# Patient Record
Sex: Female | Born: 1965 | Hispanic: Yes | Marital: Single | Smoking: Never smoker
Health system: Southern US, Community
[De-identification: ages and names within clinical notes are randomized; demographics above are authoritative.]

## PROBLEM LIST (undated history)

## (undated) DIAGNOSIS — I959 Hypotension, unspecified: Secondary | ICD-10-CM

## (undated) DIAGNOSIS — E119 Type 2 diabetes mellitus without complications: Secondary | ICD-10-CM

## (undated) DIAGNOSIS — I10 Essential (primary) hypertension: Secondary | ICD-10-CM

## (undated) DIAGNOSIS — C801 Malignant (primary) neoplasm, unspecified: Secondary | ICD-10-CM

## (undated) HISTORY — PX: ABDOMINAL HYSTERECTOMY: SHX81

---

## 2011-05-04 ENCOUNTER — Emergency Department: Payer: Self-pay | Admitting: Emergency Medicine

## 2011-09-03 ENCOUNTER — Emergency Department: Payer: Self-pay | Admitting: Emergency Medicine

## 2013-06-13 ENCOUNTER — Emergency Department: Payer: Self-pay | Admitting: Internal Medicine

## 2013-06-13 LAB — URINALYSIS, COMPLETE
Bilirubin,UR: NEGATIVE
Blood: NEGATIVE
Glucose,UR: >=500 mg/dL (ref 0–75)
Nitrite: NEGATIVE
Ph: 5 (ref 4.5–8.0)

## 2013-06-14 ENCOUNTER — Emergency Department: Payer: Self-pay | Admitting: Emergency Medicine

## 2013-07-31 ENCOUNTER — Emergency Department: Payer: Self-pay | Admitting: Emergency Medicine

## 2013-07-31 LAB — URINALYSIS, COMPLETE
Bilirubin,UR: NEGATIVE
Blood: NEGATIVE
Glucose,UR: >=500 mg/dL (ref 0–75)
Leukocyte Esterase: NEGATIVE
Nitrite: NEGATIVE
Ph: 6 (ref 4.5–8.0)
Protein: 30
RBC,UR: 1 /HPF (ref 0–5)
Specific Gravity: 1.024 (ref 1.003–1.030)
WBC UR: 4 /HPF (ref 0–5)

## 2013-07-31 LAB — COMPREHENSIVE METABOLIC PANEL
Albumin: 3 g/dL — ABNORMAL LOW (ref 3.4–5.0)
Anion Gap: 5 — ABNORMAL LOW (ref 7–16)
Chloride: 101 mmol/L (ref 98–107)
Co2: 30 mmol/L (ref 21–32)
Creatinine: 0.28 mg/dL — ABNORMAL LOW (ref 0.60–1.30)
EGFR (African American): >60
EGFR (Non-African Amer.): >60
Potassium: 4 mmol/L (ref 3.5–5.1)
SGOT(AST): 12 U/L — ABNORMAL LOW (ref 15–37)
SGPT (ALT): 18 U/L (ref 12–78)
Sodium: 136 mmol/L (ref 136–145)
Total Protein: 7.6 g/dL (ref 6.4–8.2)

## 2013-07-31 LAB — CBC
HCT: 36.5 % (ref 35.0–47.0)
HGB: 12.9 g/dL (ref 12.0–16.0)
MCH: 30.1 pg (ref 26.0–34.0)
MCHC: 35.3 g/dL (ref 32.0–36.0)
Platelet: 437 10*3/uL (ref 150–440)
WBC: 9 10*3/uL (ref 3.6–11.0)

## 2013-07-31 IMAGING — US ABDOMEN ULTRASOUND LIMITED
1 series · 14 of 25 positions shown · non-contrast
Comparison: none

REASON FOR EXAM: RUQ pain
COMMENTS:   Body Site: GB and Fossa, CBD, Head of Pancreas

[Series 1: abdomen ultrasound limited · 0.33mm/px · 14 of 62 slices shown]
[im 1/62]
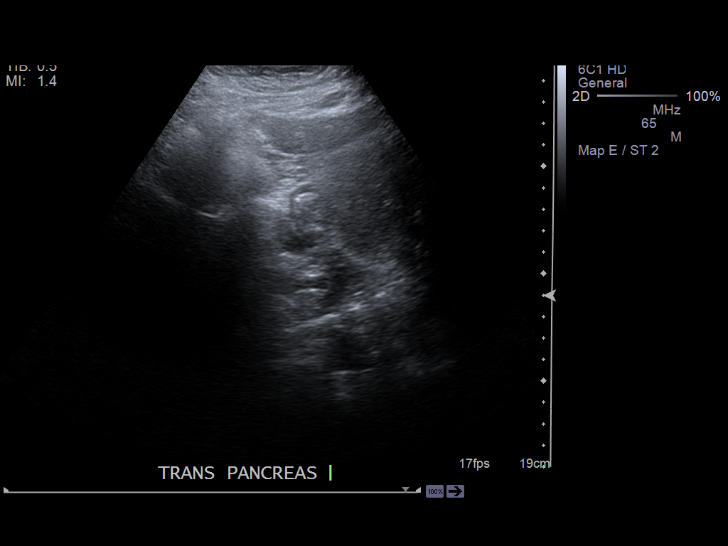
[im 6/62]
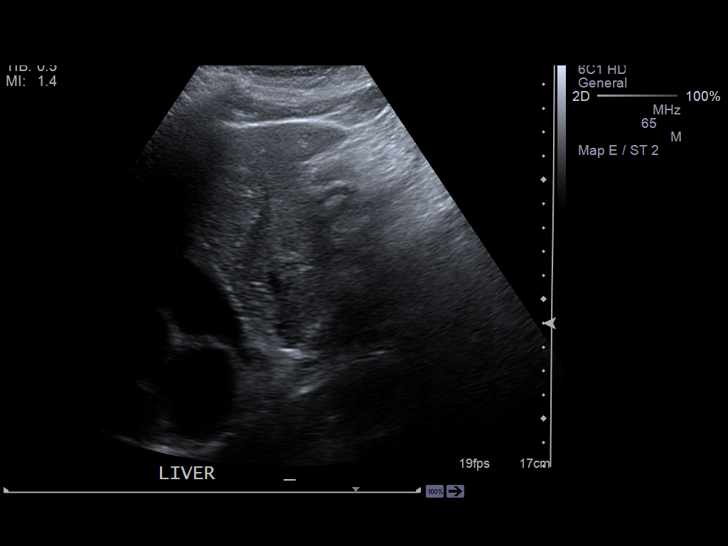
[im 11/62]
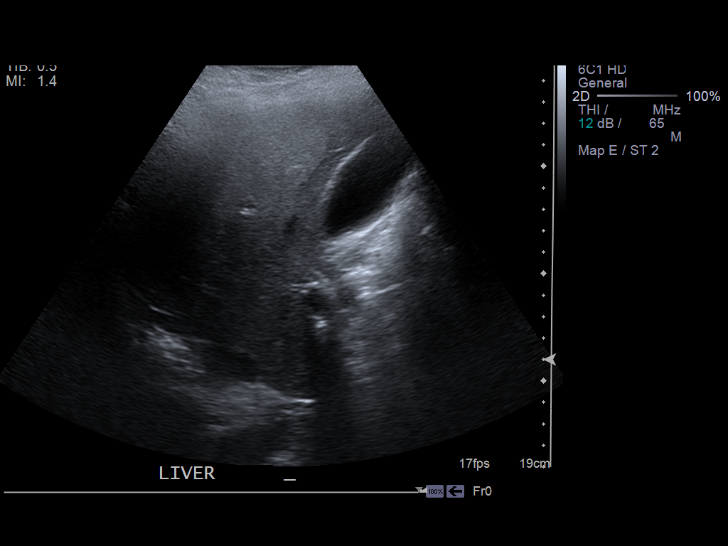
[im 16/62]
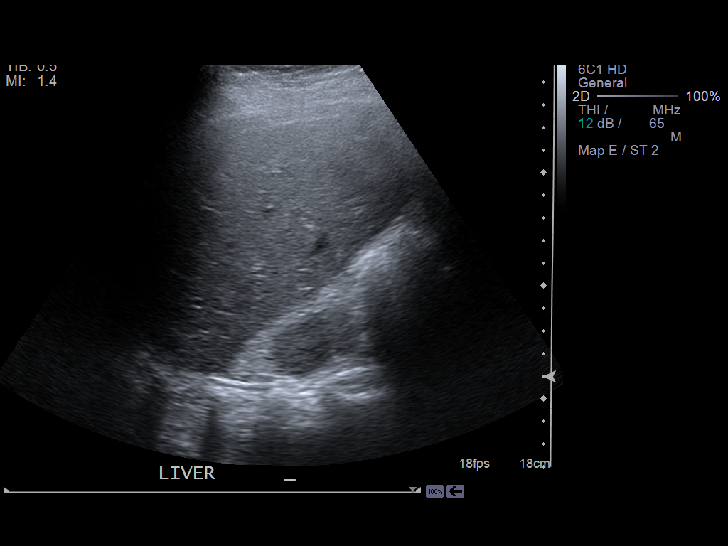
[im 21/62]
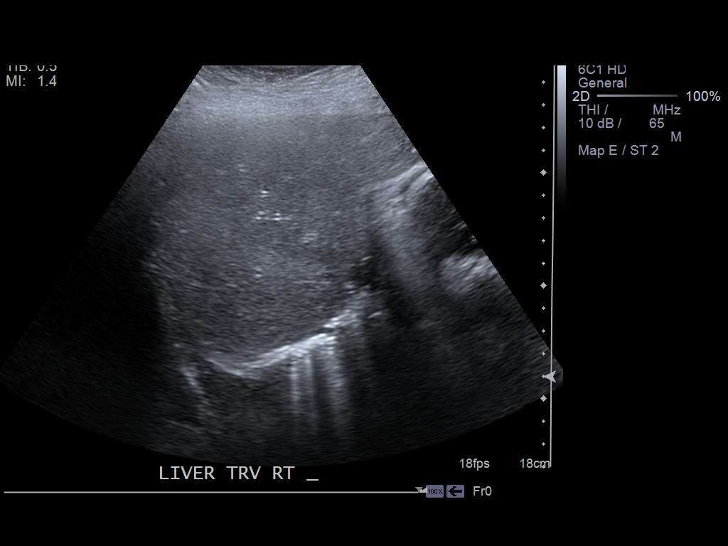
[im 23/62]
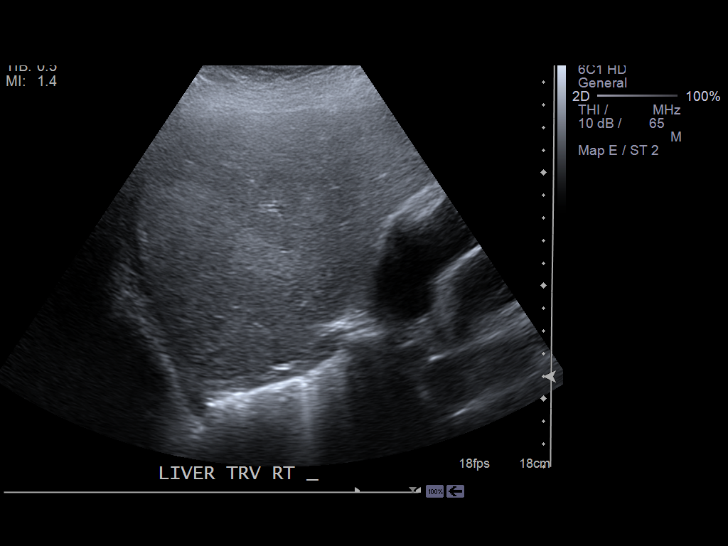
[im 28/62]
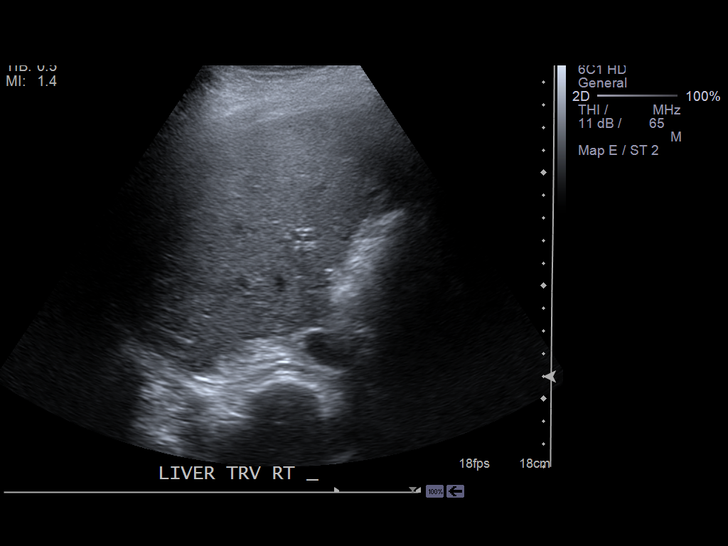
[im 34/62]
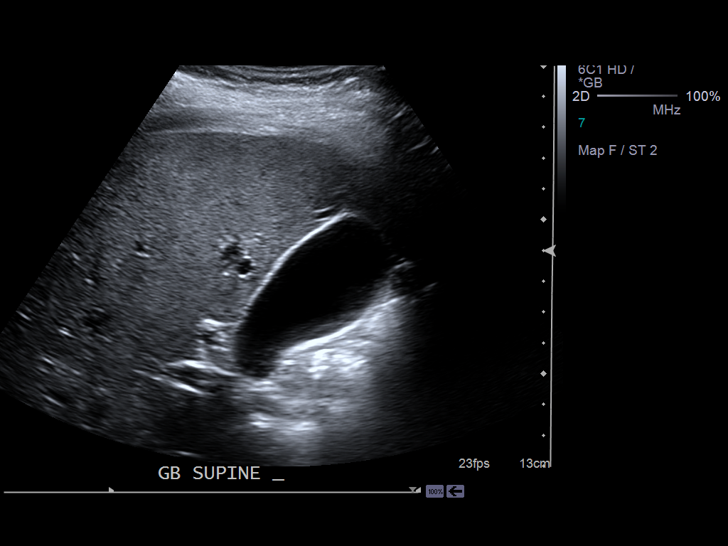
[im 39/62]
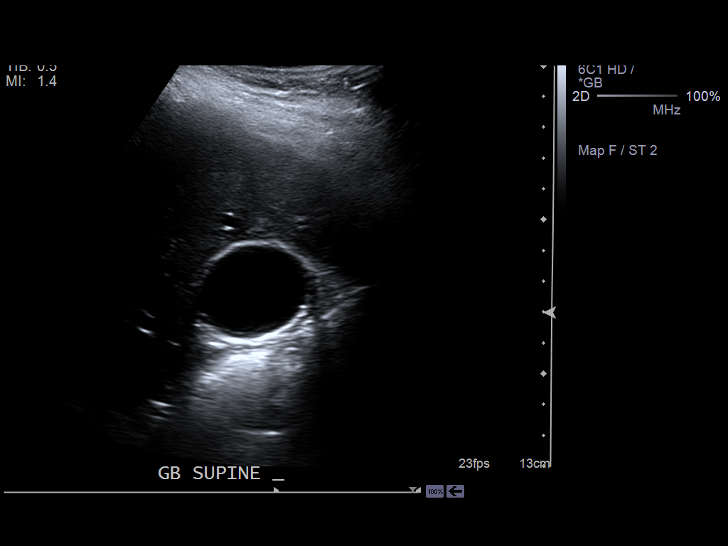
[im 41/62]
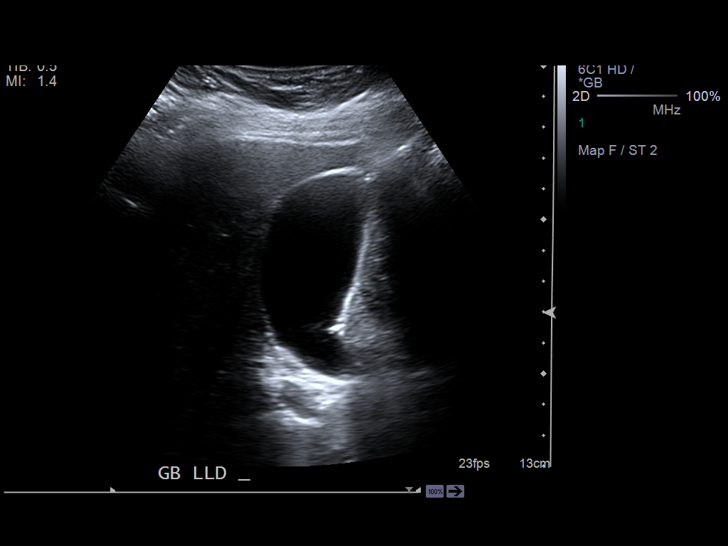
[im 46/62]
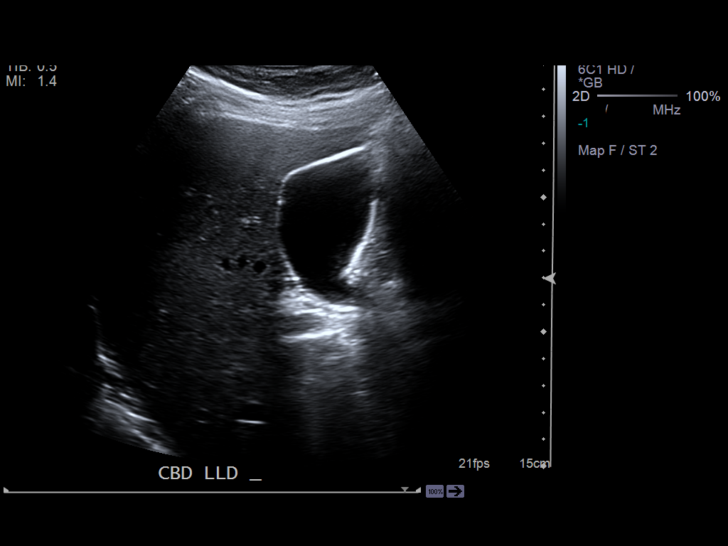
[im 51/62]
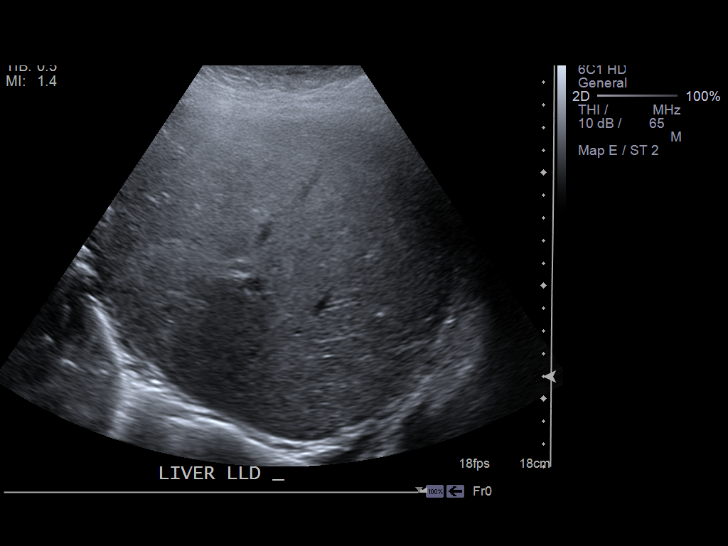
[im 56/62]
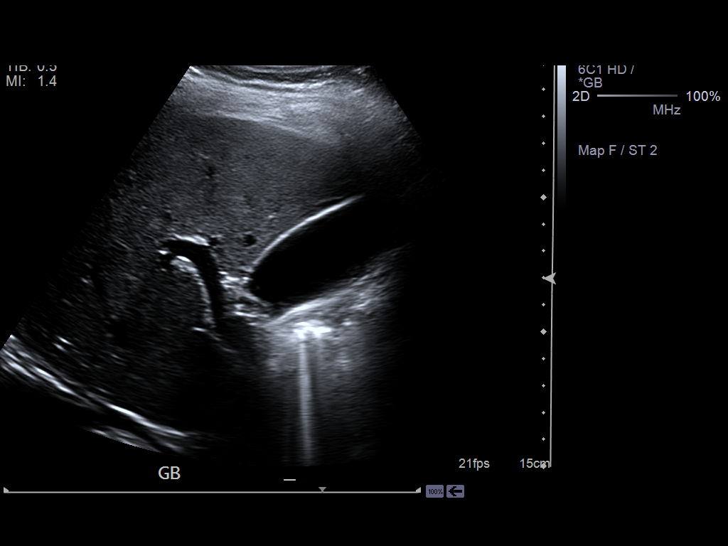
[im 62/62]
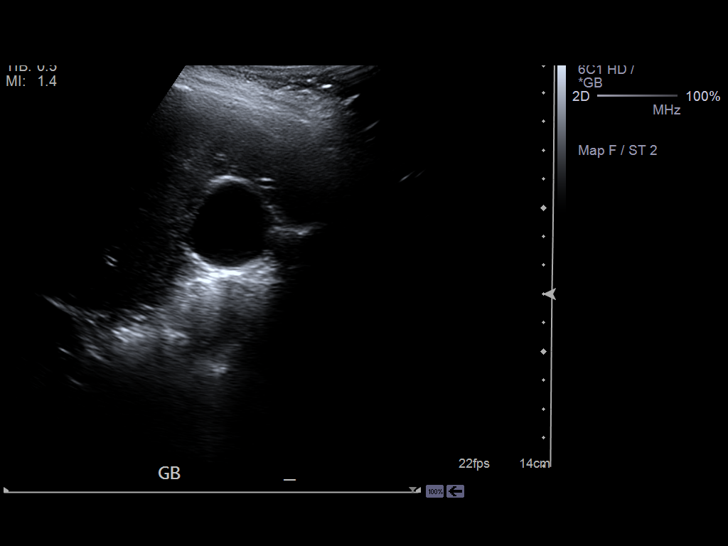

[14 of 25 positions shown; findings below may reference images not displayed]

PROCEDURE:     US  - US ABDOMEN LIMITED SURVEY  - July 31, 2013 [DATE]

RESULT:     A limited upper abdominal ultrasound was performed.

The liver exhibits normal echotexture with no focal mass or ductal dilation.
Portal venous flow is normal in direction toward the liver. Bowel gas limits
evaluation of the pancreas but the visualized portions of the pancreas
appear normal. The gallbladder is adequately distended with no evidence of
stones, wall thickening, or pericholecystic fluid. There is no positive
sonographic Murphy's sign. The common bile duct is normal at 2.3 mm in
diameter. There is a small right pleural effusion.
IMPRESSION: 1. The liver and gallbladder are normal in appearance. The common bile duct
is normal in caliber.
2. There is limited evaluation of the pancreas.
3. There is a small right pleural effusion.

[REDACTED]

## 2013-07-31 IMAGING — CR DG CHEST 2V
1 series · 2 of 2 positions shown · non-contrast
Comparison: none

REASON FOR EXAM: cough
COMMENTS:

[Series 1: pa · 0.17mm/px · 2 of 2 slices shown]
[im 1/2]
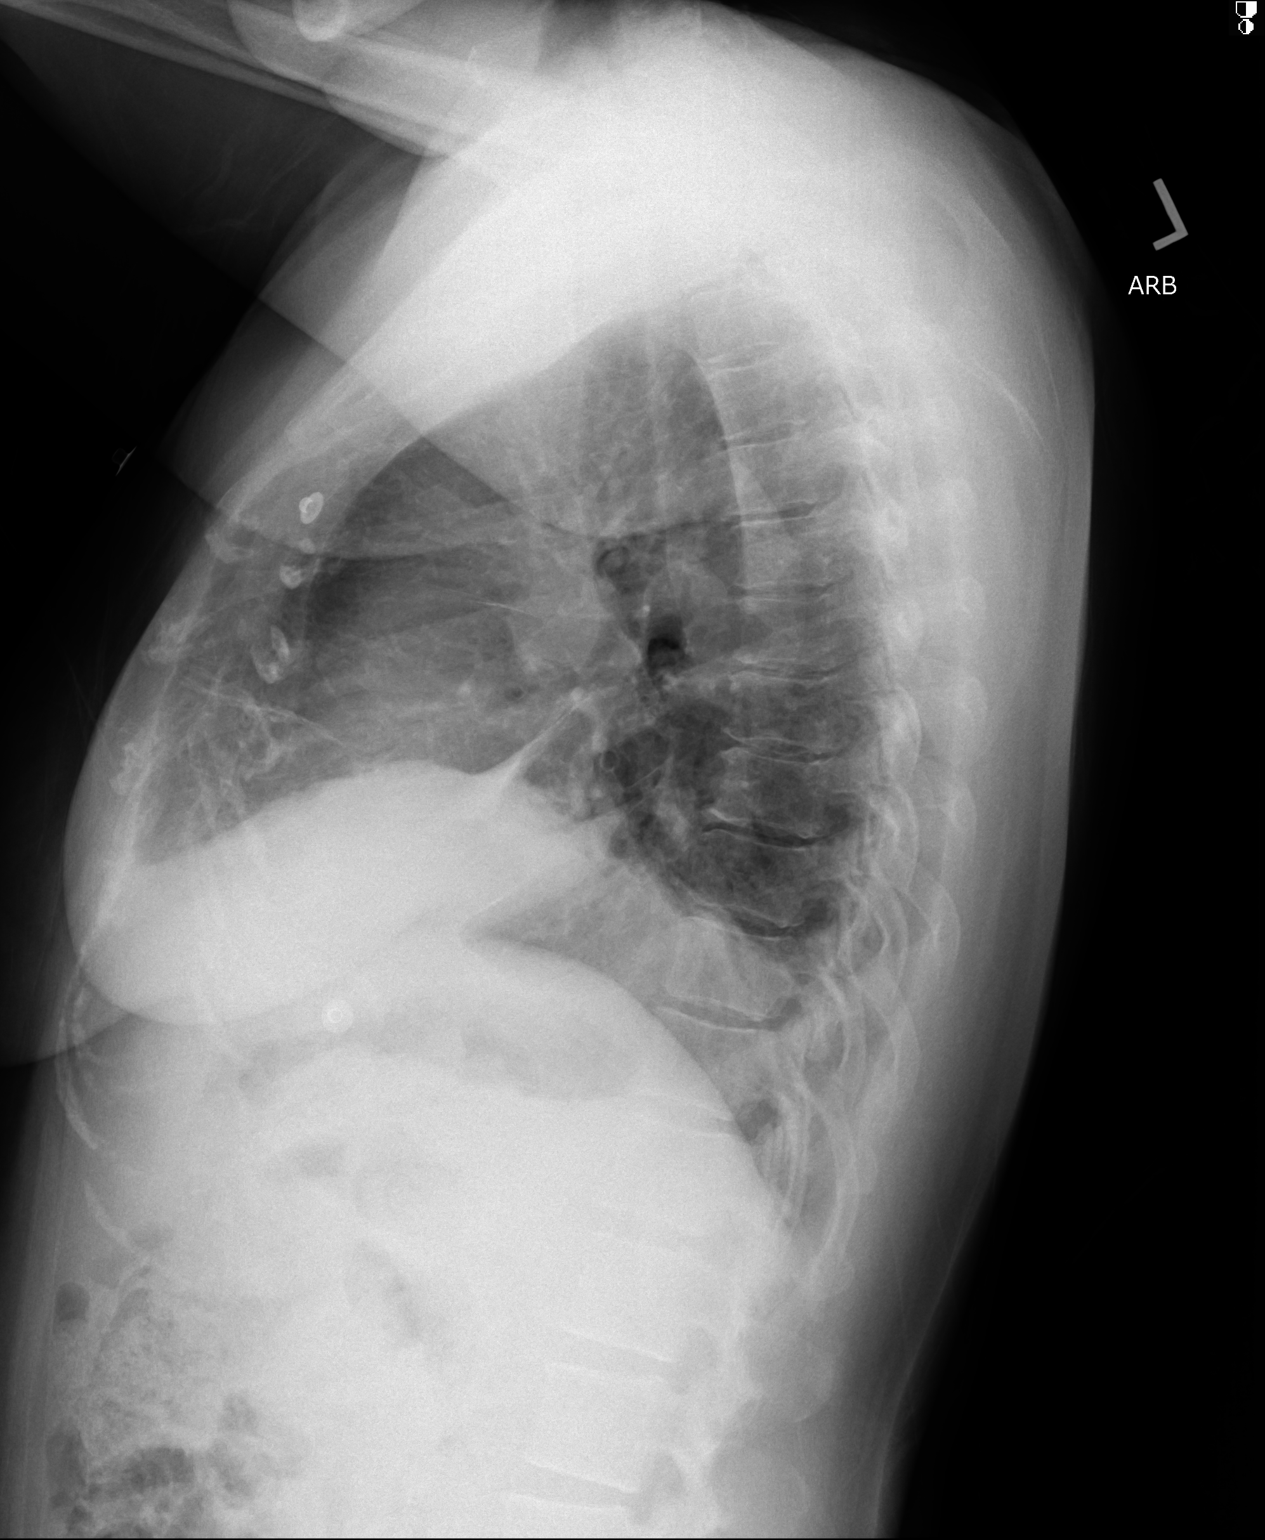
[im 2/2]
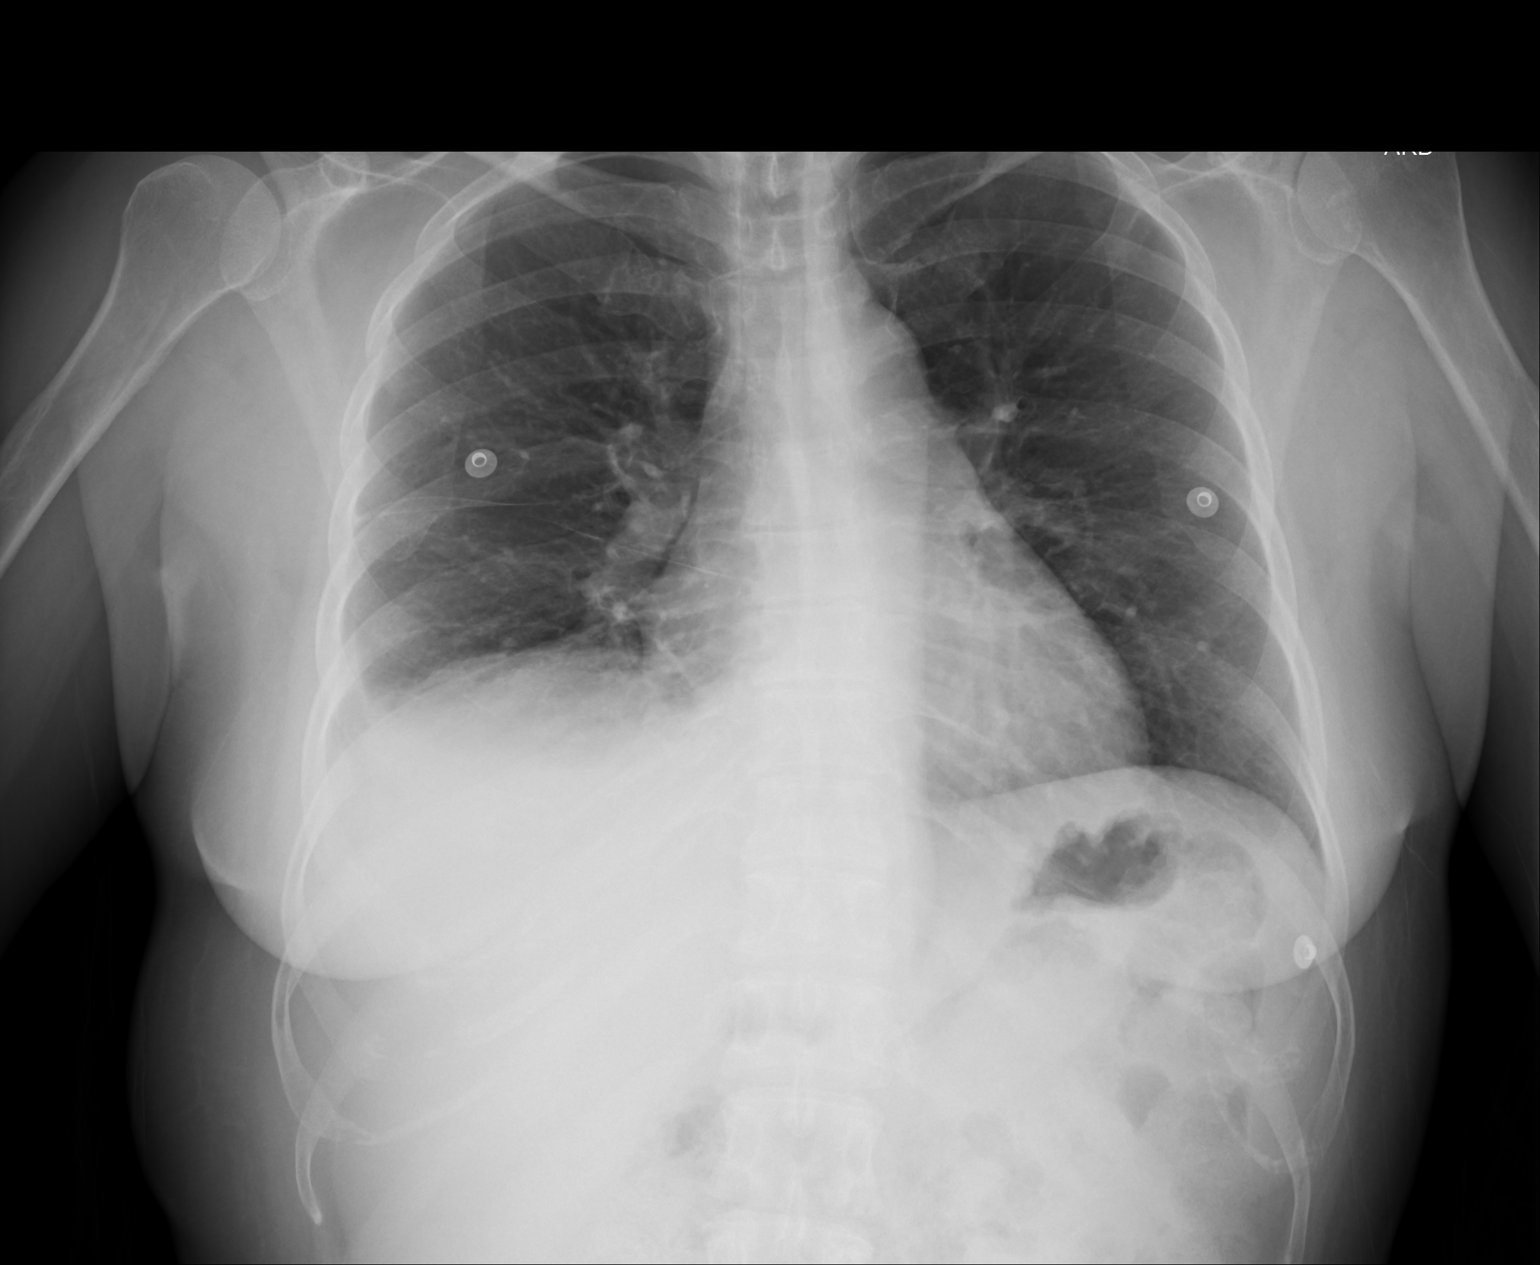

[2 of 2 positions shown; findings below may reference images not displayed]

PROCEDURE:     DXR - DXR CHEST PA (OR AP) AND LATERAL  - July 31, 2013 [DATE]

RESULT:     The left lung is clear. On the right there is increased density
at the lung base with blunting of the costophrenic gutters. The cardiac
silhouette is normal in size. The pulmonary vascularity is not engorged. The
observed portions of the bony thorax appear normal.
IMPRESSION: The findings are consistent with a small right pleural
effusion. I cannot exclude left basilar atelectasis or pneumonia. Followup
films following therapy are recommended. Chest CT scanning may be useful if
there are clinical concerns possible acute pulmonary embolism.

[REDACTED]

## 2013-08-03 ENCOUNTER — Emergency Department: Payer: Self-pay | Admitting: Emergency Medicine

## 2013-08-03 IMAGING — CR DG CHEST 2V
1 series · 2 of 2 positions shown · non-contrast
Comparison: none

REASON FOR EXAM: recent pleural effusion - eval for change - ongoing R
chest pain.
COMMENTS:

PROCEDURE:     DXR - DXR CHEST PA (OR AP) AND LATERAL  - August 03, 2013  [DATE]
RESULT:     Comparison: None

[Series 1: pa · 0.17mm/px · 2 of 2 slices shown]
[im 1/2]
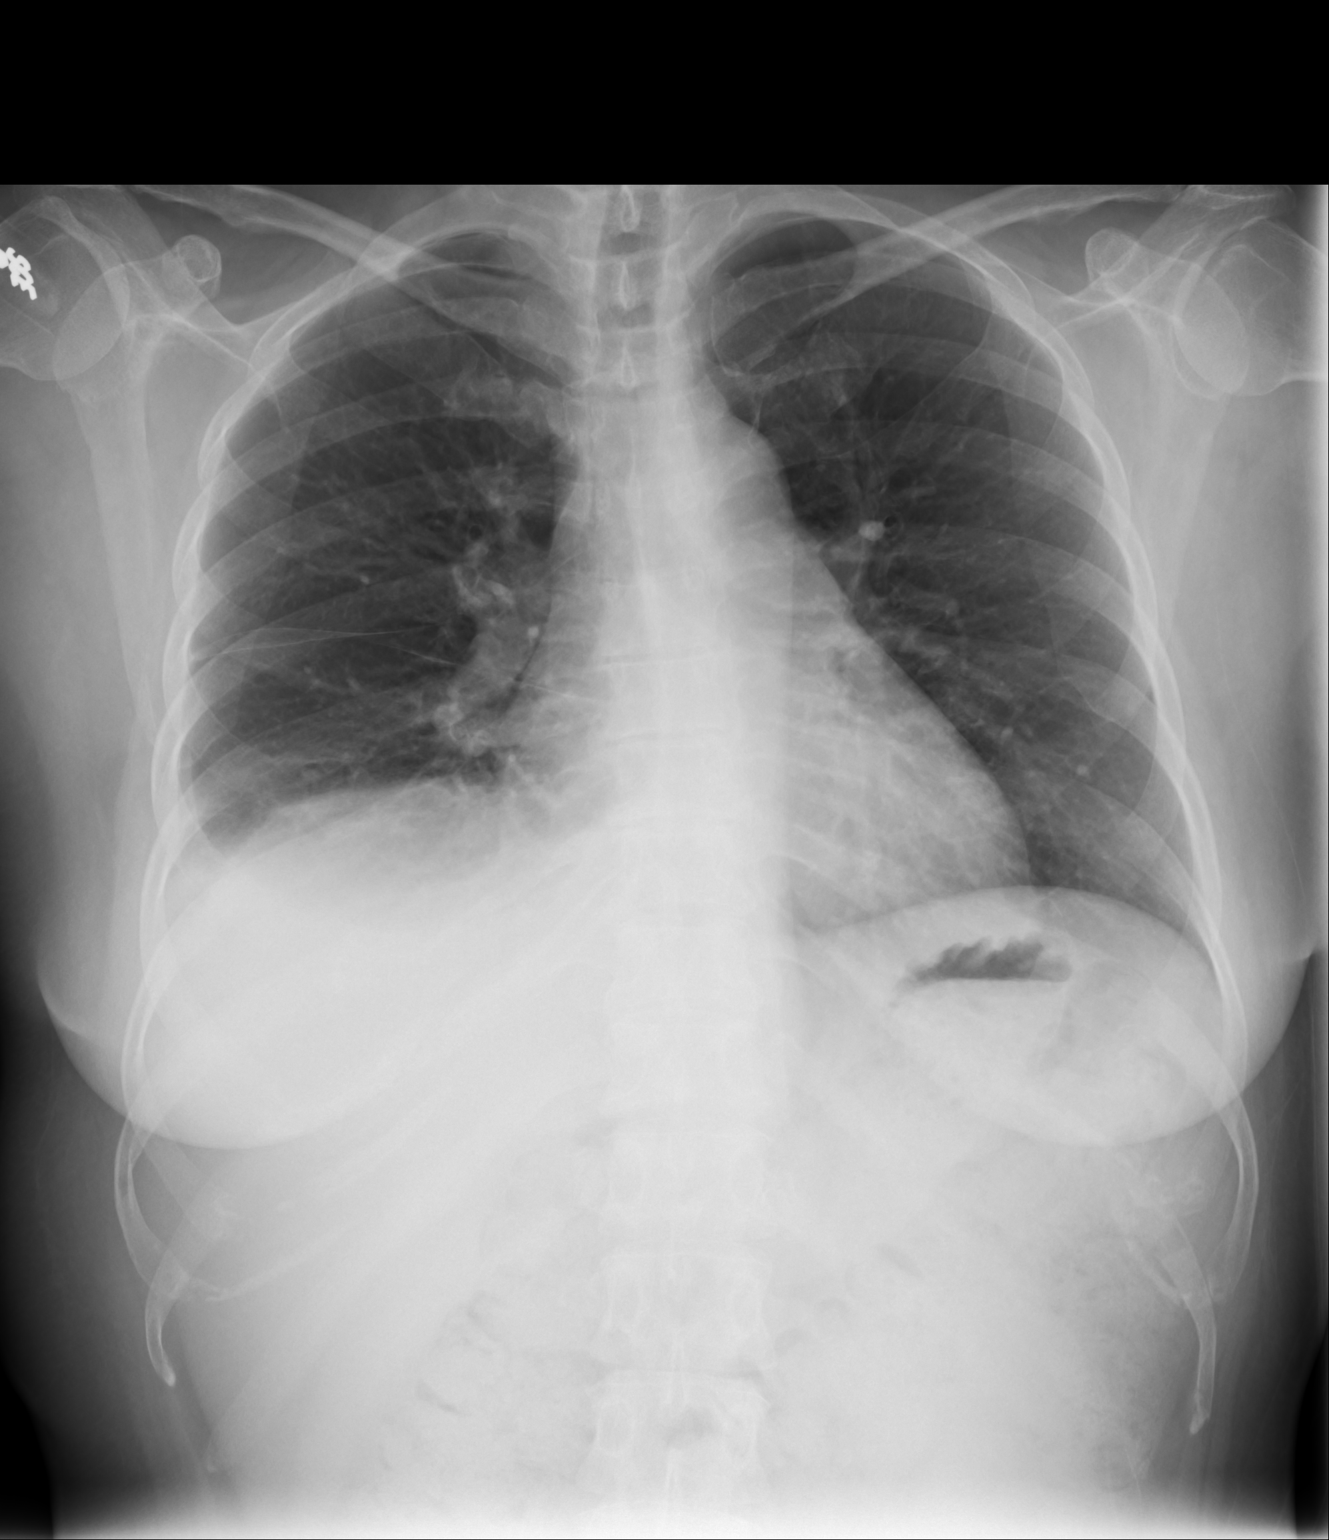
[im 2/2]
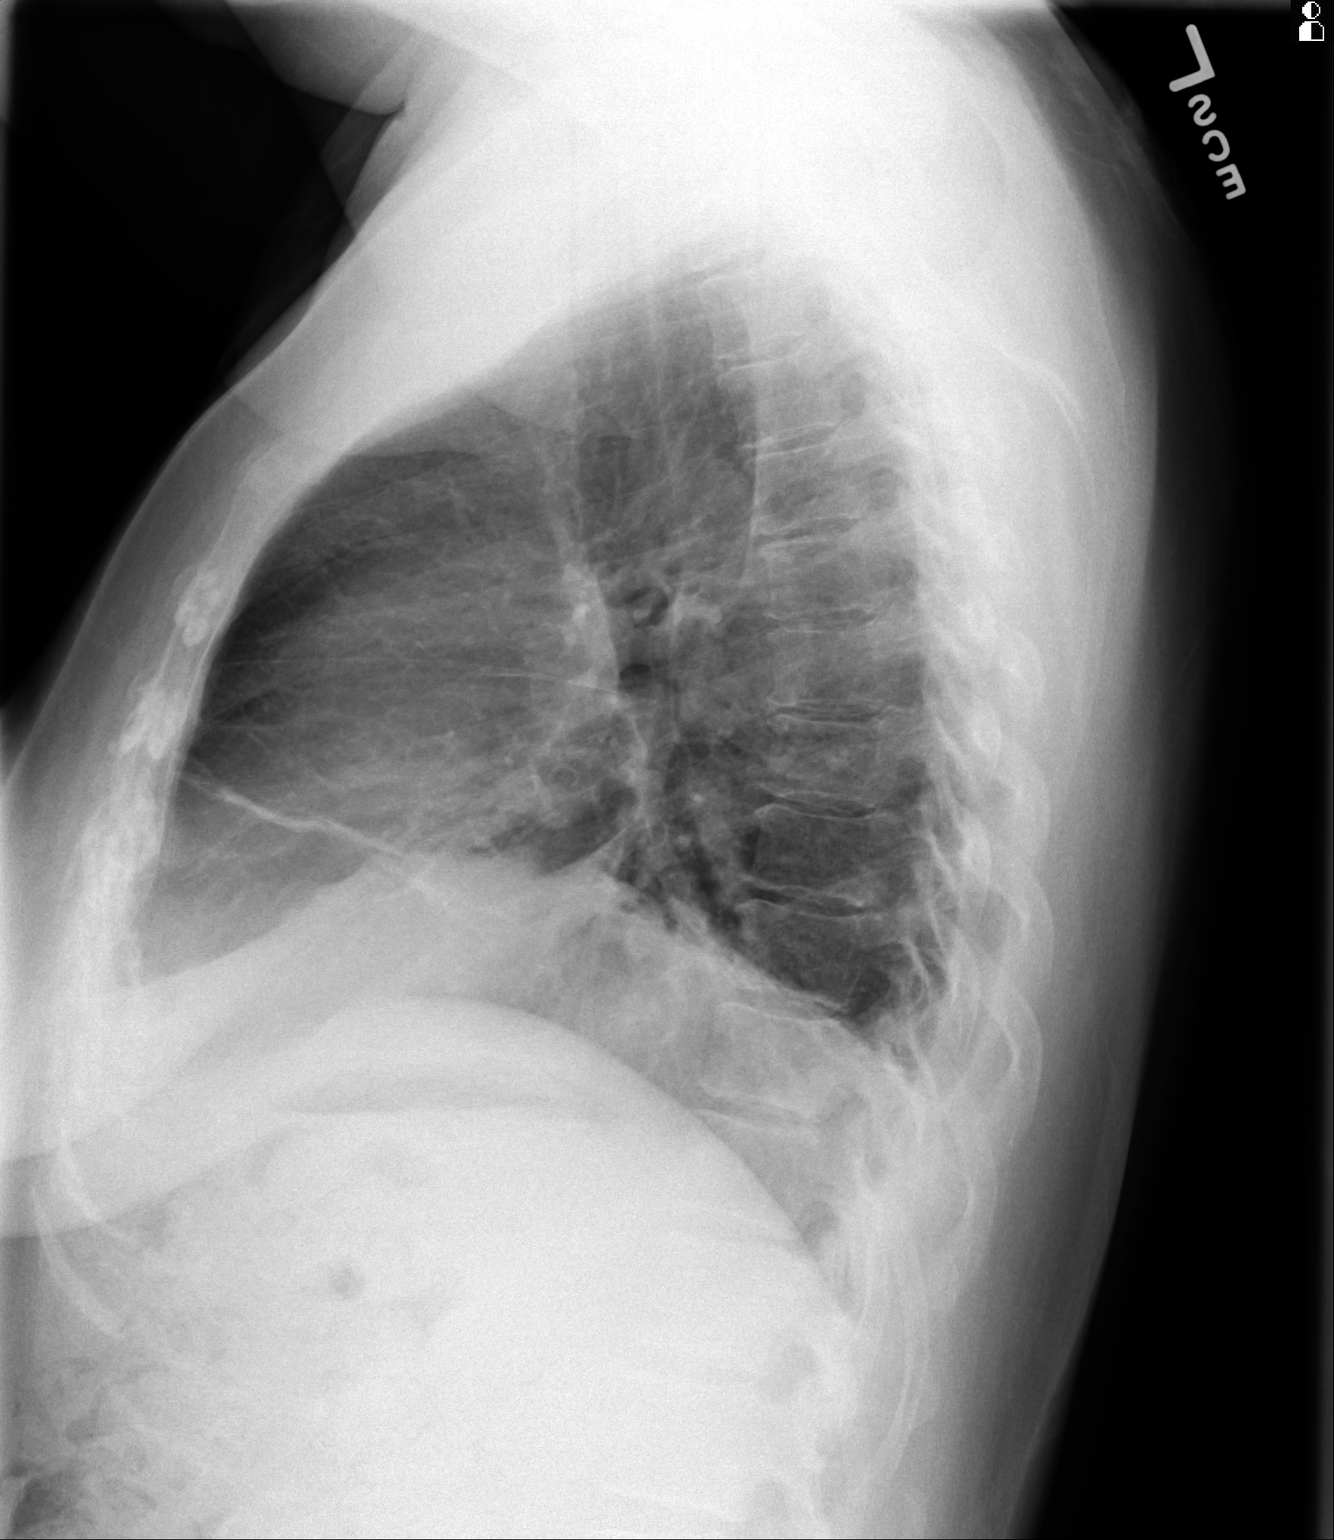

[2 of 2 positions shown; findings below may reference images not displayed]

FINDINGS: PA and lateral chest radiographs are provided.  There is elevation of the
right diaphragm and blunting of the right costophrenic angle likely
atelectasis. There is no focal parenchymal opacity, pleural effusion, or
pneumothorax. The heart and mediastinum are unremarkable.  The osseous
structures are unremarkable.
IMPRESSION: No acute disease of the che[REDACTED]

## 2016-02-14 ENCOUNTER — Inpatient Hospital Stay
Admission: EM | Admit: 2016-02-14 | Discharge: 2016-02-17 | DRG: 871 | Disposition: A | Discharge status: Home / Self Care | Payer: Self-pay | Attending: Specialist | Admitting: Specialist

## 2016-02-14 ENCOUNTER — Encounter: Payer: Self-pay | Admitting: Emergency Medicine

## 2016-02-14 ENCOUNTER — Emergency Department: Payer: Self-pay

## 2016-02-14 DIAGNOSIS — C9000 Multiple myeloma not having achieved remission: Secondary | ICD-10-CM | POA: Diagnosis present

## 2016-02-14 DIAGNOSIS — N390 Urinary tract infection, site not specified: Secondary | ICD-10-CM | POA: Diagnosis present

## 2016-02-14 DIAGNOSIS — A419 Sepsis, unspecified organism: Principal | ICD-10-CM | POA: Diagnosis present

## 2016-02-14 DIAGNOSIS — I10 Essential (primary) hypertension: Secondary | ICD-10-CM | POA: Diagnosis present

## 2016-02-14 DIAGNOSIS — J189 Pneumonia, unspecified organism: Secondary | ICD-10-CM | POA: Diagnosis present

## 2016-02-14 DIAGNOSIS — Z794 Long term (current) use of insulin: Secondary | ICD-10-CM

## 2016-02-14 DIAGNOSIS — E119 Type 2 diabetes mellitus without complications: Secondary | ICD-10-CM | POA: Diagnosis present

## 2016-02-14 HISTORY — DX: Type 2 diabetes mellitus without complications: E11.9

## 2016-02-14 HISTORY — DX: Malignant (primary) neoplasm, unspecified: C80.1

## 2016-02-14 HISTORY — DX: Hypotension, unspecified: I95.9

## 2016-02-14 LAB — URINALYSIS COMPLETE WITH MICROSCOPIC (ARMC ONLY)
Bilirubin Urine: NEGATIVE
Glucose, UA: >500 mg/dL — AB
HGB URINE DIPSTICK: NEGATIVE
LEUKOCYTES UA: NEGATIVE
NITRITE: NEGATIVE
PH: 5 (ref 5.0–8.0)
PROTEIN: 100 mg/dL — AB
SPECIFIC GRAVITY, URINE: 1.023 (ref 1.005–1.030)

## 2016-02-14 LAB — BASIC METABOLIC PANEL
Anion gap: 15 (ref 5–15)
BUN: 16 mg/dL (ref 6–20)
CALCIUM: 8.5 mg/dL — AB (ref 8.9–10.3)
CHLORIDE: 92 mmol/L — AB (ref 101–111)
CO2: 25 mmol/L (ref 22–32)
CREATININE: 0.5 mg/dL (ref 0.44–1.00)
GFR calc non Af Amer: >60 mL/min (ref >60)
Glucose, Bld: 339 mg/dL — ABNORMAL HIGH (ref 65–99)
Potassium: 4.1 mmol/L (ref 3.5–5.1)
SODIUM: 132 mmol/L — AB (ref 135–145)

## 2016-02-14 LAB — CBC WITH DIFFERENTIAL/PLATELET
BASOS PCT: 0 %
Basophils Absolute: 0 10*3/uL (ref 0–0.1)
EOS ABS: 0 10*3/uL (ref 0–0.7)
Eosinophils Relative: 0 %
HCT: 32.1 % — ABNORMAL LOW (ref 35.0–47.0)
HEMOGLOBIN: 10.6 g/dL — AB (ref 12.0–16.0)
LYMPHS ABS: 0.8 10*3/uL — AB (ref 1.0–3.6)
Lymphocytes Relative: 4 %
MCH: 28.3 pg (ref 26.0–34.0)
MCHC: 33.1 g/dL (ref 32.0–36.0)
MCV: 85.6 fL (ref 80.0–100.0)
MONO ABS: 0.9 10*3/uL (ref 0.2–0.9)
MONOS PCT: 4 %
Neutro Abs: 19 10*3/uL — ABNORMAL HIGH (ref 1.4–6.5)
Neutrophils Relative %: 92 %
Platelets: 376 10*3/uL (ref 150–440)
RBC: 3.75 MIL/uL — ABNORMAL LOW (ref 3.80–5.20)
RDW: 15.4 % — AB (ref 11.5–14.5)
WBC: 20.7 10*3/uL — ABNORMAL HIGH (ref 3.6–11.0)

## 2016-02-14 LAB — RAPID INFLUENZA A&B ANTIGENS (ARMC ONLY): INFLUENZA A (ARMC): NOT DETECTED

## 2016-02-14 LAB — LACTIC ACID, PLASMA
LACTIC ACID, VENOUS: 1.1 mmol/L (ref 0.5–2.0)
LACTIC ACID, VENOUS: 0.7 mmol/L (ref 0.5–2.0)

## 2016-02-14 LAB — RAPID INFLUENZA A&B ANTIGENS: Influenza B (ARMC): NOT DETECTED

## 2016-02-14 IMAGING — CR DG CHEST 2V
1 series · 2 of 2 positions shown · non-contrast
Comparison: August 03, 2013

CLINICAL DATA: Nausea and vomiting.  Fever.

EXAM:
CHEST  2 VIEW

[Series 1: dg chest 2 view · 0.14mm/px · 2 of 2 slices shown]
[im 1/2]
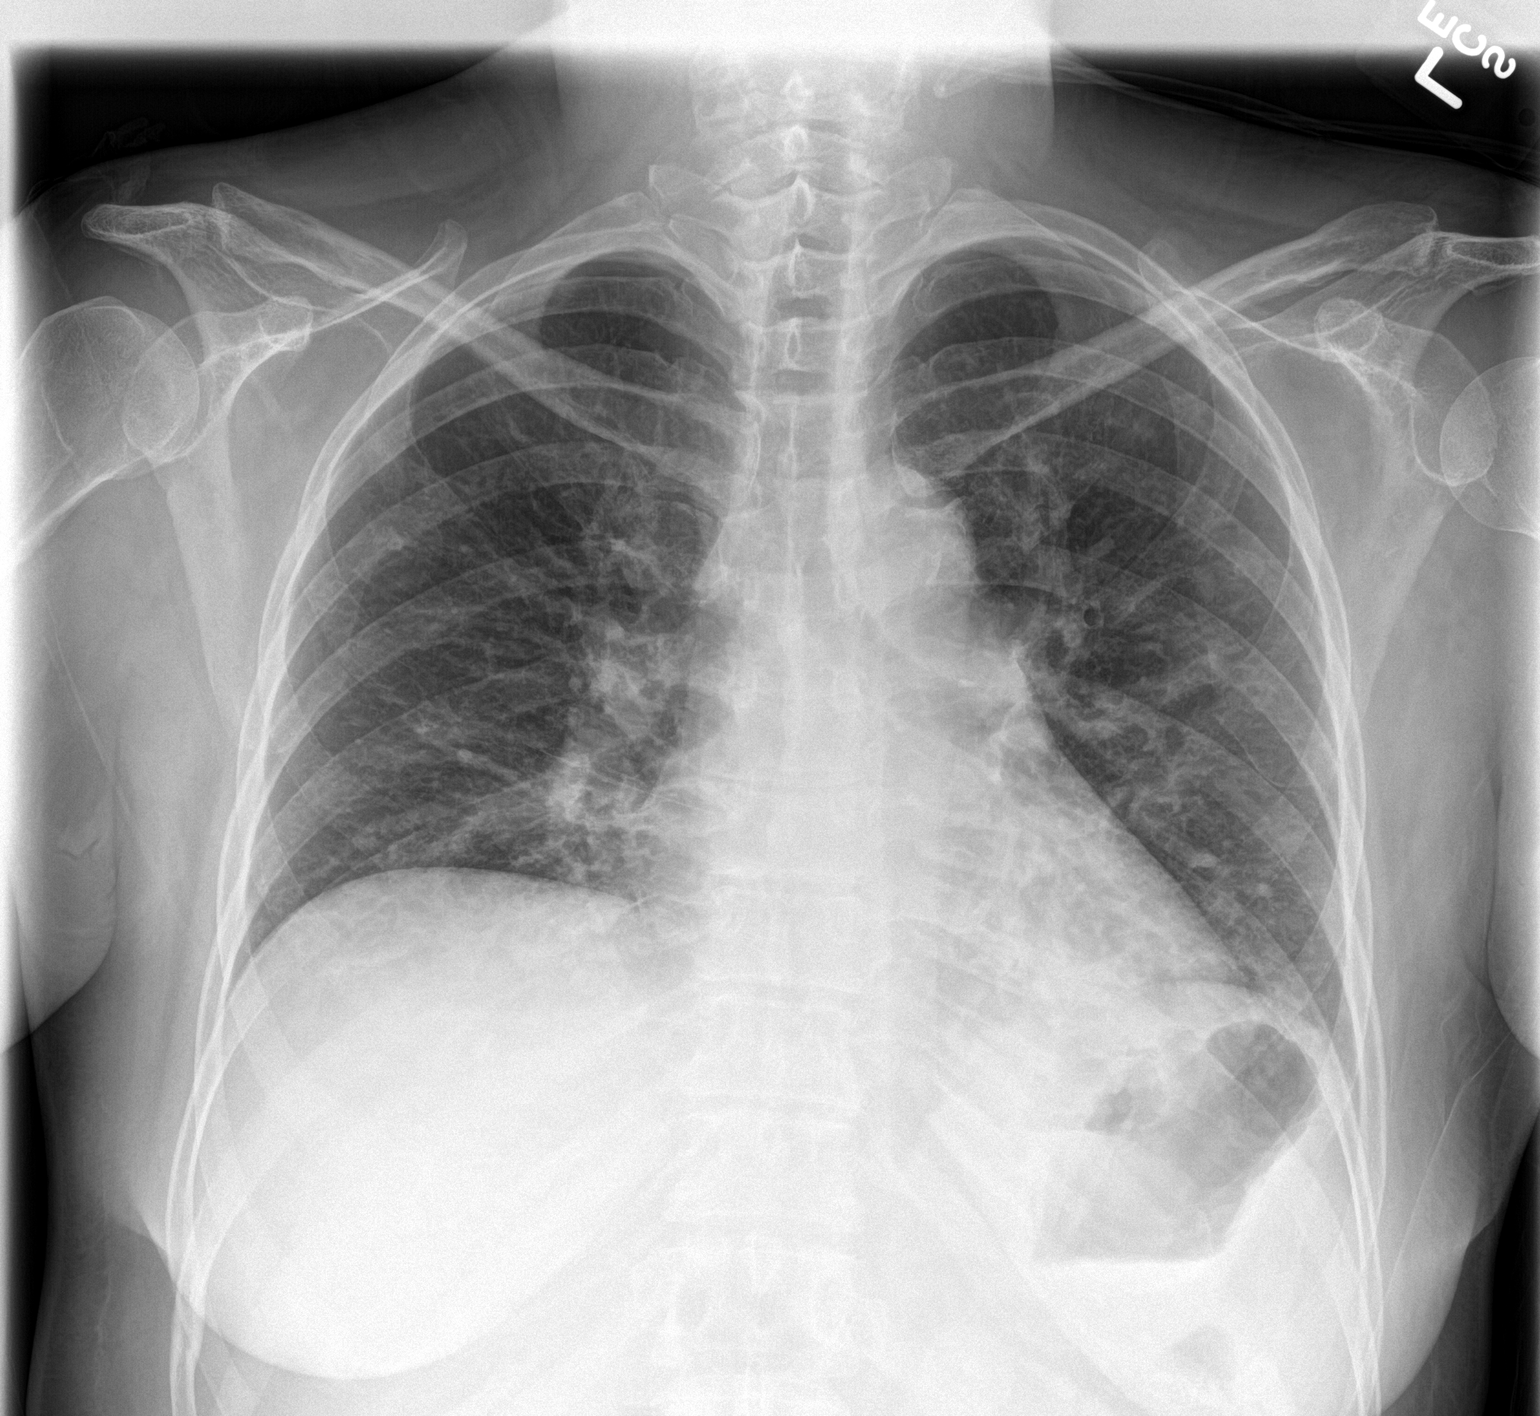
[im 2/2]
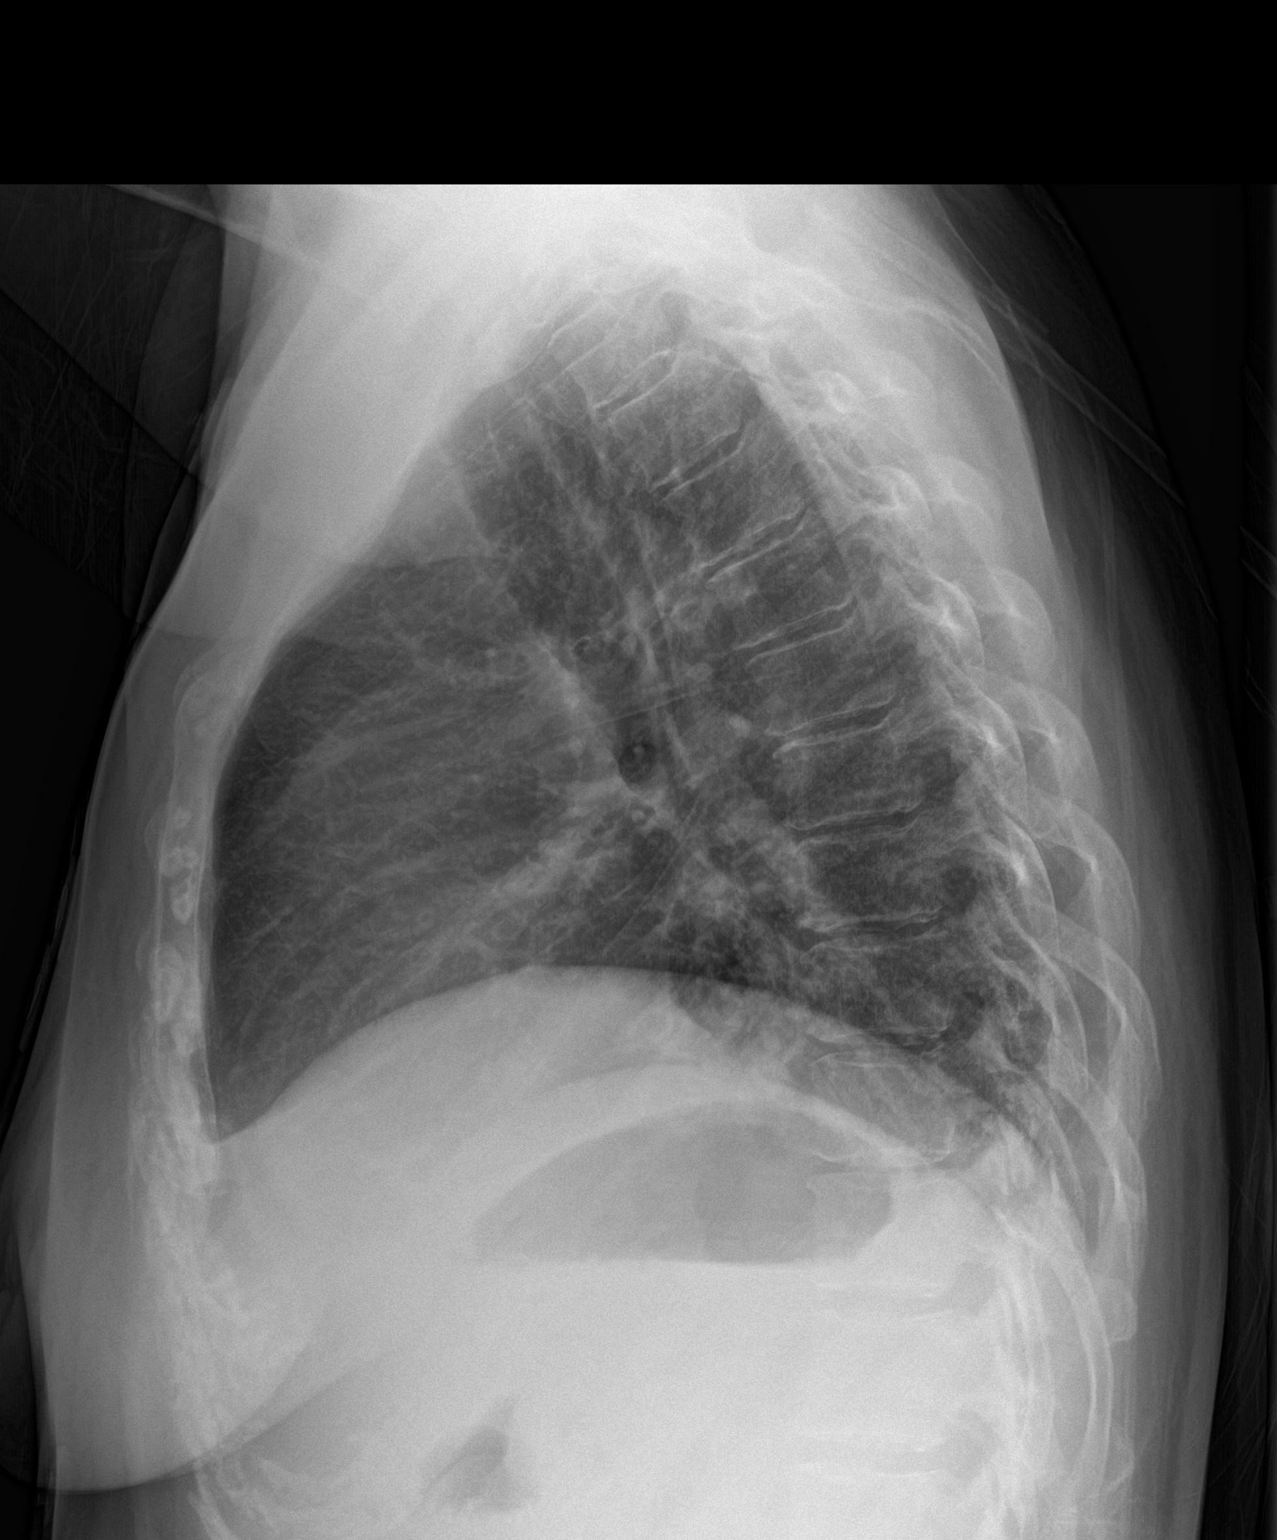

[2 of 2 positions shown; findings below may reference images not displayed]

FINDINGS: No pneumothorax. The heart size remains borderline. The hila and
mediastinum are unremarkable. Mild patchy opacity in the left mid
lung in the retrocardiac region, best seen on the frontal view. The
findings likely represent patchy pneumonia given history. Suggestion
of a small nodule in the right mid lung laterally. This may be
calcified. No other acute abnormalities.
IMPRESSION: 1. Patchy opacity in the left mid and lower lung. Recommend
follow-up to resolution.
2. Possible nodule, perhaps calcified, in the right mid lung.
Recommend attention on follow-up.

## 2016-02-14 MED ORDER — ONDANSETRON HCL 4 MG/2ML IJ SOLN
4.0000 mg | Freq: Four times a day (QID) | INTRAMUSCULAR | Status: DC | PRN
  Administered 2016-02-16: 06:00:00 4 mg via INTRAVENOUS
  Filled 2016-02-14: qty 2

## 2016-02-14 MED ORDER — ONDANSETRON HCL 4 MG PO TABS: 4.0000 mg | ORAL_TABLET | Freq: Four times a day (QID) | ORAL | Status: DC | PRN

## 2016-02-14 MED ORDER — OXYCODONE HCL 5 MG PO TABS: 5.0000 mg | ORAL_TABLET | Freq: Three times a day (TID) | ORAL | Status: DC | PRN

## 2016-02-14 MED ORDER — PIPERACILLIN-TAZOBACTAM 4.5 G IVPB
4.5000 g | Freq: Three times a day (TID) | INTRAVENOUS | Status: DC
  Administered 2016-02-15: 4.5 g via INTRAVENOUS
  Filled 2016-02-14 (×3): qty 100

## 2016-02-14 MED ORDER — IPRATROPIUM-ALBUTEROL 0.5-2.5 (3) MG/3ML IN SOLN: 3.0000 mL | RESPIRATORY_TRACT | Status: DC | PRN

## 2016-02-14 MED ORDER — INSULIN ASPART 100 UNIT/ML ~~LOC~~ SOLN
0.0000 [IU] | Freq: Three times a day (TID) | SUBCUTANEOUS | Status: DC
  Administered 2016-02-15 (×3): 5 [IU] via SUBCUTANEOUS
  Administered 2016-02-15: 2 [IU] via SUBCUTANEOUS
  Administered 2016-02-16: 18:00:00 7 [IU] via SUBCUTANEOUS
  Administered 2016-02-16 (×2): 5 [IU] via SUBCUTANEOUS
  Administered 2016-02-16: 08:00:00 3 [IU] via SUBCUTANEOUS
  Administered 2016-02-17: 1 [IU] via SUBCUTANEOUS
  Administered 2016-02-17: 7 [IU] via SUBCUTANEOUS
  Filled 2016-02-14: qty 1
  Filled 2016-02-14 (×2): qty 5
  Filled 2016-02-14: qty 7
  Filled 2016-02-14: qty 5
  Filled 2016-02-14: qty 7
  Filled 2016-02-14: qty 1
  Filled 2016-02-14: qty 2
  Filled 2016-02-14: qty 5
  Filled 2016-02-14: qty 3
  Filled 2016-02-14: qty 1
  Filled 2016-02-14: qty 5

## 2016-02-14 MED ORDER — VANCOMYCIN HCL IN DEXTROSE 1-5 GM/200ML-% IV SOLN
1000.0000 mg | Freq: Once | INTRAVENOUS | Status: AC
  Administered 2016-02-14: 1000 mg via INTRAVENOUS
  Filled 2016-02-14: qty 200

## 2016-02-14 MED ORDER — VANCOMYCIN HCL IN DEXTROSE 750-5 MG/150ML-% IV SOLN
750.0000 mg | Freq: Two times a day (BID) | INTRAVENOUS | Status: DC
  Administered 2016-02-15: 11:00:00 750 mg via INTRAVENOUS
  Filled 2016-02-14 (×2): qty 150

## 2016-02-14 MED ORDER — SODIUM CHLORIDE 0.9 % IV BOLUS (SEPSIS)
1000.0000 mL | Freq: Once | INTRAVENOUS | Status: AC
  Administered 2016-02-14: 1000 mL via INTRAVENOUS

## 2016-02-14 MED ORDER — ACETAMINOPHEN 650 MG RE SUPP
650.0000 mg | Freq: Four times a day (QID) | RECTAL | Status: DC | PRN
Start: 2016-02-14 — End: 2016-02-17

## 2016-02-14 MED ORDER — PIPERACILLIN-TAZOBACTAM 3.375 G IVPB 30 MIN
3.3750 g | Freq: Once | INTRAVENOUS | Status: AC
  Administered 2016-02-14: 3.375 g via INTRAVENOUS
  Filled 2016-02-14: qty 50

## 2016-02-14 MED ORDER — SODIUM CHLORIDE 0.9% FLUSH
3.0000 mL | Freq: Two times a day (BID) | INTRAVENOUS | Status: DC
  Administered 2016-02-14 – 2016-02-16 (×5): 3 mL via INTRAVENOUS

## 2016-02-14 MED ORDER — ACETAMINOPHEN 500 MG PO TABS
1000.0000 mg | ORAL_TABLET | Freq: Once | ORAL | Status: AC
  Administered 2016-02-14: 1000 mg via ORAL

## 2016-02-14 MED ORDER — ACETAMINOPHEN 500 MG PO TABS: 1000.0000 mg | ORAL_TABLET | Freq: Once | ORAL | Status: DC

## 2016-02-14 MED ORDER — ASPIRIN EC 81 MG PO TBEC
81.0000 mg | DELAYED_RELEASE_TABLET | Freq: Every day | ORAL | Status: DC
  Administered 2016-02-15 – 2016-02-17 (×3): 81 mg via ORAL
  Filled 2016-02-14 (×3): qty 1

## 2016-02-14 MED ORDER — SODIUM CHLORIDE 0.9 % IV SOLN
INTRAVENOUS | Status: AC
  Administered 2016-02-14: via INTRAVENOUS

## 2016-02-14 MED ORDER — ACETAMINOPHEN 500 MG PO TABS
ORAL_TABLET | ORAL | Status: AC
  Administered 2016-02-14: 1000 mg via ORAL
  Filled 2016-02-14: qty 2

## 2016-02-14 MED ORDER — ACETAMINOPHEN 325 MG PO TABS
650.0000 mg | ORAL_TABLET | Freq: Four times a day (QID) | ORAL | Status: DC | PRN
Start: 2016-02-14 — End: 2016-02-17
  Administered 2016-02-14 – 2016-02-16 (×3): 650 mg via ORAL
  Filled 2016-02-14 (×3): qty 2

## 2016-02-14 MED ORDER — ENOXAPARIN SODIUM 40 MG/0.4ML ~~LOC~~ SOLN
40.0000 mg | SUBCUTANEOUS | Status: DC
  Administered 2016-02-15 – 2016-02-16 (×2): 40 mg via SUBCUTANEOUS
  Filled 2016-02-14 (×2): qty 0.4

## 2016-02-14 NOTE — ED Provider Notes (Signed)
Time Seen: Approximately 1800  I have reviewed the triage notes  Chief Complaint: Emesis and Nausea   History of Present Illness: Ruth Walters is a 50 y.o. female *who is exclusively Spanish-speaking patient. History and review of systems is taken through interpreter. The patient states that she was recently diagnosed as having influenza. She is currently taking an antibiotic amoxicillin for a dental infection. She states she's had some generalized weakness with persistent nausea and vomiting over the last 15 hours. She denies any loose stool. She states she has a history of multiple myeloma and is currently receiving treatment once a [facility] at [facility]. The patient does not have a Port-A-Cath. Patient states she has had a persistent cough and just develop shortness of breath today. She denies any production with the cough. She states generalized weakness. She she states some lower abdominal discomfort and points to an area just above the left suprapubic region. She denies any melena or hematochezia. She states she's had some urinary incontinence but no dysuria or hematuria. Patient even with a translator is a very poor historian.   Past Medical History  Diagnosis Date  . Cancer ([place])   . Diabetes mellitus without complication ([place])   . Low blood pressure     There are no active problems to display for this patient.   Past Surgical History  Procedure Laterality Date  . Abdominal hysterectomy      Past Surgical History  Procedure Laterality Date  . Abdominal hysterectomy      No current outpatient prescriptions on file.  Allergies:  Review of patient's allergies indicates no known allergies.  Family History: No family history on file.  Social History: Social History  Substance Use Topics  . Smoking status: Never Smoker   . Smokeless tobacco: None  . Alcohol Use: No     Review of Systems:   10 point review of systems was performed and was otherwise  negative:  Constitutional: Patient was not aware of a fever at home Eyes: No visual disturbances ENT: No sore throat, ear pain Cardiac: No chest pain Respiratory: No shortness of breath, wheezing, or stridor Abdomen: No abdominal pain, no vomiting, No diarrhea Endocrine: No weight loss, No night sweats Extremities: No peripheral edema, cyanosis Skin: No rashes, easy bruising Neurologic: No focal weakness, trouble with speech or swollowing Urologic: No dysuria, Hematuria, or urinary frequency No headaches  Physical Exam:  ED Triage Vitals  Enc Vitals Group     BP 02/14/16 1752 101/57 mmHg     Pulse Rate 02/14/16 1752 113     Resp 02/14/16 1810 14     Temp 02/14/16 1752 103.2 F (39.6 C)     Temp Source 02/14/16 1752 Rectal     SpO2 02/14/16 1752 85 %     Weight 02/14/16 1825 120 lb (54.432 kg)     Height 02/14/16 1825 _0  (1.499 m)     Head Cir --      Peak Flow --      Pain Score --      Pain Loc --      Pain Edu? --      Excl. in [place]? --     General: Awake , Alert , and Oriented times 3; GCS 15 Head: Normal cephalic , atraumatic Eyes: Pupils equal , round, reactive to light Nose/Throat: No nasal drainage, patent upper airway without erythema or exudate.  Neck: Supple, Full range of motion, No anterior adenopathy or palpable thyroid masses  Lungs patient has coarse breath sounds auscultated bilaterally at the bases Heart: Tachycardia, regular rhythm without murmurs , gallops , or rubs Abdomen: Tender diffusely and some reproducible pain in the left lower quadrant without rebound, guarding , or rigidity; bowel sounds positive and symmetric in all 4 quadrants. No organomegaly .      No masses  Extremities: 2 plus symmetric pulses. No edema, clubbing or cyanosis Neurologic: normal ambulation, Motor symmetric without deficits, sensory intact Skin: warm, dry, no rashes   Labs:   All laboratory work was reviewed including any pertinent negatives or positives listed below:   Labs Reviewed  URINALYSIS COMPLETEWITH MICROSCOPIC ([facility] ONLY) - Abnormal; Notable for the following:    Color, Urine YELLOW (*)    APPearance HAZY (*)    Glucose, UA >500 (*)    Ketones, ur 2+ (*)    Protein, ur 100 (*)    Bacteria, UA MANY (*)    Squamous Epithelial / LPF 0-5 (*)    All other components within normal limits  CBC WITH DIFFERENTIAL/PLATELET - Abnormal; Notable for the following:    WBC 20.7 (*)    RBC 3.75 (*)    Hemoglobin 10.6 (*)    HCT 32.1 (*)    RDW 15.4 (*)    Neutro Abs 19.0 (*)    Lymphs Abs 0.8 (*)    All other components within normal limits  BASIC METABOLIC PANEL - Abnormal; Notable for the following:    Sodium 132 (*)    Chloride 92 (*)    Glucose, Bld 339 (*)    Calcium 8.5 (*)    All other components within normal limits  RAPID INFLUENZA A&B ANTIGENS ([facility] ONLY)  CULTURE, BLOOD (ROUTINE X 2)  CULTURE, BLOOD (ROUTINE X 2)  LACTIC ACID, PLASMA  LACTIC ACID, PLASMA   review of laboratory work shows an elevated white blood cell count. She also appears to have a urinary tract infection and urine culture is pending. Patient's also had blood cultures 2 pending  Lactic acid is negative    Radiology:   CLINICAL DATA: Assess left renal lesion  EXAM: RENAL / URINARY TRACT ULTRASOUND COMPLETE  COMPARISON: None.  FINDINGS: Right Kidney:  Length: 11.9 cm. Echogenicity within normal limits. No mass or hydronephrosis visualized.  Left Kidney:  Length: 11.8 cm. Hypoechoic lesion is noted in the midportion of left kidney similar to that seen on recent CT examination. It demonstrates slight increased echogenicity with a an and does not fulfill criteria for simple cyst.  Bladder:  Appears normal for degree of bladder distention.  IMPRESSION: Hypoechoic lesion in the left kidney. Given the relative stability in size when compared with 2015 this likely represents a hemorrhagic cyst. Follow-up in 6-12 months can be performed to assess  for stability.  No other focal abnormality is noted.   Electronically Signed By: Inez Catalina M.D. On: 02/14/2016 16:30    I personally reviewed the radiologic studies   ED Course:  Patient presents with 5 fever elevation with an elevated white blood cell count appearance of pneumonia on physical exam and x-ray evaluation. She also appears to have a urinary tract infection. Given that she's been on oral antibiotics I felt the patient needed aggressive IV antibiotics at this time. Her lactic acid level was normal and she was hypoxic and placed on a 2 L nasal cannula. Patient was given IV fluid boluses and has been hypotensive however she apparently does have a history of hypertension.    Assessment:  Community-acquired pneumonia Urinary tract infection   Final Clinical Impression:   Final diagnoses:  Community acquired pneumonia     Plan: * Inpatient management Patient was advised to return immediately if condition worsens. Patient was advised to follow up with their primary care physician or other specialized physicians involved in their outpatient care             Daymon Larsen, MD 02/14/16 2030

## 2016-02-14 NOTE — ED Notes (Signed)
Patient report history of low blood pressure

## 2016-02-14 NOTE — Progress Notes (Signed)
Pharmacy Antibiotic Note  Ruth Walters is a 50 y.o. female admitted on 02/14/2016 with pneumonia.  Pharmacy has been consulted for vancomycin and Zosyn dosing.  Plan: DW 54.4kg  vd 38L kei 0.058 hr-1  t1/2 12 hours Vancomycin 750 mg IV q 12 hours ordered with stacked dosing. Level before 5th dose. Goal 15-20.  Zosyn 4.5 grams ordered for Pseudomonas risk of patient on chemotherapy as outpatient.  Height: 4\' 11"  (149.9 cm) Weight: 120 lb (54.432 kg) IBW/kg (Calculated) : 43.2  Temp (24hrs), Avg:101.4 F (38.6 C), Min:97.5 F (36.4 C), Max:103.3 F (39.6 C)   Recent Labs Lab 02/14/16 1805 02/14/16 1806 02/14/16 2126  WBC 20.7*  --   --   CREATININE 0.50  --   --   LATICACIDVEN  --  1.1 0.7    Estimated Creatinine Clearance: 64.1 mL/min (by C-G formula based on Cr of 0.5).    No Known Allergies  Antimicrobials this admission:   >>    >>   Dose adjustments this admission:   Microbiology results: 2/25 BCx: pending 2/25 UCx: pending  2/25 Sputum: pending   MRSA PCR:   UA: LE(-) NO2(-) WBC 6-30 CXR: L mid and lower lobe opacities  Thank you for allowing pharmacy to be a part of this patient's care.  Raghad Lorenz S 02/14/2016 11:05 PM

## 2016-02-14 NOTE — ED Notes (Signed)
Patient assessed using video interpreter Consuelo ID: [id number]

## 2016-02-14 NOTE — ED Notes (Signed)
Patient brought in by [facility] from home c/o N/V for 15 hours.

## 2016-02-14 NOTE — ED Notes (Signed)
Patient assessed using video interpreter Dalia ID: [id number]

## 2016-02-14 NOTE — ED Notes (Signed)
Patient transported to room 127 

## 2016-02-14 NOTE — H&P (Signed)
[facility] [place] NAME: Ruth Walters    MR#:  [id number]  DATE OF BIRTH:  10/15/1966  DATE OF ADMISSION:  02/14/2016  PRIMARY CARE PHYSICIAN: No primary care provider on file.   REQUESTING/REFERRING PHYSICIAN: Marcelene Butte, MD  CHIEF COMPLAINT:   Chief Complaint  Patient presents with  . Emesis  . Nausea    HISTORY OF PRESENT ILLNESS:  Ruth Walters  is a 50 y.o. female who presents with 3 days increasing malaise and fatigue, with subsequent development of cough and fevers, and then nausea and vomiting for the last 24 hours. She came to the ED today because she had 15 hours straight of severe nausea and vomiting. Patient has multiple myeloma and is currently on chemotherapy treatment. A couple weeks ago she was hospitalized at [facility] with influenza positive. She was treated with Tamiflu and oral antibiotics. She states that she had just completely recovered from that illness for a day or 2 when she started to feel bad this time around. On arrival in the ED she was tachycardic, febrile, tachypneic and has a white blood cell count of 20. She does take dexamethasone 4 mg weekly as part of her chemotherapy regimen. Chest x-ray showed left mid and lower lobe patchy infiltrate. Patient's cough is productive of sputum. Hospitalists were called for admission.  PAST MEDICAL HISTORY:   Past Medical History  Diagnosis Date  . Cancer ([place])   . Diabetes mellitus without complication ([place])   . Low blood pressure     PAST SURGICAL HISTORY:   Past Surgical History  Procedure Laterality Date  . Abdominal hysterectomy      SOCIAL HISTORY:   Social History  Substance Use Topics  . Smoking status: Never Smoker   . Smokeless tobacco: Not on file  . Alcohol Use: No    FAMILY HISTORY:   Family History  Problem Relation Age of Onset  . Leukemia Sister     DRUG ALLERGIES:  No Known Allergies  MEDICATIONS AT HOME:    Prior to Admission medications   Medication Sig Start Date End Date Taking? Authorizing Provider  amoxicillin-clavulanate (AUGMENTIN) 875-125 MG tablet Take 1 tablet by mouth 2 (two) times daily. X 14 days 02/04/16 02/18/16 Yes Historical Provider, MD  aspirin EC 81 MG tablet Take 81 mg by mouth daily. 11/13/14  Yes Historical Provider, MD  dexamethasone (DECADRON) 4 MG tablet Take 20 mg by mouth every Wednesday. 01/07/16  Yes Historical Provider, MD  glipiZIDE (GLUCOTROL) 10 MG tablet Take 10 mg by mouth 2 (two) times daily before a meal. 06/18/15  Yes Historical Provider, MD  metFORMIN (GLUMETZA) 1000 MG (MOD) 24 hr tablet Take 2,000 mg by mouth daily before breakfast. 10/01/15 09/30/16 Yes Historical Provider, MD  oxyCODONE (ROXICODONE) 5 MG immediate release tablet Take 5 mg by mouth every 8 (eight) hours as needed. For pain. 02/04/16 02/03/17 Yes Historical Provider, MD  pomalidomide (POMALYST) 3 MG capsule Take 3 mg by mouth daily. Take for 21 days. Rest for 7 days. Restart as soon as you get. 02/13/16  Yes Historical Provider, MD    REVIEW OF SYSTEMS:  Review of Systems  Constitutional: Positive for fever and malaise/fatigue. Negative for chills and weight loss.  HENT: Negative for ear pain, hearing loss and tinnitus.   Eyes: Negative for blurred vision, double vision, pain and redness.  Respiratory: Positive for cough, sputum production and shortness of breath. Negative for hemoptysis.   Cardiovascular: Negative  for chest pain, palpitations, orthopnea and leg swelling.  Gastrointestinal: Positive for nausea, vomiting and abdominal pain (abdominal wall pain). Negative for diarrhea and constipation.  Genitourinary: Negative for dysuria, frequency and hematuria.  Musculoskeletal: Negative for back pain, joint pain and neck pain.  Skin:       No acne, rash, or lesions  Neurological: Positive for weakness. Negative for dizziness, tremors and focal weakness.  Endo/Heme/Allergies: Negative for  polydipsia. Does not bruise/bleed easily.  Psychiatric/Behavioral: Negative for depression. The patient is not nervous/anxious and does not have insomnia.      VITAL SIGNS:   Filed Vitals:   02/14/16 1900 02/14/16 1915 02/14/16 1945 02/14/16 2030  BP: _0 88/52  Pulse: 101 97 95 91  Temp: 101.9 F (38.8 C) 101.3 F (38.5 C) 100.5 F (38.1 C) 99.8 F (37.7 C)  TempSrc:      Resp: _1 Height:      Weight:      SpO2: 94% 94% 95% 94%   Wt Readings from Last 3 Encounters:  02/14/16 54.432 kg (120 lb)    PHYSICAL EXAMINATION:  Physical Exam  Vitals reviewed. Constitutional: She is oriented to person, place, and time. She appears well-developed and well-nourished. No distress.  HENT:  Head: Normocephalic and atraumatic.  Mouth/Throat: Oropharynx is clear and moist.  Eyes: Conjunctivae and EOM are normal. Pupils are equal, round, and reactive to light. No scleral icterus.  Neck: Normal range of motion. Neck supple. No JVD present. No thyromegaly present.  Cardiovascular: Normal rate, regular rhythm and intact distal pulses.  Exam reveals no gallop and no friction rub.   No murmur heard. Respiratory: Effort normal and breath sounds normal. No respiratory distress. She has no wheezes. She has no rales.  Left mid and lower lobe soft ronchi  GI: Soft. Bowel sounds are normal. She exhibits no distension. There is tenderness (Mild diffuse).  Musculoskeletal: Normal range of motion. She exhibits no edema.  No arthritis, no gout  Lymphadenopathy:    She has no cervical adenopathy.  Neurological: She is alert and oriented to person, place, and time. No cranial nerve deficit.  No dysarthria, no aphasia  Skin: Skin is warm and dry. No rash noted. No erythema.  Psychiatric: She has a normal mood and affect. Her behavior is normal. Judgment and thought content normal.    LABORATORY PANEL:   CBC  Recent Labs Lab 02/14/16 1805  WBC 20.7*  HGB 10.6*  HCT 32.1*   PLT 376   ------------------------------------------------------------------------------------------------------------------  Chemistries   Recent Labs Lab 02/14/16 1805  NA 132*  K 4.1  CL 92*  CO2 25  GLUCOSE 339*  BUN 16  CREATININE 0.50  CALCIUM 8.5*   ------------------------------------------------------------------------------------------------------------------  Cardiac Enzymes No results for input(s): TROPONINI in the last 168 hours. ------------------------------------------------------------------------------------------------------------------  RADIOLOGY:  Dg Chest 2 View  02/14/2016  CLINICAL DATA:  Nausea and vomiting.  Fever. EXAM: CHEST  2 VIEW COMPARISON:  August 03, 2013 FINDINGS: No pneumothorax. The heart size remains borderline. The hila and mediastinum are unremarkable. Mild patchy opacity in the left mid lung in the retrocardiac region, best seen on the frontal view. The findings likely represent patchy pneumonia given history. Suggestion of a small nodule in the right mid lung laterally. This may be calcified. No other acute abnormalities. IMPRESSION: 1. Patchy opacity in the left mid and lower lung. Recommend follow-up to resolution. 2. Possible nodule, perhaps calcified, in the right mid lung. Recommend attention on follow-up.  Electronically Signed   By: Dorise Bullion III M.D   On: 02/14/2016 18:50    EKG:   Orders placed or performed in visit on 08/03/13  . EKG 12-Lead    IMPRESSION AND PLAN:  Principal Problem:   Sepsis ([place]) - secondary to pneumonia. Fluids and broad-spectrum antibiotics started in the ED. Of note, patient does have a history of low blood pressure. Blood cultures sent, urine culture pending and sputum culture ordered. Lactic acid was normal. Continue fluids for support and antibiotics for treatment of her pneumonia. Active Problems:   HCAP (healthcare-associated pneumonia) - left mid and lower lobe pneumonia on chest x-ray.  Antibiotics and cultures as above.   Multiple myeloma ([place]) - patient is currently off cycle for her chemotherapy. We will hold this for now for the same.   Type 2 diabetes mellitus ([facility]) - carb modified diet with sliding scale insulin and glucose checks before meals and at bedtime, check A1c.  All the records are reviewed and case discussed with ED provider. Management plans discussed with the patient and/or family.  DVT PROPHYLAXIS: SubQ lovenox  GI PROPHYLAXIS: None  ADMISSION STATUS: Inpatient  CODE STATUS: Full Code Status History    This patient does not have a recorded code status. Please follow your organizational policy for patients in this situation.      TOTAL TIME TAKING CARE OF THIS PATIENT: 45 minutes.    Munirah Doerner FIELDING 02/14/2016, 8:50 PM  [facility]  Office  [phone]  CC: Primary care physician; No primary care provider on file.

## 2016-02-15 LAB — BLOOD CULTURE ID PANEL (REFLEXED)
ACINETOBACTER BAUMANNII: NOT DETECTED
CARBAPENEM RESISTANCE: NOT DETECTED
Candida albicans: NOT DETECTED
Candida glabrata: NOT DETECTED
Candida krusei: NOT DETECTED
Candida parapsilosis: NOT DETECTED
Candida tropicalis: NOT DETECTED
ENTEROBACTERIACEAE SPECIES: DETECTED — AB
ESCHERICHIA COLI: DETECTED — AB
Enterobacter cloacae complex: NOT DETECTED
Enterococcus species: NOT DETECTED
HAEMOPHILUS INFLUENZAE: NOT DETECTED
Klebsiella oxytoca: NOT DETECTED
Klebsiella pneumoniae: NOT DETECTED
LISTERIA MONOCYTOGENES: NOT DETECTED
METHICILLIN RESISTANCE: NOT DETECTED
NEISSERIA MENINGITIDIS: NOT DETECTED
PSEUDOMONAS AERUGINOSA: NOT DETECTED
Proteus species: NOT DETECTED
SERRATIA MARCESCENS: NOT DETECTED
STAPHYLOCOCCUS AUREUS BCID: NOT DETECTED
STAPHYLOCOCCUS SPECIES: NOT DETECTED
STREPTOCOCCUS PYOGENES: NOT DETECTED
STREPTOCOCCUS SPECIES: NOT DETECTED
Streptococcus agalactiae: NOT DETECTED
Streptococcus pneumoniae: NOT DETECTED
Vancomycin resistance: NOT DETECTED

## 2016-02-15 LAB — CBC
HCT: 26.8 % — ABNORMAL LOW (ref 35.0–47.0)
Hemoglobin: 9.2 g/dL — ABNORMAL LOW (ref 12.0–16.0)
MCH: 28.7 pg (ref 26.0–34.0)
MCHC: 34.2 g/dL (ref 32.0–36.0)
MCV: 84 fL (ref 80.0–100.0)
PLATELETS: 310 10*3/uL (ref 150–440)
RBC: 3.19 MIL/uL — ABNORMAL LOW (ref 3.80–5.20)
RDW: 15.5 % — AB (ref 11.5–14.5)
WBC: 16.6 10*3/uL — AB (ref 3.6–11.0)

## 2016-02-15 LAB — GLUCOSE, CAPILLARY
Glucose-Capillary: 254 mg/dL — ABNORMAL HIGH (ref 65–99)
Glucose-Capillary: 293 mg/dL — ABNORMAL HIGH (ref 65–99)
Glucose-Capillary: 197 mg/dL — ABNORMAL HIGH (ref 65–99)
Glucose-Capillary: 292 mg/dL — ABNORMAL HIGH (ref 65–99)

## 2016-02-15 LAB — BASIC METABOLIC PANEL
Anion gap: 11 (ref 5–15)
BUN: 18 mg/dL (ref 6–20)
CHLORIDE: 100 mmol/L — AB (ref 101–111)
CO2: 25 mmol/L (ref 22–32)
CREATININE: 0.4 mg/dL — AB (ref 0.44–1.00)
Calcium: 7.8 mg/dL — ABNORMAL LOW (ref 8.9–10.3)
GFR calc Af Amer: >60 mL/min (ref >60)
GFR calc non Af Amer: >60 mL/min (ref >60)
GLUCOSE: 297 mg/dL — AB (ref 65–99)
Potassium: 3.6 mmol/L (ref 3.5–5.1)
SODIUM: 136 mmol/L (ref 135–145)

## 2016-02-15 LAB — HEMOGLOBIN A1C: Hgb A1c MFr Bld: 13.9 % — ABNORMAL HIGH (ref 4.0–6.0)

## 2016-02-15 MED ORDER — BENZONATATE 100 MG PO CAPS: 100.0000 mg | ORAL_CAPSULE | Freq: Three times a day (TID) | ORAL | Status: DC | PRN

## 2016-02-15 MED ORDER — PIPERACILLIN-TAZOBACTAM 3.375 G IVPB
3.3750 g | Freq: Three times a day (TID) | INTRAVENOUS | Status: DC
  Administered 2016-02-15: 3.375 g via INTRAVENOUS
  Filled 2016-02-15 (×2): qty 50

## 2016-02-15 MED ORDER — GUAIFENESIN-DM 100-10 MG/5ML PO SYRP: 5.0000 mL | ORAL_SOLUTION | ORAL | Status: DC | PRN

## 2016-02-15 MED ORDER — SODIUM CHLORIDE 0.9 % IV SOLN
1.0000 g | Freq: Three times a day (TID) | INTRAVENOUS | Status: DC
  Administered 2016-02-15 – 2016-02-16 (×3): 1 g via INTRAVENOUS
  Filled 2016-02-15 (×5): qty 1

## 2016-02-15 NOTE — Consult Note (Signed)
Pharmacy Antibiotic Note  Ruth Walters is a 50 y.o. female admitted on 02/14/2016 with bacteremia/UTI.  Pharmacy has been consulted for meropenem dosing.  Plan: BCID results were discussed with Dr. Verdell Carmine and the patient's antibiotics will be changed to meropenem. Further narrowing will be determined based on finalized susceptibilities.  Height: 4\' 11"  (149.9 cm) Weight: 120 lb (54.432 kg) IBW/kg (Calculated) : 43.2  Temp (24hrs), Avg:100.8 F (38.2 C), Min:97.5 F (36.4 C), Max:103.3 F (39.6 C)   Recent Labs Lab 02/14/16 1805 02/14/16 1806 02/14/16 2126 02/15/16 0539  WBC 20.7*  --   --  16.6*  CREATININE 0.50  --   --  0.40*  LATICACIDVEN  --  1.1 0.7  --     Estimated Creatinine Clearance: 64.1 mL/min (by C-G formula based on Cr of 0.4).    No Known Allergies  Antimicrobials this admission: zosyn 2/25 >> 2/26 vanc 2/25 >> 2/26 Meropenem 2/26>>  Dose adjustments this admission: vanc and zosyn d/c meropenem started  Microbiology results: 2/25 BCx: ecoli 2/25 UCx: gram neg rods    Thank you for allowing pharmacy to be a part of this patient's care.  Ramond Dial 02/15/2016 1:53 PM

## 2016-02-15 NOTE — Progress Notes (Signed)
[place] [facility] [place] NAME: Ruth Walters    MR#:  [id number]  DATE OF BIRTH:  1966-04-23  SUBJECTIVE:   She is here due to nausea vomiting and fever and noted to have sepsis. She has a history of underlying multiple myeloma and its treatment at [facility]. Feels a little bit better today. Afebrile, feels nauseated but no vomiting today. Patient seen with the help of a Spanish interpreter  REVIEW OF SYSTEMS:    Review of Systems  Constitutional: Negative for fever and chills.  HENT: Negative for congestion and tinnitus.   Eyes: Negative for blurred vision and double vision.  Respiratory: Negative for cough, shortness of breath and wheezing.   Cardiovascular: Negative for chest pain, orthopnea and PND.  Gastrointestinal: Positive for nausea. Negative for vomiting, abdominal pain and diarrhea.  Genitourinary: Negative for dysuria and hematuria.  Musculoskeletal: Positive for back pain.  Neurological: Positive for weakness (generalized). Negative for dizziness, sensory change and focal weakness.  All other systems reviewed and are negative.   Nutrition: Carb modified.  Tolerating Diet: Yes Tolerating PT: Ambulatory.    DRUG ALLERGIES:  No Known Allergies  VITALS:  Blood pressure 98/51, pulse 98, temperature 98.4 F (36.9 C), temperature source Oral, resp. rate 17, height 4' 11"  (1.499 m), weight 54.432 kg (120 lb), SpO2 98 %.  PHYSICAL EXAMINATION:   Physical Exam  GENERAL:  50 y.o.-year-old patient lying in the bed in no acute distress.  EYES: Pupils equal, round, reactive to light and accommodation. No scleral icterus. Extraocular muscles intact.  HEENT: Head atraumatic, normocephalic. Oropharynx and nasopharynx clear.  NECK:  Supple, no jugular venous distention. No thyroid enlargement, no tenderness.  LUNGS: Normal breath sounds bilaterally, no wheezing, rales, rhonchi. No use of accessory muscles of respiration.   CARDIOVASCULAR: S1, S2 normal. No murmurs, rubs, or gallops.  ABDOMEN: Soft, nontender, nondistended. Bowel sounds present. No organomegaly or mass.  EXTREMITIES: No cyanosis, clubbing or edema b/l.    NEUROLOGIC: Cranial nerves II through XII are intact. No focal Motor or sensory deficits b/l.   PSYCHIATRIC: The patient is alert and oriented x 3.  SKIN: No obvious rash, lesion, or ulcer.    LABORATORY PANEL:   CBC  Recent Labs Lab 02/15/16 0539  WBC 16.6*  HGB 9.2*  HCT 26.8*  PLT 310   ------------------------------------------------------------------------------------------------------------------  Chemistries   Recent Labs Lab 02/15/16 0539  NA 136  K 3.6  CL 100*  CO2 25  GLUCOSE 297*  BUN 18  CREATININE 0.40*  CALCIUM 7.8*   ------------------------------------------------------------------------------------------------------------------  Cardiac Enzymes No results for input(s): TROPONINI in the last 168 hours. ------------------------------------------------------------------------------------------------------------------  RADIOLOGY:  Dg Chest 2 View  02/14/2016  CLINICAL DATA:  Nausea and vomiting.  Fever. EXAM: CHEST  2 VIEW COMPARISON:  August 03, 2013 FINDINGS: No pneumothorax. The heart size remains borderline. The hila and mediastinum are unremarkable. Mild patchy opacity in the left mid lung in the retrocardiac region, best seen on the frontal view. The findings likely represent patchy pneumonia given history. Suggestion of a small nodule in the right mid lung laterally. This may be calcified. No other acute abnormalities. IMPRESSION: 1. Patchy opacity in the left mid and lower lung. Recommend follow-up to resolution. 2. Possible nodule, perhaps calcified, in the right mid lung. Recommend attention on follow-up. 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  #1 sepsis-patient presented to the hospital with fever, tachycardia and chest x-ray findings suggestive of pneumonia. -Source of her sepsis is not likely pneumonia but probably a urinary tract infection. Her urine cultures and blood cultures are positive for gram-negative rod and probably Escherichia coli. -Continue meropenem. Patient was on vancomycin and Zosyn which has been discontinued.  #2 urinary tract infection-continue meropenem. -Follow urine cultures.  #3 leukocytosis-secondary to the sepsis. Follow white cell count with an about therapy.  #4 type 2 diabetes without complication-continue sliding scale insulin.  #5 history of multiple myeloma-for patient follows at the [facility]. She does have diffuse lesions in her thoracic spine and ribs. Continue supportive care with pain control with oxycodone for now.   All the records are reviewed and case discussed with Care Management/Social Workerr. Management plans discussed with the patient, family and they are in agreement.  CODE STATUS: Full  DVT Prophylaxis: Lovenox  TOTAL TIME TAKING CARE OF THIS PATIENT: 30 minutes.   POSSIBLE D/C IN 1-2 DAYS, DEPENDING ON CLINICAL CONDITION.   Henreitta Leber M.D on 02/15/2016 at 2:09 PM  Between 7am to 6pm - Pager - [phone]  After 6pm go to [email or web address] - password [email or web address] [facility]  [facility]  Office  [phone]  CC: Primary care physician; No primary care provider on file.

## 2016-02-15 NOTE — Progress Notes (Signed)
Called by NA that pt's blood pressure was 81/42 and 88/38 on retake.  Had pt sit up and have a drink of water.  Repeat blood pressure was 97/52.  HR 103.  Pt being treated for sepsis and hung next dose of meropenem.  Pt also complains of pain 5/10 and gave tylenol.  Pt talking about her spouse who passed away and how it was hard on her son.  Pt in some emotional distress.  Spanish speaking aid spoke with pt and was able to calm her.  Pt then was able to fall asleep. Dorna Bloom RN

## 2016-02-16 LAB — BASIC METABOLIC PANEL
ANION GAP: 9 (ref 5–15)
BUN: 13 mg/dL (ref 6–20)
CHLORIDE: 101 mmol/L (ref 101–111)
CO2: 27 mmol/L (ref 22–32)
CREATININE: 0.4 mg/dL — AB (ref 0.44–1.00)
Calcium: 7.7 mg/dL — ABNORMAL LOW (ref 8.9–10.3)
GFR calc non Af Amer: >60 mL/min (ref >60)
GLUCOSE: 235 mg/dL — AB (ref 65–99)
Potassium: 3.3 mmol/L — ABNORMAL LOW (ref 3.5–5.1)
Sodium: 137 mmol/L (ref 135–145)

## 2016-02-16 LAB — CBC
HCT: 26.5 % — ABNORMAL LOW (ref 35.0–47.0)
HEMOGLOBIN: 8.9 g/dL — AB (ref 12.0–16.0)
MCH: 28.6 pg (ref 26.0–34.0)
MCHC: 33.4 g/dL (ref 32.0–36.0)
MCV: 85.6 fL (ref 80.0–100.0)
Platelets: 363 10*3/uL (ref 150–440)
RBC: 3.1 MIL/uL — AB (ref 3.80–5.20)
RDW: 15.7 % — ABNORMAL HIGH (ref 11.5–14.5)
WBC: 12.8 10*3/uL — ABNORMAL HIGH (ref 3.6–11.0)

## 2016-02-16 LAB — URINE CULTURE: Culture: >=100000

## 2016-02-16 LAB — GLUCOSE, CAPILLARY
GLUCOSE-CAPILLARY: 270 mg/dL — AB (ref 65–99)
GLUCOSE-CAPILLARY: 315 mg/dL — AB (ref 65–99)
Glucose-Capillary: 246 mg/dL — ABNORMAL HIGH (ref 65–99)
Glucose-Capillary: 281 mg/dL — ABNORMAL HIGH (ref 65–99)

## 2016-02-16 LAB — EXPECTORATED SPUTUM ASSESSMENT W GRAM STAIN, RFLX TO RESP C

## 2016-02-16 LAB — EXPECTORATED SPUTUM ASSESSMENT W REFEX TO RESP CULTURE

## 2016-02-16 MED ORDER — GLUCERNA SHAKE PO LIQD
237.0000 mL | Freq: Two times a day (BID) | ORAL | Status: DC
  Administered 2016-02-16 – 2016-02-17 (×2): 237 mL via ORAL

## 2016-02-16 MED ORDER — DEXTROSE 5 % IV SOLN
2.0000 g | INTRAVENOUS | Status: DC
  Administered 2016-02-16 – 2016-02-17 (×2): 2 g via INTRAVENOUS
  Filled 2016-02-16 (×2): qty 2

## 2016-02-16 MED ORDER — INSULIN DETEMIR 100 UNIT/ML ~~LOC~~ SOLN
14.0000 [IU] | Freq: Every day | SUBCUTANEOUS | Status: DC
  Administered 2016-02-16: 21:00:00 14 [IU] via SUBCUTANEOUS
  Filled 2016-02-16 (×2): qty 0.14

## 2016-02-16 NOTE — Progress Notes (Signed)
Inpatient Diabetes Program Recommendations  AACE/ADA: New Consensus Statement on Inpatient Glycemic Control (2015)  Target Ranges:  Prepandial:   less than 140 mg/dL      Peak postprandial:   less than 180 mg/dL (1-2 hours)      Critically ill patients:  140 - 180 mg/dL   Spoke with patient through interpreter.  Discussed home diabetes management.  She states that she does have a meter and that when she checked it is usually 250-300 mg/dL.  When asked if she has ever taken insulin she states, "yes, I took insulin twice a day when I was pregnant with my son".  Discussed current A1C of 13.9% and goal A1C of 7.0%.  Patient states that she is agreeable to insulin if MD orders for home.  She cannot read Spanish, so therefore all teaching will have to be verbal and demonstration.  I did briefly review signs and symptoms of hypoglycemia and proper treatment.  Patient verbalized understanding.   Note that she see's MD's at [facility] who based on documentation have been adjusting oral medications for diabetes, however patient's blood sugars continue to be greater than goal.    Will ask bedside RN to demonstrate insulin administration and will follow-up on 02/17/16 regarding patient and her needs.   Thanks, Adah Perl, RN, BC-ADM Inpatient Diabetes Coordinator Pager [phone] (8a-5p)

## 2016-02-16 NOTE — Progress Notes (Addendum)
Inpatient Diabetes Program Recommendations  AACE/ADA: New Consensus Statement on Inpatient Glycemic Control (2015)  Target Ranges:  Prepandial:   less than 140 mg/dL      Peak postprandial:   less than 180 mg/dL (1-2 hours)      Critically ill patients:  140 - 180 mg/dL   Review of Glycemic Control:  Results for Ruth Walters, Ruth Walters (MRN [id number]) as of 02/16/2016 10:02  Ref. Range 02/15/2016 07:28 02/15/2016 11:23 02/15/2016 16:19 02/15/2016 21:21 02/16/2016 07:35  Glucose-Capillary Latest Ref Range: 65-99 mg/dL 254 (H) 293 (H) 197 (H) 292 (H) 246 (H)  Results for Ruth Walters, Ruth Walters (MRN [id number]) as of 02/16/2016 10:02  Ref. Range 02/14/2016 18:05  Hemoglobin A1C Latest Ref Range: 4.0-6.0 % 13.9 (H)   Diabetes history: Type 2 diabetes Outpatient Diabetes medications: Glucotrol 10 mg bid, Glumetza 2000 mg with breakfast Current orders for Inpatient glycemic control:  Novolog sensitive tid with meals  Inpatient Diabetes Program Recommendations:    Note that A1C indicates very poor control of blood sugars in past 2-3 months.  Please consider adding Levemir 14 units daily.  It appears that patient needs insulin at home also. Reviewed chart from "Care Everywhere" and note that A1C in October, 2016 was 13.7%.  It appears that oral diabetes medications adjusted however A1C continues to be elevated.  She seems to at least need basal insulin.  Note that will need to avoid hypoglycemia as well.  Thanks, Adah Perl, RN, BC-ADM Inpatient Diabetes Coordinator Pager [phone] (8a-5p)

## 2016-02-16 NOTE — Plan of Care (Signed)
Problem: Physical Regulation: Goal: Diagnostic test results will improve Outcome: Progressing Remains on IV Antibiotics.

## 2016-02-16 NOTE — Progress Notes (Signed)
Initial Nutrition Assessment   INTERVENTION:   Meals and Snacks: Cater to patient preferences. Will send plastic utensils for better tolerance Medical Food Supplement Therapy: will recommend Glucerna Shake po BID, each supplement provides 220 kcal and 10 grams of protein   NUTRITION DIAGNOSIS:   Inadequate oral intake related to acute illness as evidenced by per patient/family report.  GOAL:   Patient will meet greater than or equal to 90% of their needs  MONITOR:    (Energy Intake, glucose Profile, Anthropometrics, Digestive system)  REASON FOR ASSESSMENT:   Malnutrition Screening Tool    ASSESSMENT:   Pt admitted with n/v and weakness secondary to sepsis with health care aquired pna. Pt with h/o flu 2 weeks ago per MD note. Pt with h/o multiple myeloma on chemotherapy followed by [facility]. Visit made with interpretor.  Past Medical History  Diagnosis Date  . Cancer ([place])   . Diabetes mellitus without complication ([place])   . Low blood pressure     Diet Order:  Diet Carb Modified Fluid consistency:: Thin; Room service appropriate?: Yes    Current Nutrition: Recorded po intake 45% since admission, including 20% of breakfast this am. Pt reports eating chicken sandwich and fruit for lunch today and tolerating well.   Food/Nutrition-Related History: Pt reports her appetite has improved since having the flu 2 weeks ago. Pt reports avoiding too much meat because of the cancer in her bones. Pt reports not doing any kind of protein shake/supplement.   Scheduled Medications:  . aspirin EC  81 mg Oral Daily  . cefTRIAXone (ROCEPHIN)  IV  2 g Intravenous Q24H  . enoxaparin (LOVENOX) injection  40 mg Subcutaneous Q24H  . insulin aspart  0-9 Units Subcutaneous TID AC & HS  . insulin detemir  14 Units Subcutaneous QHS  . sodium chloride flush  3 mL Intravenous Q12H     Electrolyte/Renal Profile and Glucose Profile:   Recent Labs Lab 02/14/16 1805 02/15/16 0539 02/16/16 0550   NA 132* 136 137  K 4.1 3.6 3.3*  CL 92* 100* 101  CO2 _0 BUN _1 CREATININE 0.50 0.40* 0.40*  CALCIUM 8.5* 7.8* 7.7*  GLUCOSE 339* 297* 235*   Protein Profile: No results for input(s): ALBUMIN in the last 168 hours.  Gastrointestinal Profile: Last BM:  02/14/2016   Nutrition-Focused Physical Exam Findings: Nutrition-Focused physical exam completed. Findings are no fat depletion, mild-moderate muscle depletion, and no edema.     Weight Change: Pt reports UBW of 140lbs before she was diagnosed with cancer, 2 years ago 17% in 2 years. Pt reports more recently, pt's weight has gone down to 117-118lbs but did not know what exactly she has been weighing recently prior to recent weight loss. No trend in CHL other that likely stated weight on admission of 120lbs.   Height:   Ht Readings from Last 1 Encounters:  02/16/16 _2  (1.499 m)    Weight:   Wt Readings from Last 1 Encounters:  02/16/16 116 lb 14.4 oz (53.025 kg)     BMI:  Body mass index is 23.6 kg/(m^2).  Estimated Nutritional Needs:   Kcal:  BEE: 1060kcals, TEE: (IF 1.1-1.3)(AF 1.2) 1400-1654kcals  Protein:  53-64g protein (1.0-1.2g/kg)  Fluid:  1325-1566m of fluid (25-38mkg)  EDUCATION NEEDS:   No education needs identified at this time   MOFredericksonRD, LDN Pager ([phone]/On-Call Pager ([phone]

## 2016-02-16 NOTE — Progress Notes (Signed)
[place] [facility] [place] NAME: Pennie Vanblarcom    MR#:  [id number]  DATE OF BIRTH:  1966/12/12  SUBJECTIVE:   She is here due to nausea vomiting and fever and noted to have sepsis. She has a history of underlying multiple myeloma and it's being treated at [facility].  Patient seen with the help of a Spanish interpreter.  She feels much better today. Still has a cough. Urine cultures and blood cultures positive for Escherichia coli.  REVIEW OF SYSTEMS:    Review of Systems  Constitutional: Negative for fever and chills.  HENT: Negative for congestion and tinnitus.   Eyes: Negative for blurred vision and double vision.  Respiratory: Negative for cough, shortness of breath and wheezing.   Cardiovascular: Negative for chest pain, orthopnea and PND.  Gastrointestinal: Positive for nausea. Negative for vomiting, abdominal pain and diarrhea.  Genitourinary: Negative for dysuria and hematuria.  Musculoskeletal: Positive for back pain.  Neurological: Positive for weakness (generalized). Negative for dizziness, sensory change and focal weakness.  All other systems reviewed and are negative.   Nutrition: Carb modified.  Tolerating Diet: Yes Tolerating PT: Ambulatory.    DRUG ALLERGIES:  No Known Allergies  VITALS:  Blood pressure 92/61, pulse 97, temperature 98.6 F (37 C), temperature source Oral, resp. rate 20, height 4' 11"  (1.499 m), weight 54.432 kg (120 lb), SpO2 100 %.  PHYSICAL EXAMINATION:   Physical Exam  GENERAL:  50 y.o.-year-old patient lying in the bed in no acute distress.  EYES: Pupils equal, round, reactive to light and accommodation. No scleral icterus. Extraocular muscles intact.  HEENT: Head atraumatic, normocephalic. Oropharynx and nasopharynx clear.  NECK:  Supple, no jugular venous distention. No thyroid enlargement, no tenderness.  LUNGS: Normal breath sounds bilaterally, no wheezing, rales, rhonchi. No use of  accessory muscles of respiration.  CARDIOVASCULAR: S1, S2 normal. No murmurs, rubs, or gallops.  ABDOMEN: Soft, nontender, nondistended. Bowel sounds present. No organomegaly or mass.  EXTREMITIES: No cyanosis, clubbing or edema b/l.    NEUROLOGIC: Cranial nerves II through XII are intact. No focal Motor or sensory deficits b/l.   PSYCHIATRIC: The patient is alert and oriented x 3.  SKIN: No obvious rash, lesion, or ulcer.    LABORATORY PANEL:   CBC  Recent Labs Lab 02/16/16 0550  WBC 12.8*  HGB 8.9*  HCT 26.5*  PLT 363   ------------------------------------------------------------------------------------------------------------------  Chemistries   Recent Labs Lab 02/16/16 0550  NA 137  K 3.3*  CL 101  CO2 27  GLUCOSE 235*  BUN 13  CREATININE 0.40*  CALCIUM 7.7*   ------------------------------------------------------------------------------------------------------------------  Cardiac Enzymes No results for input(s): TROPONINI in the last 168 hours. ------------------------------------------------------------------------------------------------------------------  RADIOLOGY:  Dg Chest 2 View  02/14/2016  CLINICAL DATA:  Nausea and vomiting.  Fever. EXAM: CHEST  2 VIEW COMPARISON:  August 03, 2013 FINDINGS: No pneumothorax. The heart size remains borderline. The hila and mediastinum are unremarkable. Mild patchy opacity in the left mid lung in the retrocardiac region, best seen on the frontal view. The findings likely represent patchy pneumonia given history. Suggestion of a small nodule in the right mid lung laterally. This may be calcified. No other acute abnormalities. IMPRESSION: 1. Patchy opacity in the left mid and lower lung. Recommend follow-up to resolution. 2. Possible nodule, perhaps calcified, in the right mid lung. Recommend attention on follow-up. 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  #1 sepsis-patient presented to the hospital with fever, tachycardia and chest x-ray findings suggestive of pneumonia. -Source of her sepsis is a urinary tract infection. Her urine cultures and blood cultures are positive for E.coli.  -it's not ESBL and I will switch from Meropenem to Ceftriaxone for now.  #2 urinary tract infection- will switch from Meropenem to Ceftriaxone.   #3 leukocytosis-secondary to the sepsis. F - improved w/ IV abx and will monitor.   #4 type 2 diabetes without complication-appreciate Diabetes coordinator input.  - will start on some Levemir, cont. SSI. BS uncontrolled as A1c was 13.9.    #5 history of multiple myeloma-for patient follows at the [facility]. She does have diffuse lesions in her thoracic spine and ribs. Continue supportive care with pain control with oxycodone for now.  Likely d/c home tomorrow.   All the records are reviewed and case discussed with Care Management/Social Workerr. Management plans discussed with the patient, family and they are in agreement.  CODE STATUS: Full  DVT Prophylaxis: Lovenox  TOTAL TIME TAKING CARE OF THIS PATIENT: 30 minutes.   POSSIBLE D/C IN 1-2 DAYS, DEPENDING ON CLINICAL CONDITION.   Henreitta Leber M.D on 02/16/2016 at 1:57 PM  Between 7am to 6pm - Pager - [phone]  After 6pm go to [email or web address] - password [email or web address] [facility]  [facility]  Office  [phone]  CC: Primary care physician; No primary care provider on file.

## 2016-02-17 ENCOUNTER — Inpatient Hospital Stay: Payer: MEDICAID

## 2016-02-17 LAB — CBC
HCT: 27.4 % — ABNORMAL LOW (ref 35.0–47.0)
Hemoglobin: 9.3 g/dL — ABNORMAL LOW (ref 12.0–16.0)
MCH: 28.6 pg (ref 26.0–34.0)
MCHC: 33.9 g/dL (ref 32.0–36.0)
MCV: 84.5 fL (ref 80.0–100.0)
PLATELETS: 430 10*3/uL (ref 150–440)
RBC: 3.25 MIL/uL — AB (ref 3.80–5.20)
RDW: 15.6 % — AB (ref 11.5–14.5)
WBC: 9.4 10*3/uL (ref 3.6–11.0)

## 2016-02-17 LAB — GLUCOSE, CAPILLARY
GLUCOSE-CAPILLARY: 132 mg/dL — AB (ref 65–99)
Glucose-Capillary: 301 mg/dL — ABNORMAL HIGH (ref 65–99)

## 2016-02-17 IMAGING — US US RENAL
1 series · 14 of 25 positions shown · non-contrast
Comparison: None in PACs

CLINICAL DATA: Sepsis, healthcare associated pneumonia, multiple
myeloma, diabetes.

EXAM:
RENAL / URINARY TRACT ULTRASOUND COMPLETE

[Series 1: us renal · 14 of 53 slices shown]
[im 1/53]
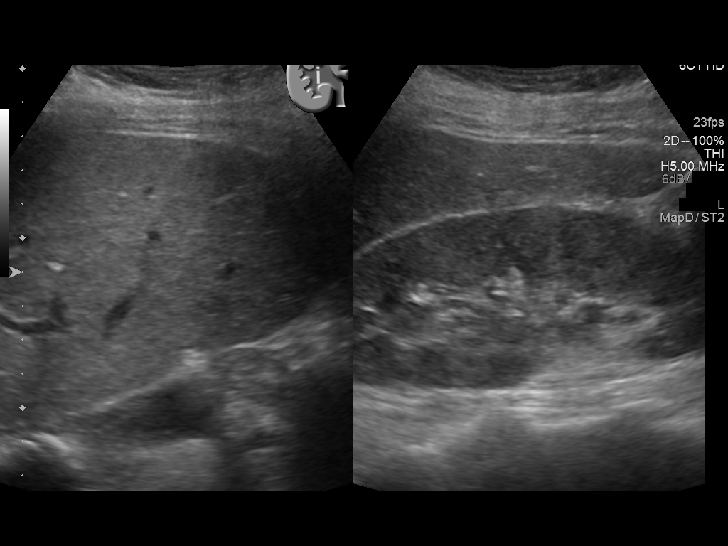
[im 5/53]
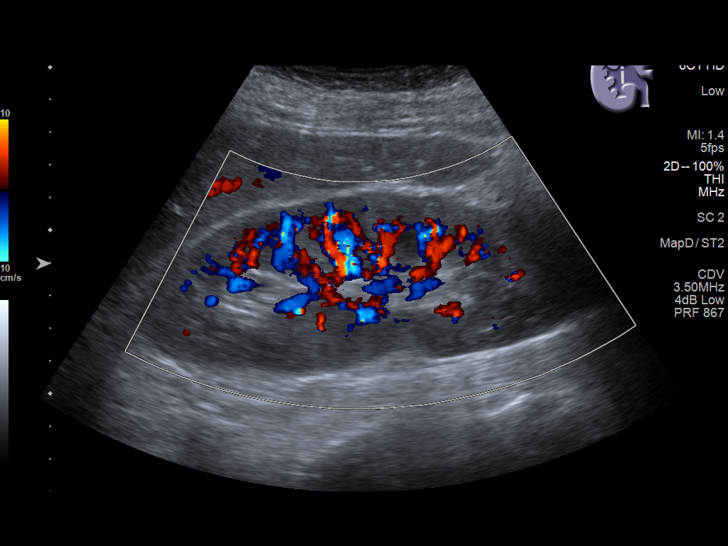
[im 9/53]
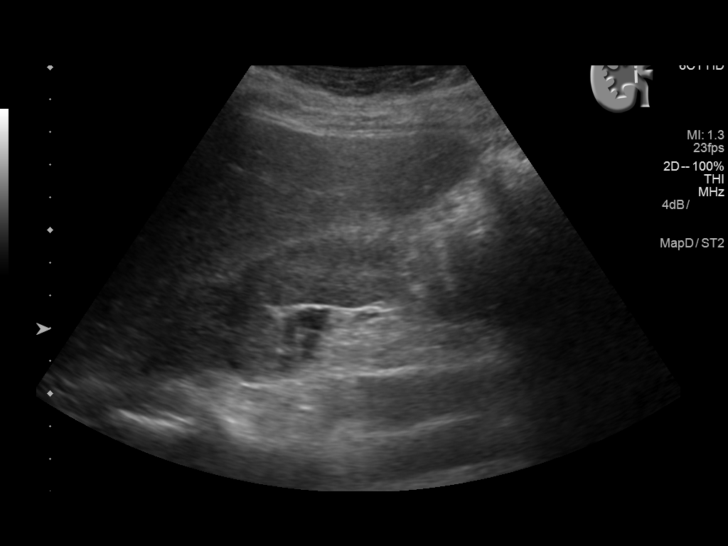
[im 14/53]
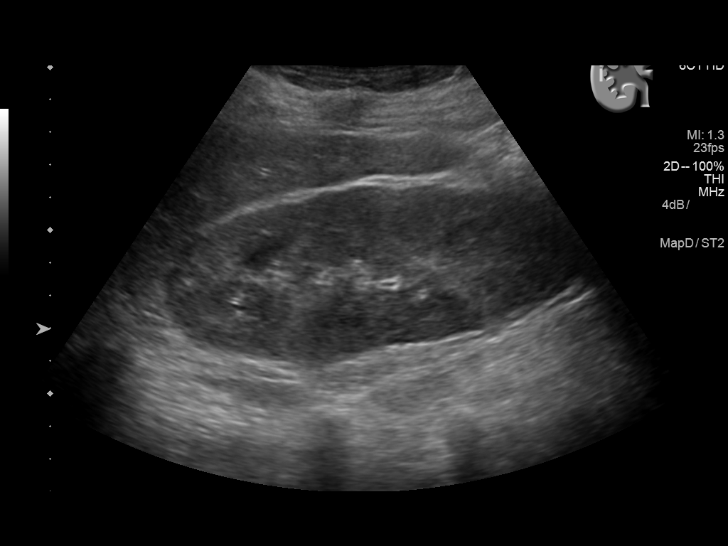
[im 18/53]
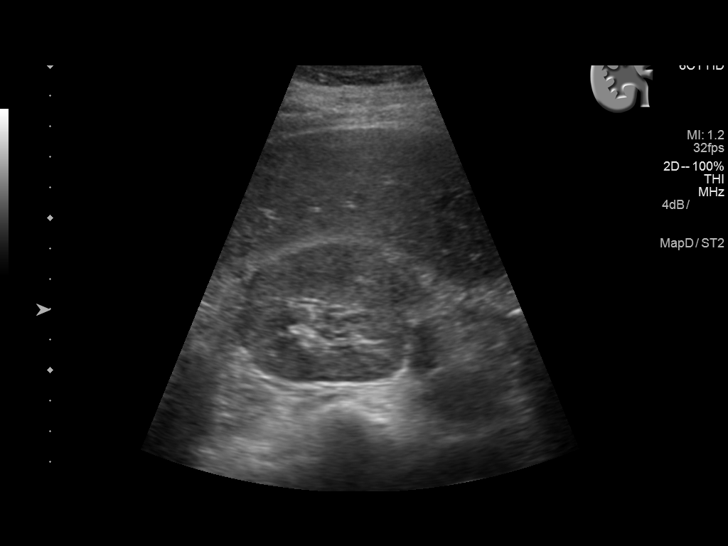
[im 20/53]
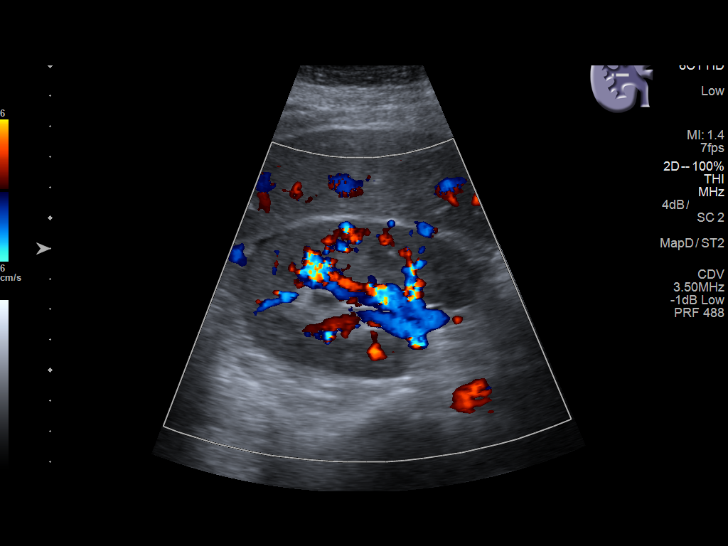
[im 24/53]
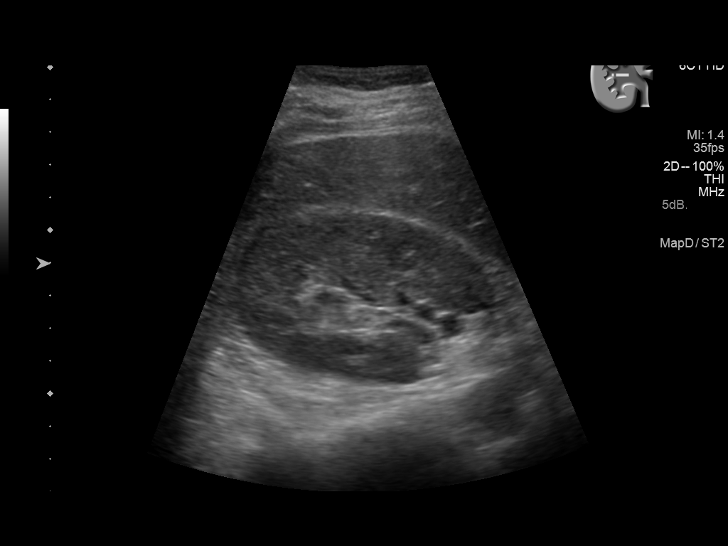
[im 29/53]
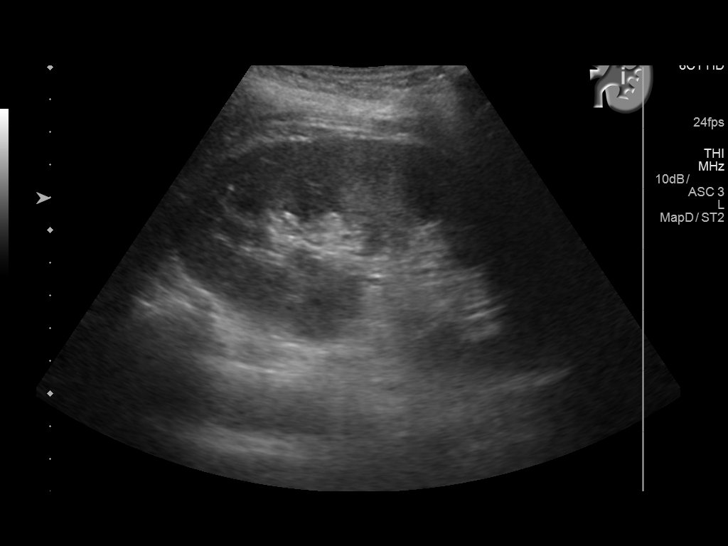
[im 33/53]
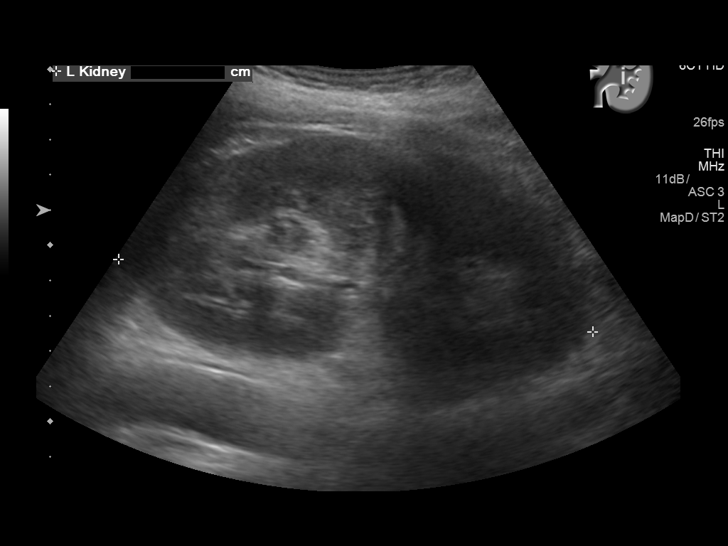
[im 35/53]
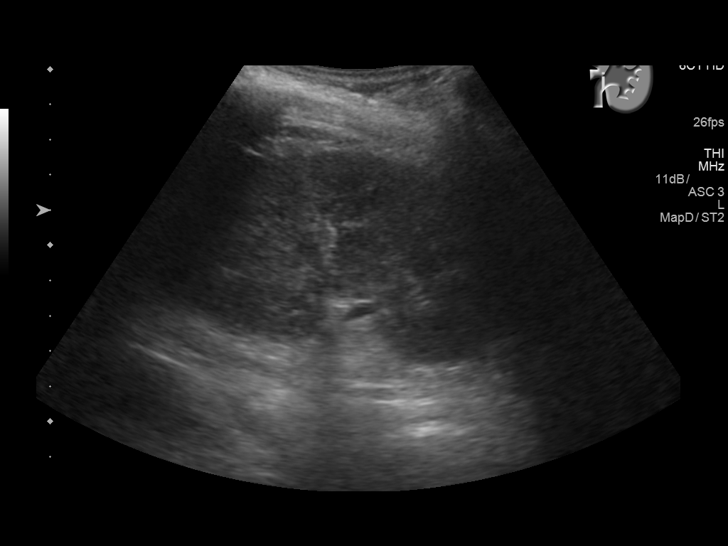
[im 40/53]
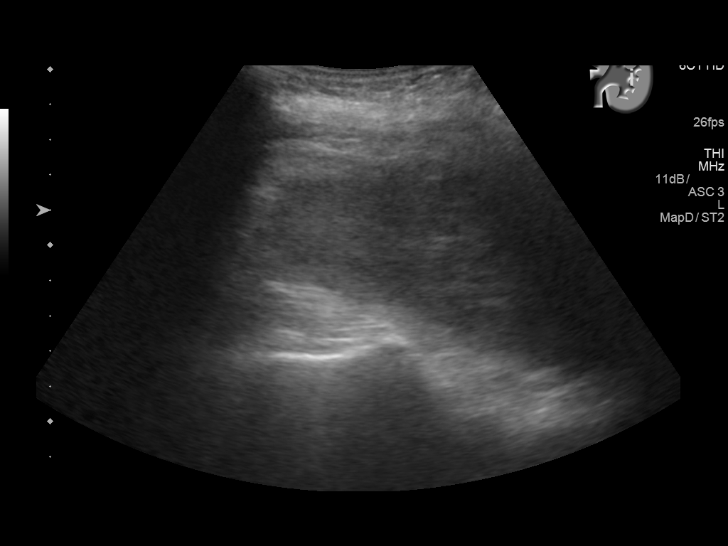
[im 44/53]
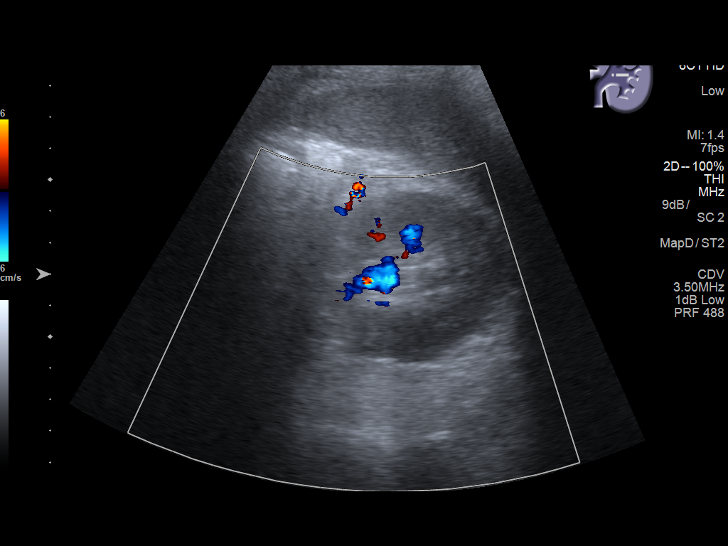
[im 48/53]
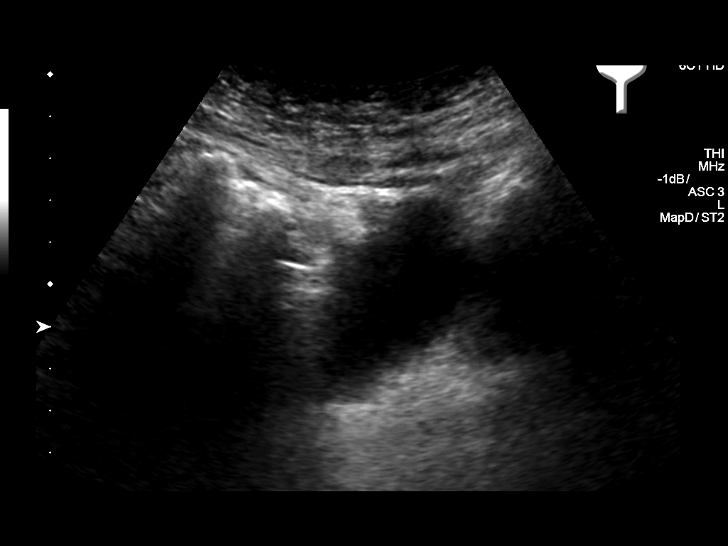
[im 53/53]
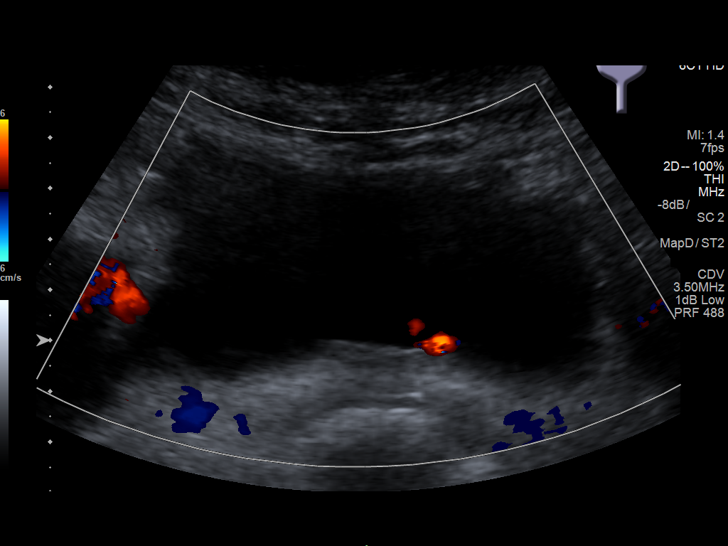

[14 of 25 positions shown; findings below may reference images not displayed]

FINDINGS: Right Kidney:

Length: 13.6 cm. The renal cortical echotexture remains lower than
that of the adjacent liver. There is no hydronephrosis. A trace of
perinephric fluid laterally is suspected.

Left Kidney:

Length: 14.2 cm. The renal cortical echotexture appears normal.
There is no hydronephrosis.

Bladder:

Appears normal for degree of bladder distention. Bilateral ureteral
jets are observed.
IMPRESSION: There is no evidence of hydronephrosis nor other acute renal
abnormality. A trace of fluid adjacent to the right kidney is
suspected.

## 2016-02-17 MED ORDER — SODIUM CHLORIDE 0.9 % IV BOLUS (SEPSIS)
500.0000 mL | Freq: Once | INTRAVENOUS | Status: AC
  Administered 2016-02-17: 500 mL via INTRAVENOUS

## 2016-02-17 MED ORDER — CIPROFLOXACIN HCL 250 MG PO TABS: 250.0000 mg | ORAL_TABLET | Freq: Two times a day (BID) | ORAL | Status: DC

## 2016-02-17 MED ORDER — INSULIN NPH (HUMAN) (ISOPHANE) 100 UNIT/ML ~~LOC~~ SUSP: 8.0000 [IU] | Freq: Two times a day (BID) | SUBCUTANEOUS | Status: DC

## 2016-02-17 MED ORDER — SODIUM CHLORIDE 0.9 % IV BOLUS (SEPSIS)
500.0000 mL | Freq: Once | INTRAVENOUS | Status: AC
  Administered 2016-02-17: 06:00:00 500 mL via INTRAVENOUS

## 2016-02-17 NOTE — Care Management (Signed)
Met with patient and family along with Spanish interpreter. I have requested assistance through [employer] [facility] for insulin and Cipro. Patient has application to [employer] and has appointment with oncologist at [facility] Monday 03/01/16. Patient advised to take records from this hospital stay to that appointment for the MD to follow new medications- she agrees. She is undocumented. She has a glucometer. No further RNCM needs.  

## 2016-02-17 NOTE — Progress Notes (Signed)
Inpatient Diabetes Program Recommendations  AACE/ADA: New Consensus Statement on Inpatient Glycemic Control (2015)  Target Ranges:  Prepandial:   less than 140 mg/dL      Peak postprandial:   less than 180 mg/dL (1-2 hours)      Critically ill patients:  140 - 180 mg/dL   Review of Glycemic Control: Results for VARONICA, BRADBURN (MRN [id number]) as of 02/17/2016 09:22  Ref. Range 02/17/2016 07:30  Glucose-Capillary Latest Ref Range: 65-99 mg/dL 132 (H)   Results for KEYIANA, NAPORA (MRN [id number]) as of 02/17/2016 09:22  Ref. Range 02/14/2016 18:05  Hemoglobin A1C Latest Ref Range: 4.0-6.0 % 13.9 (H)    Diabetes history: Type 2 diabetes Outpatient Diabetes medications: Glucotrol 10 mg bid, Glumetza 2000 mg daily with breakfast Current orders for Inpatient glycemic control:  Levemir 14 units q HS, Novolog sensitive tid with meals and HS Inpatient Diabetes Program Recommendations:    Blood sugars much better this morning.  It appears that patient would benefit from the addition of insulin.  Discussed with RN.  Will order case management consult to assess patient's resources for medications.  If she is unable to get Levemir, then may need more affordable insulin option such as NPH?  Thanks, Adah Perl, RN, BC-ADM Inpatient Diabetes Coordinator Pager [phone] (8a-5p)

## 2016-02-17 NOTE — Progress Notes (Signed)
Pt hypotensive this AM at 87/49 and 87/52. Called Dr Marcille Blanco and received order for fluid bolus of .9 NS 500 ml.  Dorna Bloom RN

## 2016-02-17 NOTE — Discharge Summary (Signed)
[facility] [place] [place] NAME: Ruth Walters    MR#:  [id number]  DATE OF BIRTH:  1966/01/03  DATE OF ADMISSION:  02/14/2016 ADMITTING PHYSICIAN: Lance Coon, MD  DATE OF DISCHARGE: 02/17/2016  4:03 PM  PRIMARY CARE PHYSICIAN: No primary care provider on file.    ADMISSION DIAGNOSIS:  Community acquired pneumonia [J18.9]  DISCHARGE DIAGNOSIS:  Principal Problem:   Sepsis ([place]) Active Problems:   HCAP (healthcare-associated pneumonia)   Multiple myeloma ([place])   Type 2 diabetes mellitus ([place])   SECONDARY DIAGNOSIS:   Past Medical History  Diagnosis Date  . Cancer ([place])   . Diabetes mellitus without complication ([place])   . 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  #1 sepsis-patient presented to the hospital with fever, tachycardia and chest x-ray findings suggestive of pneumonia. She the source of her sepsis was thought to be pneumonia. Pneumonia has now been ruled out and the source of her sepsis is likely a urinary tract infection. -Her urine and blood cultures were positive for Escherichia coli. She was initially placed on broad-spectrum antibiotics with vancomycin, Zosyn. She was then narrowed down to meropenem and then to ceftriaxone and is not being discharged on oral ciprofloxacin to finish a total of 2 week course of treatment. -She has been clinically afebrile and hemodynamically stable over the past 48 hours.  #2 urinary tract infection-this was associated patient's sepsis. She has she had no urinary symptoms to. -I'll obtain a renal ultrasound which was negative for any acute pathology. She is being discharged on oral ciprofloxacin as mentioned above.  #3 leukocytosis-secondary to the sepsis. This has improved and now normalized with IV  abx.  #4 type 2 diabetes without complication-patient's blood sugars were somewhat labile. Her hemoglobin A1c was noted to be as high as 13.9. -Diabetes coordinator also evaluated the patient. Patient was started on some Levemir but is being discharged on some NPH twice daily and will continue her glipizide and metformin. Further titrations to diabetic meds can be done as an outpatient.  #5 history of multiple myeloma- patient follows at the [facility]. She does have diffuse lesions in her thoracic spine and ribs.  - she will cont. Her Oxycodone for pain.    DISCHARGE CONDITIONS:   Stable  CONSULTS OBTAINED:     DRUG ALLERGIES:  No Known Allergies  DISCHARGE MEDICATIONS:   Discharge Medication List as of 02/17/2016 12:22 PM    START taking these medications   Details  ciprofloxacin (CIPRO) 250 MG tablet Take 1 tablet (250 mg total) by mouth 2 (two) times daily., Starting 02/17/2016, Until Discontinued, Print    insulin NPH Human (HUMULIN N) 100 UNIT/ML injection Inject 0.08 mLs (8 Units total) into the skin 2 (two) times daily before a meal., Starting 02/17/2016, Until Discontinued, Print      CONTINUE these medications which have NOT CHANGED   Details  aspirin EC 81 MG tablet Take 81 mg by mouth daily., Starting 11/13/2014, Until Discontinued, Historical Med    dexamethasone (DECADRON) 4 MG tablet Take 20 mg by mouth every Wednesday., Starting 01/07/2016, Until Discontinued, Historical Med    glipiZIDE (GLUCOTROL) 10 MG tablet Take 10 mg by mouth 2 (two) times daily before a meal., Starting 06/18/2015, Until Discontinued, Historical Med    metFORMIN (  GLUMETZA) 1000 MG (MOD) 24 hr tablet Take 2,000 mg by mouth daily before breakfast., Starting 10/01/2015, Until Thu 09/30/16, Historical Med    oxyCODONE (ROXICODONE) 5 MG immediate release tablet Take 5 mg by mouth every 8 (eight) hours as needed. For pain., Starting 02/04/2016, Until Thu 02/03/17, Historical Med     pomalidomide (POMALYST) 3 MG capsule Take 3 mg by mouth daily. Take for 21 days. Rest for 7 days. Restart as soon as you get., Starting 02/13/2016, Until Discontinued, Historical Med      STOP taking these medications     amoxicillin-clavulanate (AUGMENTIN) 875-125 MG tablet          DISCHARGE INSTRUCTIONS:   DIET:  Cardiac diet and Diabetic diet  DISCHARGE CONDITION:  Stable  ACTIVITY:  Activity as tolerated  OXYGEN:  Home Oxygen: No.   Oxygen Delivery: room air  DISCHARGE LOCATION:  home   If you experience worsening of your admission symptoms, develop shortness of breath, life threatening emergency, suicidal or homicidal thoughts you must seek medical attention immediately by calling 911 or calling your MD immediately  if symptoms less severe.  You Must read complete instructions/literature along with all the possible adverse reactions/side effects for all the Medicines you take and that have been prescribed to you. Take any new Medicines after you have completely understood and accpet all the possible adverse reactions/side effects.   Please note  You were cared for by a hospitalist during your hospital stay. If you have any questions about your discharge medications or the care you received while you were in the hospital after you are discharged, you can call the unit and asked to speak with the hospitalist on call if the hospitalist that took care of you is not available. Once you are discharged, your primary care physician will handle any further medical issues. Please note that NO REFILLS for any discharge medications will be authorized once you are discharged, as it is imperative that you return to your primary care physician (or establish a relationship with a primary care physician if you do not have one) for your aftercare needs so that they can reassess your need for medications and monitor your lab values.     Today   Afebrile, hemodynamically stable. No  chest pain, shortness of breath.  VITAL SIGNS:  Blood pressure 96/56, pulse 94, temperature 98.5 F (36.9 C), temperature source Oral, resp. rate 20, height 4' 11"  (1.499 m), weight 53.025 kg (116 lb 14.4 oz), SpO2 99 %.  I/O:   Intake/Output Summary (Last 24 hours) at 02/17/16 1622 Last data filed at 02/17/16 1300  Gross per 24 hour  Intake   1220 ml  Output    650 ml  Net    570 ml    PHYSICAL EXAMINATION:   GENERAL: 50 y.o.-year-old patient lying in the bed in no acute distress.  EYES: Pupils equal, round, reactive to light and accommodation. No scleral icterus. Extraocular muscles intact.  HEENT: Head atraumatic, normocephalic. Oropharynx and nasopharynx clear.  NECK: Supple, no jugular venous distention. No thyroid enlargement, no tenderness.  LUNGS: Normal breath sounds bilaterally, no wheezing, rales, rhonchi. No use of accessory muscles of respiration.  CARDIOVASCULAR: S1, S2 normal. No murmurs, rubs, or gallops.  ABDOMEN: Soft, nontender, nondistended. Bowel sounds present. No organomegaly or mass.  EXTREMITIES: No cyanosis, clubbing or edema b/l.  NEUROLOGIC: Cranial nerves II through XII are intact. No focal Motor or sensory deficits b/l.  PSYCHIATRIC: The patient is alert and oriented x  3.  SKIN: No obvious rash, lesion, or ulcer.   DATA REVIEW:   CBC  Recent Labs Lab 02/17/16 0445  WBC 9.4  HGB 9.3*  HCT 27.4*  PLT 430    Chemistries   Recent Labs Lab 02/16/16 0550  NA 137  K 3.3*  CL 101  CO2 27  GLUCOSE 235*  BUN 13  CREATININE 0.40*  CALCIUM 7.7*    Cardiac Enzymes No results for input(s): TROPONINI in the last 168 hours.  Microbiology Results  Results for orders placed or performed during the hospital encounter of 02/14/16  Rapid Influenza A&B Antigens ([place] only)     Status: None   Collection Time: 02/14/16  6:06 PM  Result Value Ref Range Status   Influenza A [facility]) NOT DETECTED  Final   Influenza B ([place]) NOT  DETECTED  Final  Culture, blood (Routine X 2) w Reflex to ID Panel     Status: None (Preliminary result)   Collection Time: 02/14/16  6:06 PM  Result Value Ref Range Status   Specimen Description BLOOD BLOOD RIGHT ARM  Final   Special Requests BOTTLES DRAWN AEROBIC AND ANAEROBIC 12CC  Final   Culture  Setup Time   Final    GRAM NEGATIVE RODS AEROBIC BOTTLE ONLY CRITICAL RESULT CALLED TO, READ BACK BY AND VERIFIED WITH: MARTHA [place] 02/15/2016 1339 BY JRS.    Culture ESCHERICHIA COLI AEROBIC BOTTLE ONLY   Final   Report Status PENDING  Incomplete   Organism ID, Bacteria ESCHERICHIA COLI  Final      Susceptibility   Escherichia coli - MIC*    AMPICILLIN >=32 RESISTANT Resistant     CEFAZOLIN 16 INTERMEDIATE Intermediate     CEFEPIME <=1 SENSITIVE Sensitive     CEFTAZIDIME <=1 SENSITIVE Sensitive     CEFTRIAXONE <=1 SENSITIVE Sensitive     CIPROFLOXACIN <=0.25 SENSITIVE Sensitive     GENTAMICIN <=1 SENSITIVE Sensitive     IMIPENEM <=0.25 SENSITIVE Sensitive     TRIMETH/SULFA <=20 SENSITIVE Sensitive     AMPICILLIN/SULBACTAM >=32 RESISTANT Resistant     PIP/TAZO <=4 SENSITIVE Sensitive     Extended ESBL NEGATIVE Sensitive     * ESCHERICHIA COLI  Blood Culture ID Panel (Reflexed)     Status: Abnormal   Collection Time: 02/14/16  6:06 PM  Result Value Ref Range Status   Enterococcus species NOT DETECTED NOT DETECTED Final   Vancomycin resistance NOT DETECTED NOT DETECTED Final   Listeria monocytogenes NOT DETECTED NOT DETECTED Final   Staphylococcus species NOT DETECTED NOT DETECTED Final   Staphylococcus aureus NOT DETECTED NOT DETECTED Final   Methicillin resistance NOT DETECTED NOT DETECTED Final   Streptococcus species NOT DETECTED NOT DETECTED Final   Streptococcus agalactiae NOT DETECTED NOT DETECTED Final   Streptococcus pneumoniae NOT DETECTED NOT DETECTED Final   Streptococcus pyogenes NOT DETECTED NOT DETECTED Final   Acinetobacter baumannii NOT DETECTED NOT DETECTED  Final   Enterobacteriaceae species DETECTED (A) NOT DETECTED Final    Comment: CRITICAL RESULT CALLED TO, READ BACK BY AND VERIFIED WITH: MARTHA [place] 2/26/2017B 1339 BY JRS.    Enterobacter cloacae complex NOT DETECTED NOT DETECTED Final   Escherichia coli DETECTED (A) NOT DETECTED Final    Comment: CRITICAL RESULT CALLED TO, READ BACK BY AND VERIFIED WITH: MARTHA MACCIA 02/15/2016 1339 BY JRS.    Klebsiella oxytoca NOT DETECTED NOT DETECTED Final   Klebsiella pneumoniae NOT DETECTED NOT DETECTED Final   Proteus species NOT DETECTED NOT DETECTED Final  Serratia marcescens NOT DETECTED NOT DETECTED Final   Carbapenem resistance NOT DETECTED NOT DETECTED Final   Haemophilus influenzae NOT DETECTED NOT DETECTED Final   Neisseria meningitidis NOT DETECTED NOT DETECTED Final   Pseudomonas aeruginosa NOT DETECTED NOT DETECTED Final   Candida albicans NOT DETECTED NOT DETECTED Final   Candida glabrata NOT DETECTED NOT DETECTED Final   Candida krusei NOT DETECTED NOT DETECTED Final   Candida parapsilosis NOT DETECTED NOT DETECTED Final   Candida tropicalis NOT DETECTED NOT DETECTED Final  Culture, blood (Routine X 2) w Reflex to ID Panel     Status: None (Preliminary result)   Collection Time: 02/14/16  6:14 PM  Result Value Ref Range Status   Specimen Description BLOOD BLOOD RIGHT FOREARM  Final   Special Requests BOTTLES DRAWN AEROBIC AND ANAEROBIC 10CC  Final   Culture NO GROWTH 3 DAYS  Final   Report Status PENDING  Incomplete  Urine culture     Status: None   Collection Time: 02/14/16  6:14 PM  Result Value Ref Range Status   Specimen Description URINE, CLEAN CATCH  Final   Special Requests NONE  Final   Culture >=100,000 COLONIES/mL ESCHERICHIA COLI  Final   Report Status 02/16/2016 FINAL  Final   Organism ID, Bacteria ESCHERICHIA COLI  Final      Susceptibility   Escherichia coli - MIC*    AMPICILLIN >=32 RESISTANT Resistant     CEFAZOLIN <=4 SENSITIVE Sensitive      CEFTRIAXONE <=1 SENSITIVE Sensitive     CIPROFLOXACIN <=0.25 SENSITIVE Sensitive     GENTAMICIN <=1 SENSITIVE Sensitive     IMIPENEM <=0.25 SENSITIVE Sensitive     NITROFURANTOIN <=16 SENSITIVE Sensitive     TRIMETH/SULFA <=20 SENSITIVE Sensitive     AMPICILLIN/SULBACTAM >=32 RESISTANT Resistant     PIP/TAZO <=4 SENSITIVE Sensitive     Extended ESBL NEGATIVE Sensitive     * >=100,000 COLONIES/mL ESCHERICHIA COLI  Culture, sputum-assessment     Status: None   Collection Time: 02/16/16 11:13 AM  Result Value Ref Range Status   Specimen Description EXPECTORATED SPUTUM  Final   Special Requests NONE  Final   Sputum evaluation THIS SPECIMEN IS ACCEPTABLE FOR SPUTUM CULTURE  Final   Report Status 02/16/2016 FINAL  Final  Culture, respiratory (NON-Expectorated)     Status: None (Preliminary result)   Collection Time: 02/16/16 11:13 AM  Result Value Ref Range Status   Specimen Description EXPECTORATED SPUTUM  Final   Special Requests NONE Reflexed from [id number]  Final   Gram Stain PENDING  Incomplete   Culture Consistent with normal respiratory flora.  Final   Report Status PENDING  Incomplete    RADIOLOGY:  US Renal  02/17/2016  CLINICAL DATA:  Sepsis, healthcare associated pneumonia, multiple myeloma, diabetes. EXAM: RENAL / URINARY TRACT ULTRASOUND COMPLETE COMPARISON:  None in PACs FINDINGS: Right Kidney: Length: 13.6 cm. The renal cortical echotexture remains lower than that of the adjacent liver. There is no hydronephrosis. A trace of perinephric fluid laterally is suspected. Left Kidney: Length: 14.2 cm. The renal cortical echotexture appears normal. There is no hydronephrosis. Bladder: Appears normal for degree of bladder distention. Bilateral ureteral jets are observed. IMPRESSION: There is no evidence of hydronephrosis nor other acute renal abnormality. A trace of fluid adjacent to the right kidney is suspected. Electronically Signed   By: David  [place] M.D.   On: 02/17/2016 09:12       Management plans discussed with the patient, family and they  are in agreement.  CODE STATUS:     Code Status Orders        Start     Ordered   02/14/16 2247  Full code   Continuous     02/14/16 2246    Code Status History    Date Active Date Inactive Code Status Order ID Comments User Context   This patient has a current code status but no historical code status.      TOTAL TIME TAKING CARE OF THIS PATIENT: 40 minutes.    Henreitta Leber M.D on 02/17/2016 at 4:22 PM  Between 7am to 6pm - Pager - [phone]  After 6pm go to [email or web address] - password [email or web address] [facility]  [facility]  Office  [phone]  CC: Primary care physician; No primary care provider on file.

## 2016-02-18 LAB — CULTURE, RESPIRATORY

## 2016-02-18 LAB — CULTURE, RESPIRATORY W GRAM STAIN: Culture: NORMAL

## 2016-02-19 LAB — CULTURE, BLOOD (ROUTINE X 2)

## 2016-02-23 LAB — CULTURE, BLOOD (ROUTINE X 2): Culture: NO GROWTH

## 2016-02-26 ENCOUNTER — Ambulatory Visit: Payer: Self-pay

## 2016-10-16 ENCOUNTER — Inpatient Hospital Stay
Admission: EM | Admit: 2016-10-16 | Discharge: 2016-10-19 | DRG: 872 | Disposition: A | Discharge status: Home / Self Care | Payer: Self-pay | Attending: Internal Medicine | Admitting: Internal Medicine

## 2016-10-16 ENCOUNTER — Emergency Department: Payer: Self-pay

## 2016-10-16 ENCOUNTER — Encounter: Payer: Self-pay | Admitting: Emergency Medicine

## 2016-10-16 DIAGNOSIS — Z806 Family history of leukemia: Secondary | ICD-10-CM

## 2016-10-16 DIAGNOSIS — N390 Urinary tract infection, site not specified: Secondary | ICD-10-CM

## 2016-10-16 DIAGNOSIS — A4151 Sepsis due to Escherichia coli [E. coli]: Principal | ICD-10-CM | POA: Diagnosis present

## 2016-10-16 DIAGNOSIS — E871 Hypo-osmolality and hyponatremia: Secondary | ICD-10-CM | POA: Diagnosis present

## 2016-10-16 DIAGNOSIS — Z794 Long term (current) use of insulin: Secondary | ICD-10-CM

## 2016-10-16 DIAGNOSIS — A419 Sepsis, unspecified organism: Secondary | ICD-10-CM | POA: Diagnosis present

## 2016-10-16 DIAGNOSIS — C9000 Multiple myeloma not having achieved remission: Secondary | ICD-10-CM | POA: Diagnosis present

## 2016-10-16 DIAGNOSIS — Z79891 Long term (current) use of opiate analgesic: Secondary | ICD-10-CM

## 2016-10-16 DIAGNOSIS — E1165 Type 2 diabetes mellitus with hyperglycemia: Secondary | ICD-10-CM | POA: Diagnosis present

## 2016-10-16 DIAGNOSIS — N3001 Acute cystitis with hematuria: Secondary | ICD-10-CM | POA: Diagnosis present

## 2016-10-16 DIAGNOSIS — E860 Dehydration: Secondary | ICD-10-CM | POA: Diagnosis present

## 2016-10-16 DIAGNOSIS — Z7982 Long term (current) use of aspirin: Secondary | ICD-10-CM

## 2016-10-16 LAB — URINALYSIS COMPLETE WITH MICROSCOPIC (ARMC ONLY)
BILIRUBIN URINE: NEGATIVE
Glucose, UA: >500 mg/dL — AB
NITRITE: POSITIVE — AB
Protein, ur: 100 mg/dL — AB
SPECIFIC GRAVITY, URINE: 1.024 (ref 1.005–1.030)
pH: 6 (ref 5.0–8.0)

## 2016-10-16 LAB — CBC WITH DIFFERENTIAL/PLATELET
BASOS ABS: 0 10*3/uL (ref 0–0.1)
BASOS PCT: 0 %
EOS ABS: 0 10*3/uL (ref 0–0.7)
Eosinophils Relative: 0 %
HEMATOCRIT: 37.9 % (ref 35.0–47.0)
HEMOGLOBIN: 12.6 g/dL (ref 12.0–16.0)
Lymphocytes Relative: 5 %
Lymphs Abs: 0.7 10*3/uL — ABNORMAL LOW (ref 1.0–3.6)
MCH: 28.3 pg (ref 26.0–34.0)
MCHC: 33.3 g/dL (ref 32.0–36.0)
MCV: 85 fL (ref 80.0–100.0)
MONOS PCT: 5 %
Monocytes Absolute: 0.8 10*3/uL (ref 0.2–0.9)
NEUTROS ABS: 12.8 10*3/uL — AB (ref 1.4–6.5)
NEUTROS PCT: 90 %
Platelets: 274 10*3/uL (ref 150–440)
RBC: 4.45 MIL/uL (ref 3.80–5.20)
RDW: 13.9 % (ref 11.5–14.5)
WBC: 14.2 10*3/uL — AB (ref 3.6–11.0)

## 2016-10-16 LAB — COMPREHENSIVE METABOLIC PANEL
ALBUMIN: 3.4 g/dL — AB (ref 3.5–5.0)
ALK PHOS: 167 U/L — AB (ref 38–126)
ALT: 11 U/L — ABNORMAL LOW (ref 14–54)
ANION GAP: 10 (ref 5–15)
AST: 13 U/L — ABNORMAL LOW (ref 15–41)
BILIRUBIN TOTAL: 0.6 mg/dL (ref 0.3–1.2)
BUN: 19 mg/dL (ref 6–20)
CALCIUM: 8.7 mg/dL — AB (ref 8.9–10.3)
CO2: 25 mmol/L (ref 22–32)
Chloride: 98 mmol/L — ABNORMAL LOW (ref 101–111)
Creatinine, Ser: 0.6 mg/dL (ref 0.44–1.00)
Glucose, Bld: 472 mg/dL — ABNORMAL HIGH (ref 65–99)
POTASSIUM: 4.1 mmol/L (ref 3.5–5.1)
Sodium: 133 mmol/L — ABNORMAL LOW (ref 135–145)
TOTAL PROTEIN: 7 g/dL (ref 6.5–8.1)

## 2016-10-16 LAB — GLUCOSE, CAPILLARY
GLUCOSE-CAPILLARY: 452 mg/dL — AB (ref 65–99)
GLUCOSE-CAPILLARY: 422 mg/dL — AB (ref 65–99)

## 2016-10-16 LAB — LACTIC ACID, PLASMA: LACTIC ACID, VENOUS: 1.1 mmol/L (ref 0.5–1.9)

## 2016-10-16 IMAGING — CR DG CHEST 2V
1 series · 2 of 2 positions shown · non-contrast
Comparison: 02/14/2016

CLINICAL DATA: Lower abdominal pain, vomiting, shortness of breath

EXAM:
CHEST  2 VIEW

[Series 1: dg chest 2 view · 0.14mm/px · 2 of 2 slices shown]
[im 1/2]
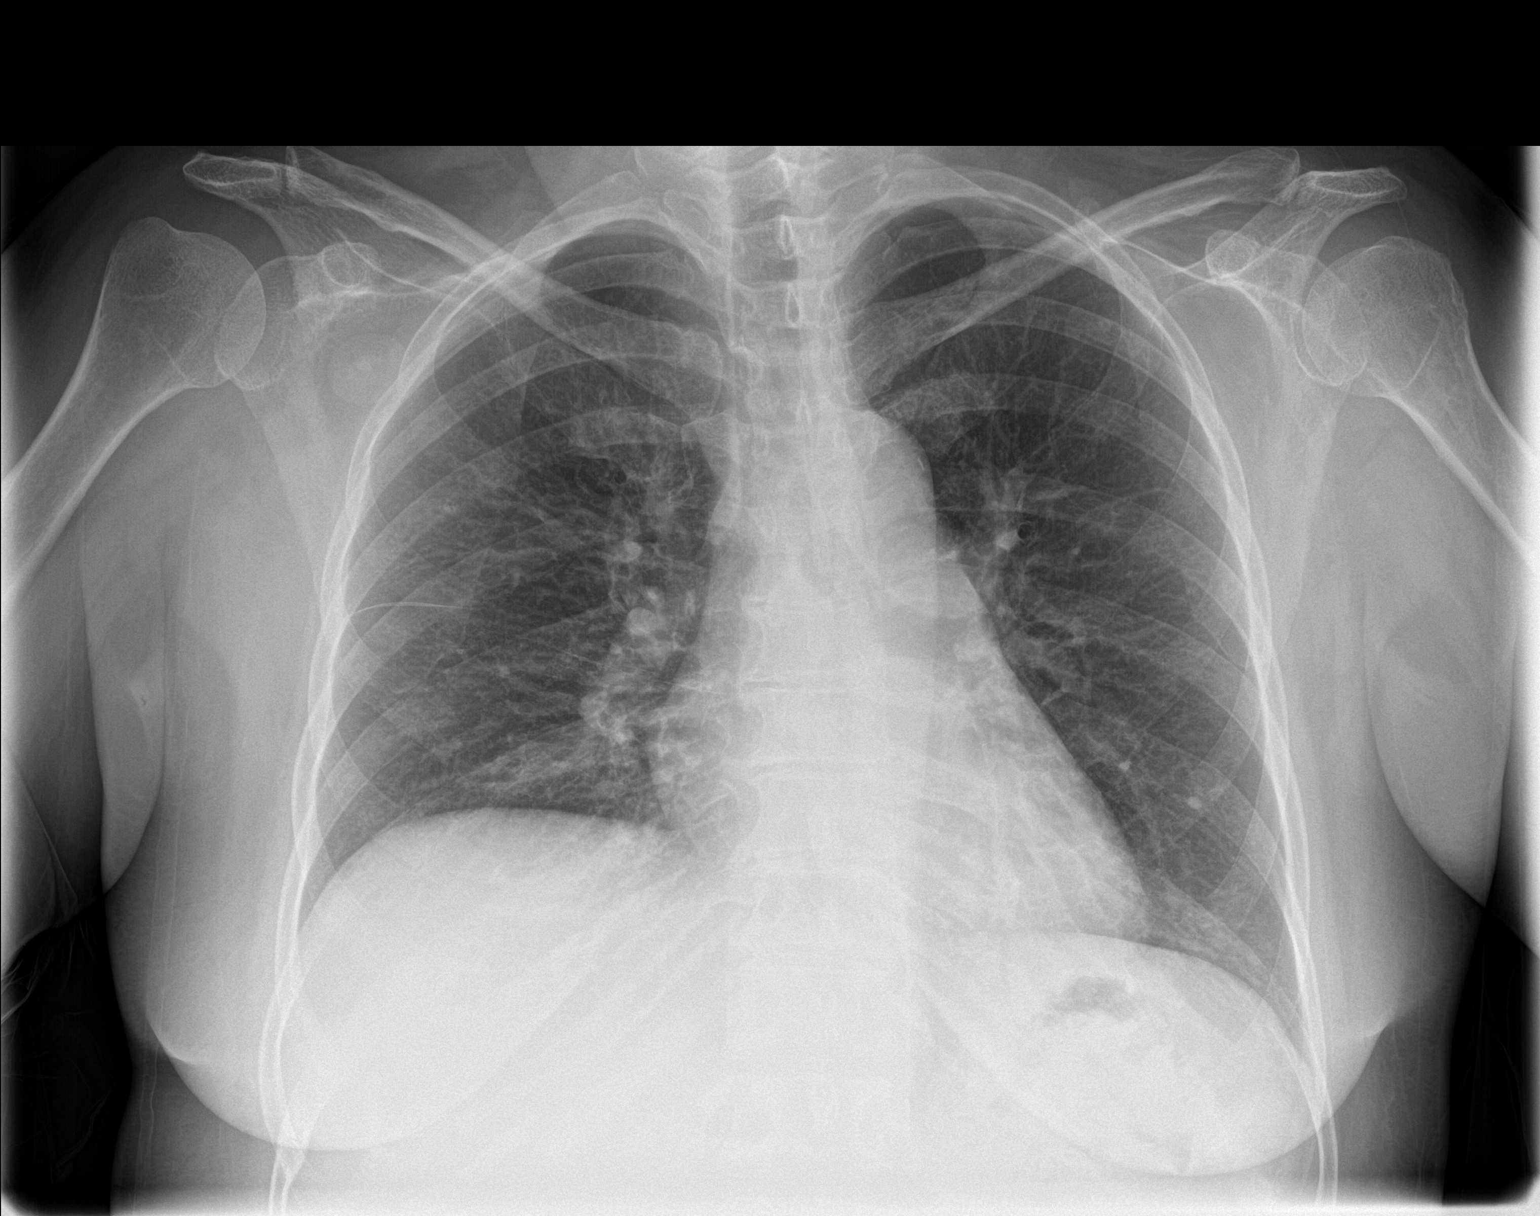
[im 2/2]
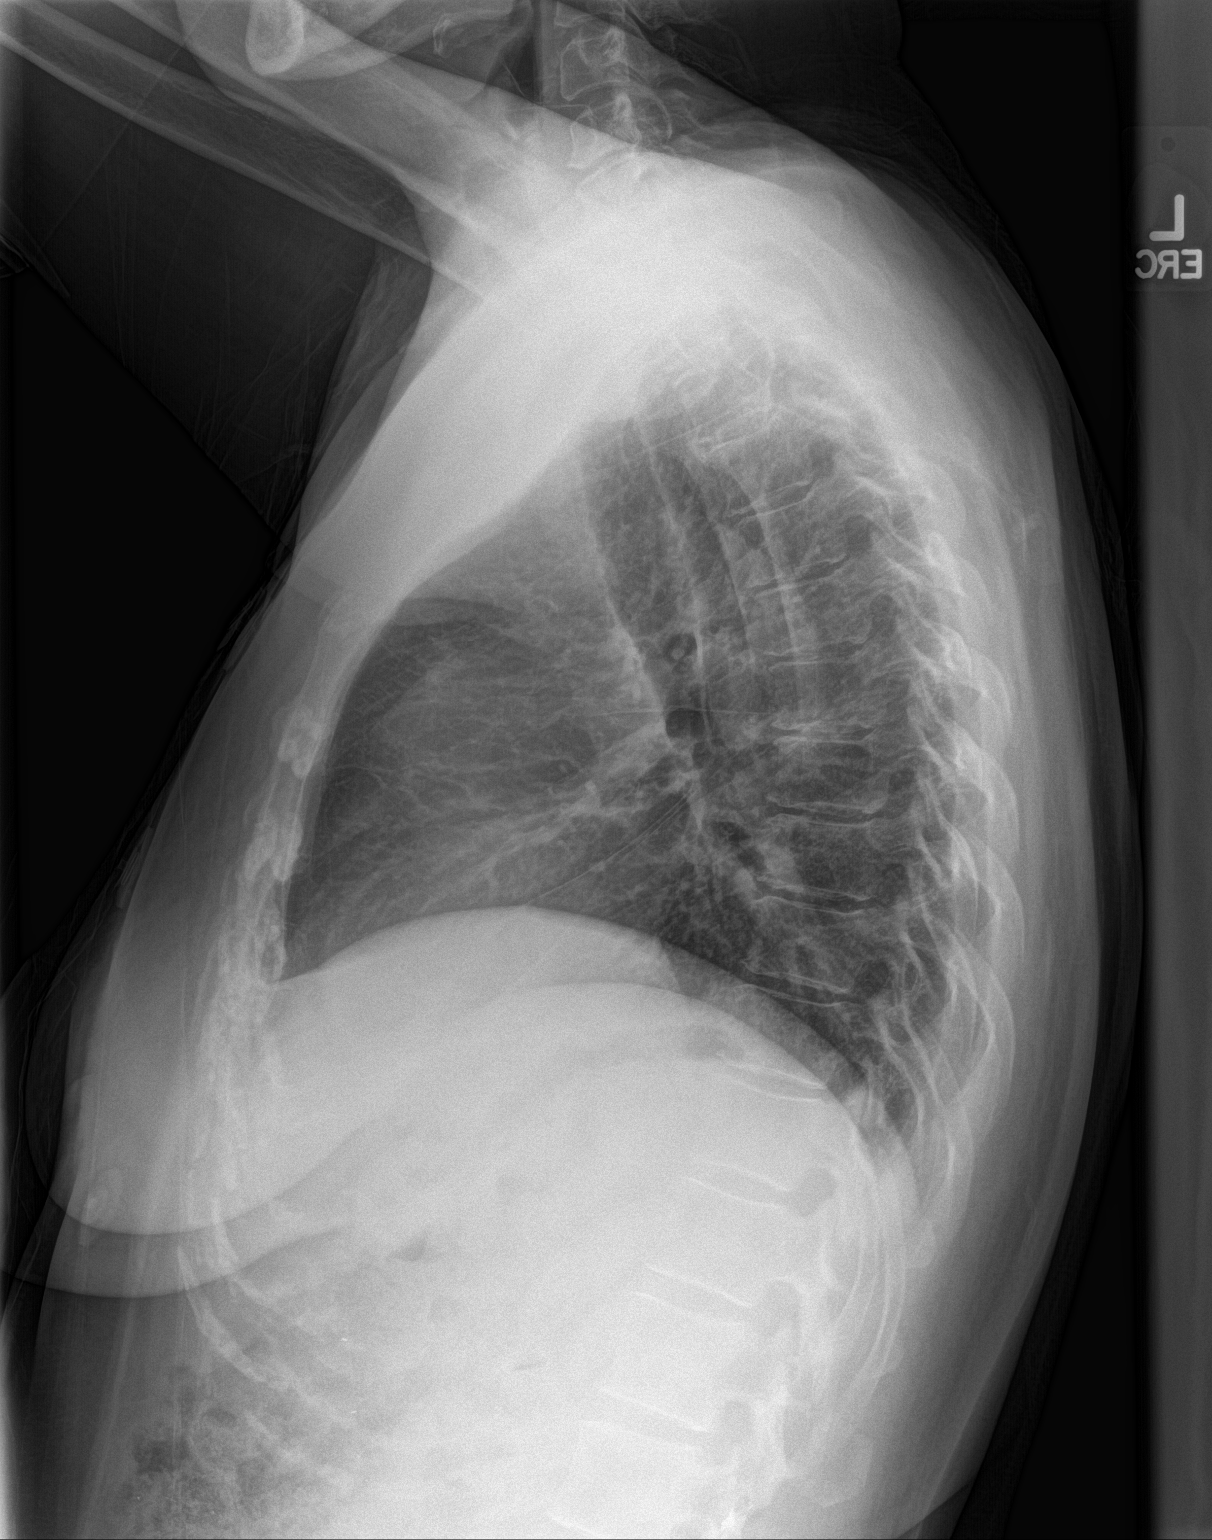

[2 of 2 positions shown; findings below may reference images not displayed]

FINDINGS: Lungs are essentially clear. No focal consolidation. Mild pleural
thickening/fluid along the right major fissure. No pneumothorax.

The heart is normal in size.

Mild degenerative changes of the visualized thoracolumbar spine.
IMPRESSION: No evidence of acute cardiopulmonary disease.

## 2016-10-16 MED ORDER — OXYCODONE HCL 5 MG PO TABS
5.0000 mg | ORAL_TABLET | Freq: Three times a day (TID) | ORAL | Status: DC | PRN
  Administered 2016-10-17: 11:00:00 5 mg via ORAL
  Filled 2016-10-16: qty 1

## 2016-10-16 MED ORDER — DEXTROSE 5 % IV SOLN: 1.0000 g | Freq: Once | INTRAVENOUS | Status: DC

## 2016-10-16 MED ORDER — ACETAMINOPHEN 650 MG RE SUPP: 650.0000 mg | Freq: Four times a day (QID) | RECTAL | Status: DC | PRN

## 2016-10-16 MED ORDER — ASPIRIN EC 81 MG PO TBEC
81.0000 mg | DELAYED_RELEASE_TABLET | Freq: Every day | ORAL | Status: DC
  Administered 2016-10-17 – 2016-10-19 (×3): 81 mg via ORAL
  Filled 2016-10-16 (×3): qty 1

## 2016-10-16 MED ORDER — INSULIN ASPART 100 UNIT/ML ~~LOC~~ SOLN
0.0000 [IU] | Freq: Three times a day (TID) | SUBCUTANEOUS | Status: DC
Start: 2016-10-17 — End: 2016-10-19
  Administered 2016-10-17: 2 [IU] via SUBCUTANEOUS
  Administered 2016-10-17: 5 [IU] via SUBCUTANEOUS
  Administered 2016-10-17 – 2016-10-18 (×4): 2 [IU] via SUBCUTANEOUS
  Administered 2016-10-19 (×2): 3 [IU] via SUBCUTANEOUS
  Filled 2016-10-16 (×5): qty 2
  Filled 2016-10-16 (×2): qty 3
  Filled 2016-10-16: qty 1

## 2016-10-16 MED ORDER — GLIPIZIDE 10 MG PO TABS
10.0000 mg | ORAL_TABLET | Freq: Two times a day (BID) | ORAL | Status: DC
  Administered 2016-10-17 – 2016-10-19 (×5): 10 mg via ORAL
  Filled 2016-10-16 (×6): qty 1

## 2016-10-16 MED ORDER — INSULIN ASPART 100 UNIT/ML ~~LOC~~ SOLN: 10.0000 [IU] | Freq: Once | SUBCUTANEOUS | Status: DC

## 2016-10-16 MED ORDER — ENOXAPARIN SODIUM 40 MG/0.4ML ~~LOC~~ SOLN
40.0000 mg | SUBCUTANEOUS | Status: DC
  Administered 2016-10-16 – 2016-10-18 (×3): 40 mg via SUBCUTANEOUS
  Filled 2016-10-16 (×3): qty 0.4

## 2016-10-16 MED ORDER — CEFTRIAXONE SODIUM-DEXTROSE 1-3.74 GM-% IV SOLR: 1.0000 g | INTRAVENOUS | Status: DC

## 2016-10-16 MED ORDER — INSULIN ASPART 100 UNIT/ML ~~LOC~~ SOLN
10.0000 [IU] | Freq: Once | SUBCUTANEOUS | Status: AC
  Administered 2016-10-16: 23:00:00 10 [IU] via SUBCUTANEOUS
  Filled 2016-10-16: qty 10

## 2016-10-16 MED ORDER — CEFTRIAXONE SODIUM-DEXTROSE 1-3.74 GM-% IV SOLR
1.0000 g | Freq: Once | INTRAVENOUS | Status: AC
  Administered 2016-10-16: 1 g via INTRAVENOUS
  Filled 2016-10-16: qty 50

## 2016-10-16 MED ORDER — INSULIN ASPART 100 UNIT/ML ~~LOC~~ SOLN
0.0000 [IU] | Freq: Every day | SUBCUTANEOUS | Status: DC
Start: 2016-10-16 — End: 2016-10-19
  Administered 2016-10-18: 2 [IU] via SUBCUTANEOUS
  Filled 2016-10-16: qty 2
  Filled 2016-10-16: qty 5

## 2016-10-16 MED ORDER — INSULIN DETEMIR 100 UNIT/ML ~~LOC~~ SOLN
8.0000 [IU] | Freq: Two times a day (BID) | SUBCUTANEOUS | Status: DC
  Administered 2016-10-16 – 2016-10-19 (×6): 8 [IU] via SUBCUTANEOUS
  Filled 2016-10-16 (×7): qty 0.08

## 2016-10-16 MED ORDER — DEXTROSE 5 % IV SOLN: 1.0000 g | INTRAVENOUS | Status: DC

## 2016-10-16 MED ORDER — ACETAMINOPHEN 325 MG PO TABS
650.0000 mg | ORAL_TABLET | Freq: Four times a day (QID) | ORAL | Status: DC | PRN
  Administered 2016-10-17 (×2): 650 mg via ORAL
  Filled 2016-10-16 (×3): qty 2

## 2016-10-16 MED ORDER — SODIUM CHLORIDE 0.9 % IV SOLN
1000.0000 mL | Freq: Once | INTRAVENOUS | Status: AC
  Administered 2016-10-16: 1000 mL via INTRAVENOUS

## 2016-10-16 NOTE — ED Provider Notes (Signed)
[facility] Emergency Department Provider Note   ____________________________________________    I have reviewed the triage vital signs and the nursing notes.   HISTORY  Chief Complaint Abdominal Pain; Fever; and Emesis  Spanish interpreter used   HPI Ruth Walters is a 50 y.o. female who presents with complaints of lower abdominal pain, dysuria fever and nausea. Patient reports worsening dysuria over the last several days, with fever developing today. She reports nausea also developed today. She does have mild back pain. She does have a history of diabetes. She denies diarrhea.   Past Medical History:  Diagnosis Date  . Cancer ([place])   . Diabetes mellitus without complication ([place])   . Low blood pressure     Patient Active Problem List   Diagnosis Date Noted  . Sepsis ([place]) 02/14/2016  . HCAP (healthcare-associated pneumonia) 02/14/2016  . Multiple myeloma ([place]) 02/14/2016  . Type 2 diabetes mellitus ([place]) 02/14/2016    Past Surgical History:  Procedure Laterality Date  . ABDOMINAL HYSTERECTOMY      Prior to Admission medications   Medication Sig Start Date End Date Taking? Authorizing Provider  aspirin EC 81 MG tablet Take 81 mg by mouth daily. 11/13/14   Historical Provider, MD  ciprofloxacin (CIPRO) 250 MG tablet Take 1 tablet (250 mg total) by mouth 2 (two) times daily. 02/17/16   Henreitta Leber, MD  dexamethasone (DECADRON) 4 MG tablet Take 20 mg by mouth every Wednesday. 01/07/16   Historical Provider, MD  glipiZIDE (GLUCOTROL) 10 MG tablet Take 10 mg by mouth 2 (two) times daily before a meal. 06/18/15   Historical Provider, MD  insulin NPH Human (HUMULIN N) 100 UNIT/ML injection Inject 0.08 mLs (8 Units total) into the skin 2 (two) times daily before a meal. 02/17/16   Henreitta Leber, MD  metFORMIN (GLUMETZA) 1000 MG (MOD) 24 hr tablet Take 2,000 mg by mouth daily before breakfast. 10/01/15 09/30/16  Historical Provider, MD    oxyCODONE (ROXICODONE) 5 MG immediate release tablet Take 5 mg by mouth every 8 (eight) hours as needed. For pain. 02/04/16 02/03/17  Historical Provider, MD  pomalidomide (POMALYST) 3 MG capsule Take 3 mg by mouth daily. Take for 21 days. Rest for 7 days. Restart as soon as you get. 02/13/16   Historical Provider, MD     Allergies Review of patient's allergies indicates no known allergies.  Family History  Problem Relation Age of Onset  . Leukemia Sister     Social History Social History  Substance Use Topics  . Smoking status: Never Smoker  . Smokeless tobacco: Never Used  . Alcohol use No    Review of Systems  Constitutional: Fevers Eyes: Mild blurry vision ENT: No sore throat. Cardiovascular: Denies chest pain. Respiratory: Cough Gastrointestinal: Suprapubic cramping discomfort Genitourinary: Frequency and dysuria Musculoskeletal: No joint swelling Skin: Negative for rash. Neurological: Negative for headaches   10-point ROS otherwise negative.  ____________________________________________   PHYSICAL EXAM:  VITAL SIGNS: ED Triage Vitals  Enc Vitals Group     BP 10/16/16 1935 (!) 95/55     Pulse Rate 10/16/16 1935 (!) 120     Resp 10/16/16 1935 18     Temp 10/16/16 1935 (!) 102.5 F (39.2 C)     Temp Source 10/16/16 1935 Oral     SpO2 10/16/16 1935 96 %     Weight 10/16/16 1935 120 lb (54.4 kg)     Height --      Head  Circumference --      Peak Flow --      Pain Score 10/16/16 1936 8     Pain Loc --      Pain Edu? --      Excl. in [place]? --     Constitutional: Alert and oriented. Ill-appearing  Eyes: Conjunctivae are normal.   Nose: No congestion/rhinnorhea. Mouth/Throat: Mucous membranes are moist.   Neck:  Painless ROM, no meningismus Cardiovascular: Tachycardia, regular rhythm. Grossly normal heart sounds.  Good peripheral circulation. Respiratory: Normal respiratory effort.  No retractions. Lungs CTAB. Gastrointestinal: Soft and nontender. No  distention.  Mild right CVA tenderness Genitourinary: deferred Musculoskeletal: No lower extremity tenderness nor edema.  Warm and well perfused Neurologic:  Normal speech and language. No gross focal neurologic deficits are appreciated.  Skin:  Skin is warm, dry and intact. No rash noted. Psychiatric: Mood and affect are normal. Speech and behavior are normal.  ____________________________________________   LABS (all labs ordered are listed, but only abnormal results are displayed)  Labs Reviewed  COMPREHENSIVE METABOLIC PANEL - Abnormal; Notable for the following:       Result Value   Sodium 133 (*)    Chloride 98 (*)    Glucose, Bld 472 (*)    Calcium 8.7 (*)    Albumin 3.4 (*)    AST 13 (*)    ALT 11 (*)    Alkaline Phosphatase 167 (*)    All other components within normal limits  CBC WITH DIFFERENTIAL/PLATELET - Abnormal; Notable for the following:    WBC 14.2 (*)    Neutro Abs 12.8 (*)    Lymphs Abs 0.7 (*)    All other components within normal limits  URINALYSIS COMPLETEWITH MICROSCOPIC ([facility] ONLY) - Abnormal; Notable for the following:    Color, Urine YELLOW (*)    APPearance CLOUDY (*)    Glucose, UA >500 (*)    Ketones, ur 1+ (*)    Hgb urine dipstick 1+ (*)    Protein, ur 100 (*)    Nitrite POSITIVE (*)    Leukocytes, UA 3+ (*)    Bacteria, UA FEW (*)    Squamous Epithelial / LPF 0-5 (*)    All other components within normal limits  CULTURE, BLOOD (ROUTINE X 2)  CULTURE, BLOOD (ROUTINE X 2)  URINE CULTURE  LACTIC ACID, PLASMA  LACTIC ACID, PLASMA   ____________________________________________  EKG  ED ECG REPORT I, Lavonia Drafts, the attending physician, personally viewed and interpreted this ECG.  Date: 10/16/2016 EKG Time: 7:48 PM Rate: 121 Rhythm: Sinus tachycardia QRS Axis: normal Intervals: normal ST/T Wave abnormalities: normal Conduction Disturbances: Left anterior fascicular  block   ____________________________________________  RADIOLOGY  Chest x-ray unremarkable ____________________________________________   PROCEDURES  Procedure(s) performed: No    Critical Care performed: No ____________________________________________   INITIAL IMPRESSION / ASSESSMENT AND PLAN / ED COURSE  Pertinent labs & imaging results that were available during my care of the patient were reviewed by me and considered in my medical decision making (see chart for details).  Patient presents with fever, tachycardia, diabetes and dysuria. Code sepsis called no evidence of organ dysfunction, lactic 1.1. Suspect urinary tract infection.  Clinical Course  ----------------------------------------- 8:51 PM on 10/16/2016 -----------------------------------------  Urinalysis confirms urinary tract infection. We will start Rocephin IV and admit to the hospital. ____________________________________________   FINAL CLINICAL IMPRESSION(S) / ED DIAGNOSES  Final diagnoses:  Sepsis, due to unspecified organism ([place])  Acute cystitis with hematuria  NEW MEDICATIONS STARTED DURING THIS VISIT:  New Prescriptions   No medications on file     Note:  This document was prepared using Dragon voice recognition software and may include unintentional dictation errors.    Lavonia Drafts, MD 10/16/16 2052

## 2016-10-16 NOTE — ED Triage Notes (Signed)
Patient with complaint of lower abdominal pain that started yesterday. Patient also reports vomiting times one today.

## 2016-10-16 NOTE — ED Notes (Signed)
Patient transported to X-ray 

## 2016-10-16 NOTE — H&P (Addendum)
[facility] [place] NAME: Ruth Walters    MR#:  [id number]  DATE OF BIRTH:  06/08/1966  DATE OF ADMISSION:  10/16/2016  PRIMARY CARE PHYSICIAN: [facility]  REQUESTING/REFERRING PHYSICIAN: Dr. Lavonia Drafts  CHIEF COMPLAINT:   Chief Complaint  Patient presents with  . Abdominal Pain  . Fever  . Emesis    HISTORY OF PRESENT ILLNESS:  Ruth Walters  is a 50 y.o. female with a known history of Multiple myeloma presents with fever. Her temperature is been going up and down. She felt like she is going to pass out. She's been having pulsing pain all over her body and cramps in her feet. She's having lower abdominal pain and burning in her left back and with urination. She's had some nausea vomiting and she is very thirsty. In the ER she was found to have a temperature of 102.5, she is tachycardic and has leukocytosis. Urinalysis was also positive.History and physical helped with a translator provided by the hospital.  PAST MEDICAL HISTORY:   Past Medical History:  Diagnosis Date  . Cancer ([place])   . Diabetes mellitus without complication ([place])   . Low blood pressure     PAST SURGICAL HISTORY:   Past Surgical History:  Procedure Laterality Date  . ABDOMINAL HYSTERECTOMY      SOCIAL HISTORY:   Social History  Substance Use Topics  . Smoking status: Never Smoker  . Smokeless tobacco: Never Used  . Alcohol use No    FAMILY HISTORY:   Family History  Problem Relation Age of Onset  . Leukemia Sister   . Hyperlipidemia Mother     DRUG ALLERGIES:  No Known Allergies  REVIEW OF SYSTEMS:  CONSTITUTIONAL: Positive for fever, chills and sweats. Positive for weight gain. Positive for fatigue.  EYES: Positive for blurred vision EARS, NOSE, AND THROAT: Positive for runny nose. Positive for sore throat. Positive for dysphagia to solids. RESPIRATORY: Positive for cough and shortness of breath. No wheezing or  hemoptysis.  CARDIOVASCULAR: Positive for chest pain. No orthopnea, edema.  GASTROINTESTINAL: Positive for nausea, vomiting, and abdominal pain. Occasional blood in the bowel movements. Positive for constipation GENITOURINARY: Positive for burning on urination. Some whitish vaginal discharge ENDOCRINE: No polyuria, nocturia,  HEMATOLOGY: No anemia, easy bruising or bleeding SKIN: No rash or lesion. MUSCULOSKELETAL: Positive for joint pains NEUROLOGIC: Felt like she is to pass out PSYCHIATRY: No anxiety or depression.   MEDICATIONS AT HOME:   Prior to Admission medications   Medication Sig Start Date End Date Taking? Authorizing Provider  aspirin EC 81 MG tablet Take 81 mg by mouth daily. 11/13/14   Historical Provider, MD  ciprofloxacin (CIPRO) 250 MG tablet Take 1 tablet (250 mg total) by mouth 2 (two) times daily. 02/17/16   Henreitta Leber, MD  dexamethasone (DECADRON) 4 MG tablet Take 20 mg by mouth every Wednesday. 01/07/16   Historical Provider, MD  glipiZIDE (GLUCOTROL) 10 MG tablet Take 10 mg by mouth 2 (two) times daily before a meal. 06/18/15   Historical Provider, MD  insulin NPH Human (HUMULIN N) 100 UNIT/ML injection Inject 0.08 mLs (8 Units total) into the skin 2 (two) times daily before a meal. 02/17/16   Henreitta Leber, MD  metFORMIN (GLUMETZA) 1000 MG (MOD) 24 hr tablet Take 2,000 mg by mouth daily before breakfast. 10/01/15 09/30/16  Historical Provider, MD  oxyCODONE (ROXICODONE) 5 MG immediate release tablet Take 5 mg by mouth every 8 (  eight) hours as needed. For pain. 02/04/16 02/03/17  Historical Provider, MD  pomalidomide (POMALYST) 3 MG capsule Take 3 mg by mouth daily. Take for 21 days. Rest for 7 days. Restart as soon as you get. 02/13/16   Historical Provider, MD      VITAL SIGNS:  Blood pressure 110/62, pulse (!) 118, temperature (!) 102.5 F (39.2 C), temperature source Oral, resp. rate 20, weight 54.4 kg (120 lb), SpO2 94 %.  PHYSICAL EXAMINATION:  GENERAL:  50  y.o.-year-old patient lying in the bed with no acute distress.  EYES: Pupils equal, round, reactive to light and accommodation. No scleral icterus. Extraocular muscles intact.  HEENT: Head atraumatic, normocephalic. Oropharynx and nasopharynx clear.  NECK:  Supple, no jugular venous distention. No thyroid enlargement, no tenderness.  LUNGS: Normal breath sounds bilaterally, no wheezing, rales,rhonchi or crepitation. No use of accessory muscles of respiration.  CARDIOVASCULAR: S1, S2 normal. No murmurs, rubs, or gallops.  ABDOMEN: Soft, Suprapubic tenderness, nondistended. Bowel sounds present. No organomegaly or mass.  EXTREMITIES: No pedal edema, cyanosis, or clubbing.  NEUROLOGIC: Cranial nerves II through XII are intact. Muscle strength 5/5 in all extremities. Sensation intact. Gait not checked.  PSYCHIATRIC: The patient is alert and oriented x 3.  SKIN: No rash, lesion, or ulcer.   LABORATORY PANEL:   CBC  Recent Labs Lab 10/16/16 1950  WBC 14.2*  HGB 12.6  HCT 37.9  PLT 274   ------------------------------------------------------------------------------------------------------------------  Chemistries   Recent Labs Lab 10/16/16 1950  NA 133*  K 4.1  CL 98*  CO2 25  GLUCOSE 472*  BUN 19  CREATININE 0.60  CALCIUM 8.7*  AST 13*  ALT 11*  ALKPHOS 167*  BILITOT 0.6   ------------------------------------------------------------------------------------------------------------------  Cardiac Enzymes No results for input(s): TROPONINI in the last 168 hours. ------------------------------------------------------------------------------------------------------------------  RADIOLOGY:  Dg Chest 2 View  Result Date: 10/16/2016 CLINICAL DATA:  Lower abdominal pain, vomiting, shortness of breath EXAM: CHEST  2 VIEW COMPARISON:  02/14/2016 FINDINGS: Lungs are essentially clear. No focal consolidation. Mild pleural thickening/fluid along the right major fissure. No  pneumothorax. The heart is normal in size. Mild degenerative changes of the visualized thoracolumbar spine. IMPRESSION: No evidence of acute cardiopulmonary disease. Electronically Signed   By: Julian Hy M.D.   On: 10/16/2016 20:33    EKG:   Sinus tachycardia 121 bpm, left anterior fascicular block  IMPRESSION AND PLAN:   1. Clinical sepsis with fever, tachycardia and leukocytosis. Urine analysis positive and the likely source of sepsis. Rocephin started in the ER and this will be continued. Follow-up urine cultures and blood cultures. 2. Relative hypotension. This is her normal. Continue IV fluid hydration. 3. Type 2 diabetes mellitus with hyperglycemia. I will put on sliding scale and continue her NPH insulin and her glipizide. Check a hemoglobin A1c. 4. Multiple myeloma. Follow-up as outpatient 5. Hyponatremia likely with dehydration. Hydrate with normal saline.  All the records are reviewed and case discussed with ED provider. Management plans discussed with the patient, family and they are in agreement.  CODE STATUS: Full code  TOTAL TIME TAKING CARE OF THIS PATIENT: 50 minutes.    Loletha Grayer M.D on 10/16/2016 at 9:26 PM  Between 7am to 6pm - Pager - [phone]  After 6pm call admission pager [phone]  [facility] Office  [phone]  CC: Primary care physician; [facility]

## 2016-10-17 ENCOUNTER — Other Ambulatory Visit: Payer: Self-pay

## 2016-10-17 LAB — BLOOD CULTURE ID PANEL (REFLEXED)
ACINETOBACTER BAUMANNII: NOT DETECTED
CANDIDA ALBICANS: NOT DETECTED
CANDIDA GLABRATA: NOT DETECTED
CANDIDA KRUSEI: NOT DETECTED
CANDIDA PARAPSILOSIS: NOT DETECTED
CANDIDA TROPICALIS: NOT DETECTED
Carbapenem resistance: NOT DETECTED
ENTEROBACTER CLOACAE COMPLEX: NOT DETECTED
ESCHERICHIA COLI: DETECTED — AB
Enterobacteriaceae species: DETECTED — AB
Enterococcus species: NOT DETECTED
Haemophilus influenzae: NOT DETECTED
KLEBSIELLA PNEUMONIAE: NOT DETECTED
Klebsiella oxytoca: NOT DETECTED
Listeria monocytogenes: NOT DETECTED
Neisseria meningitidis: NOT DETECTED
PROTEUS SPECIES: NOT DETECTED
Pseudomonas aeruginosa: NOT DETECTED
SERRATIA MARCESCENS: NOT DETECTED
STAPHYLOCOCCUS SPECIES: NOT DETECTED
Staphylococcus aureus (BCID): NOT DETECTED
Streptococcus agalactiae: NOT DETECTED
Streptococcus pneumoniae: NOT DETECTED
Streptococcus pyogenes: NOT DETECTED
Streptococcus species: NOT DETECTED

## 2016-10-17 LAB — CBC
HCT: 32.2 % — ABNORMAL LOW (ref 35.0–47.0)
HEMOGLOBIN: 10.9 g/dL — AB (ref 12.0–16.0)
MCH: 28.8 pg (ref 26.0–34.0)
MCHC: 33.8 g/dL (ref 32.0–36.0)
MCV: 85.1 fL (ref 80.0–100.0)
Platelets: 234 10*3/uL (ref 150–440)
RBC: 3.78 MIL/uL — ABNORMAL LOW (ref 3.80–5.20)
RDW: 13.6 % (ref 11.5–14.5)
WBC: 14.4 10*3/uL — ABNORMAL HIGH (ref 3.6–11.0)

## 2016-10-17 LAB — GLUCOSE, CAPILLARY
GLUCOSE-CAPILLARY: 162 mg/dL — AB (ref 65–99)
GLUCOSE-CAPILLARY: 193 mg/dL — AB (ref 65–99)
GLUCOSE-CAPILLARY: 323 mg/dL — AB (ref 65–99)
GLUCOSE-CAPILLARY: 390 mg/dL — AB (ref 65–99)
GLUCOSE-CAPILLARY: 437 mg/dL — AB (ref 65–99)
Glucose-Capillary: 271 mg/dL — ABNORMAL HIGH (ref 65–99)
Glucose-Capillary: 196 mg/dL — ABNORMAL HIGH (ref 65–99)

## 2016-10-17 LAB — TROPONIN I: Troponin I: <0.03 ng/mL (ref <0.03)

## 2016-10-17 LAB — BASIC METABOLIC PANEL
Anion gap: 5 (ref 5–15)
BUN: 19 mg/dL (ref 6–20)
CALCIUM: 7.6 mg/dL — AB (ref 8.9–10.3)
CHLORIDE: 106 mmol/L (ref 101–111)
CO2: 25 mmol/L (ref 22–32)
CREATININE: 0.64 mg/dL (ref 0.44–1.00)
GFR calc non Af Amer: >60 mL/min (ref >60)
Glucose, Bld: 306 mg/dL — ABNORMAL HIGH (ref 65–99)
Potassium: 3.9 mmol/L (ref 3.5–5.1)
SODIUM: 136 mmol/L (ref 135–145)

## 2016-10-17 MED ORDER — POTASSIUM CHLORIDE IN NACL 20-0.9 MEQ/L-% IV SOLN
INTRAVENOUS | Status: DC
  Administered 2016-10-17 – 2016-10-19 (×4): via INTRAVENOUS
  Filled 2016-10-17 (×7): qty 1000

## 2016-10-17 MED ORDER — NITROGLYCERIN 0.4 MG SL SUBL
0.4000 mg | SUBLINGUAL_TABLET | SUBLINGUAL | Status: DC | PRN
  Administered 2016-10-17: 0.4 mg via SUBLINGUAL

## 2016-10-17 MED ORDER — IBUPROFEN 400 MG PO TABS
400.0000 mg | ORAL_TABLET | Freq: Three times a day (TID) | ORAL | Status: DC | PRN
  Administered 2016-10-17 – 2016-10-19 (×2): 400 mg via ORAL
  Filled 2016-10-17 (×3): qty 1

## 2016-10-17 MED ORDER — ONDANSETRON HCL 4 MG/2ML IJ SOLN
4.0000 mg | Freq: Four times a day (QID) | INTRAMUSCULAR | Status: DC | PRN
  Administered 2016-10-17 – 2016-10-18 (×5): 4 mg via INTRAVENOUS
  Filled 2016-10-17 (×5): qty 2

## 2016-10-17 MED ORDER — NITROGLYCERIN 0.4 MG SL SUBL
SUBLINGUAL_TABLET | SUBLINGUAL | Status: AC
  Filled 2016-10-17: qty 1

## 2016-10-17 MED ORDER — SODIUM CHLORIDE 0.9 % IV BOLUS (SEPSIS)
1000.0000 mL | INTRAVENOUS | Status: DC | PRN
  Administered 2016-10-17: 1000 mL via INTRAVENOUS
  Filled 2016-10-17: qty 1000

## 2016-10-17 MED ORDER — SODIUM CHLORIDE 0.9 % IV BOLUS (SEPSIS)
1000.0000 mL | Freq: Once | INTRAVENOUS | Status: AC
  Administered 2016-10-17: 1000 mL via INTRAVENOUS

## 2016-10-17 MED ORDER — INSULIN ASPART 100 UNIT/ML ~~LOC~~ SOLN
5.0000 [IU] | Freq: Once | SUBCUTANEOUS | Status: DC
  Filled 2016-10-17: qty 5

## 2016-10-17 MED ORDER — SODIUM CHLORIDE 0.9 % IV SOLN
1000.0000 mL | Freq: Once | INTRAVENOUS | Status: AC
  Administered 2016-10-17: 08:00:00 1000 mL via INTRAVENOUS

## 2016-10-17 MED ORDER — SODIUM CHLORIDE 0.9 % IV BOLUS (SEPSIS)
1000.0000 mL | Freq: Once | INTRAVENOUS | Status: AC
  Administered 2016-10-17: 13:00:00 1000 mL via INTRAVENOUS

## 2016-10-17 MED ORDER — MEROPENEM 1 G IV SOLR: 1.0000 g | Freq: Three times a day (TID) | INTRAVENOUS | Status: DC

## 2016-10-17 MED ORDER — MEROPENEM-SODIUM CHLORIDE 1 GM/50ML IV SOLR
1.0000 g | Freq: Three times a day (TID) | INTRAVENOUS | Status: DC
  Administered 2016-10-17 – 2016-10-18 (×4): 1 g via INTRAVENOUS
  Filled 2016-10-17 (×5): qty 50

## 2016-10-17 NOTE — Progress Notes (Signed)
PHARMACY - PHYSICIAN COMMUNICATION CRITICAL VALUE ALERT - BLOOD CULTURE IDENTIFICATION (BCID)  Results for orders placed or performed during the hospital encounter of 10/16/16  Blood Culture ID Panel (Reflexed) (Collected: 10/16/2016  7:50 PM)  Result Value Ref Range   Enterococcus species NOT DETECTED NOT DETECTED   Listeria monocytogenes NOT DETECTED NOT DETECTED   Staphylococcus species NOT DETECTED NOT DETECTED   Staphylococcus aureus NOT DETECTED NOT DETECTED   Streptococcus species NOT DETECTED NOT DETECTED   Streptococcus agalactiae NOT DETECTED NOT DETECTED   Streptococcus pneumoniae NOT DETECTED NOT DETECTED   Streptococcus pyogenes NOT DETECTED NOT DETECTED   Acinetobacter baumannii NOT DETECTED NOT DETECTED   Enterobacteriaceae species DETECTED (A) NOT DETECTED   Enterobacter cloacae complex NOT DETECTED NOT DETECTED   Escherichia coli DETECTED (A) NOT DETECTED   Klebsiella oxytoca NOT DETECTED NOT DETECTED   Klebsiella pneumoniae NOT DETECTED NOT DETECTED   Proteus species NOT DETECTED NOT DETECTED   Serratia marcescens NOT DETECTED NOT DETECTED   Carbapenem resistance NOT DETECTED NOT DETECTED   Haemophilus influenzae NOT DETECTED NOT DETECTED   Neisseria meningitidis NOT DETECTED NOT DETECTED   Pseudomonas aeruginosa NOT DETECTED NOT DETECTED   Candida albicans NOT DETECTED NOT DETECTED   Candida glabrata NOT DETECTED NOT DETECTED   Candida krusei NOT DETECTED NOT DETECTED   Candida parapsilosis NOT DETECTED NOT DETECTED   Candida tropicalis NOT DETECTED NOT DETECTED    Name of physician (or Provider) Contacted: Dr. Darvin Neighbours  Changes to prescribed antibiotics required: Pt currently receiving ceftriaxone 1 g IV q 24. Recommended changing antibiotics to meropenem 1 gm IV every 8 hours based on ESBL rates at [facility]. Physician agreed. Pharmacy will monitor sensitivities and adjust accordingly.   Darrow Bussing, PharmD Pharmacy Resident 10/17/2016 11:45 AM

## 2016-10-17 NOTE — Plan of Care (Signed)
Problem: Education: Goal: Knowledge of [facility] Education information/materials will improve Outcome: Progressing Pt a&Ox4. Pt speaks spanish only. Interpreter used.  Problem: Pain Managment: Goal: General experience of comfort will improve Outcome: Progressing Pt c/o headache and lower abdominal pain. Pain meds given with improvement.  Problem: Physical Regulation: Goal: Ability to maintain clinical measurements within normal limits will improve Outcome: Progressing Hypotensive in am, 2xboluses given with improvement.  Problem: Fluid Volume: Goal: Ability to maintain a balanced intake and output will improve Outcome: Not Progressing Poor intake. Pt encouraged to eat and drink. C/o nausea once. Zofran given with improvement. IVF infusing. CBG monitored.

## 2016-10-17 NOTE — Plan of Care (Signed)
Problem: Education: Goal: Knowledge of [facility] Education information/materials will improve Outcome: Progressing Pt likes to be called Ruth Walters Pt speaks only Spanish  Past Medical History:  Diagnosis Date  . Cancer ([place])   . Diabetes mellitus without complication ([place])   . Low blood pressure    Pt is well controlled with home medications

## 2016-10-17 NOTE — Progress Notes (Signed)
Pt c/o chest pressure with a pain 7 out of 10, heart rate increased to 130's to140's, B/P 151/92. Activated Rapid Response, administered Triglycerin subl, x's one, 12 Lead EKG was performed, and a Troponin, serum was performed, plus one L of N/S was administered per MD's orders. Heart improved to low 100's to 110's, chest pain improved, MD notified of EKG results. Continue to monitor

## 2016-10-17 NOTE — Clinical Social Work Note (Signed)
CSW received consult for medication assistance and dc planning. CSW informed RNCM. CSW signing off.  Santiago Bumpers, MSW, LCSW-A [phone]

## 2016-10-17 NOTE — Progress Notes (Signed)
[facility] [place] NAME: Yemaya Barnier    MR#:  [id number]  DATE OF BIRTH:  04-30-1966  SUBJECTIVE:  CHIEF COMPLAINT:   Chief Complaint  Patient presents with  . Abdominal Pain  . Fever  . Emesis   Has lower abd pain and back pain Febrile. Nausea. No dysuria. Poor appetite.  REVIEW OF SYSTEMS:    Review of Systems  Constitutional: Positive for malaise/fatigue. Negative for chills and fever.  HENT: Negative for sore throat.   Eyes: Negative for blurred vision, double vision and pain.  Respiratory: Negative for cough, hemoptysis, shortness of breath and wheezing.   Cardiovascular: Negative for chest pain, palpitations, orthopnea and leg swelling.  Gastrointestinal: Positive for abdominal pain, nausea and vomiting. Negative for constipation, diarrhea and heartburn.  Genitourinary: Negative for dysuria and hematuria.  Musculoskeletal: Positive for back pain. Negative for joint pain.  Skin: Negative for rash.  Neurological: Positive for weakness and headaches. Negative for sensory change, speech change and focal weakness.  Endo/Heme/Allergies: Does not bruise/bleed easily.  Psychiatric/Behavioral: Negative for depression. The patient is not nervous/anxious.     DRUG ALLERGIES:  No Known Allergies  VITALS:  Blood pressure (!) 104/58, pulse (!) 110, temperature 98 F (36.7 C), temperature source Oral, resp. rate 18, height 4' 9" (1.448 m), weight 59.4 kg (131 lb), SpO2 96 %.  PHYSICAL EXAMINATION:   Physical Exam  GENERAL:  50 y.o.-year-old patient lying in the bed with no acute distress.  EYES: Pupils equal, round, reactive to light and accommodation. No scleral icterus. Extraocular muscles intact.  HEENT: Head atraumatic, normocephalic. Oropharynx and nasopharynx clear.  NECK:  Supple, no jugular venous distention. No thyroid enlargement, no tenderness.  LUNGS: Normal breath sounds bilaterally, no wheezing, rales, rhonchi.  No use of accessory muscles of respiration.  CARDIOVASCULAR: S1, S2 normal. No murmurs, rubs, or gallops.  ABDOMEN: Soft, suprapubic tenderness, nondistended. Bowel sounds present. No organomegaly or mass.  EXTREMITIES: No cyanosis, clubbing or edema b/l.    NEUROLOGIC: Cranial nerves II through XII are intact. No focal Motor or sensory deficits b/l.   PSYCHIATRIC: The patient is alert and oriented x 3.  SKIN: No obvious rash, lesion, or ulcer.   LABORATORY PANEL:   CBC  Recent Labs Lab 10/17/16 0643  WBC 14.4*  HGB 10.9*  HCT 32.2*  PLT 234   ------------------------------------------------------------------------------------------------------------------ Chemistries   Recent Labs Lab 10/16/16 1950 10/17/16 0643  NA 133* 136  K 4.1 3.9  CL 98* 106  CO2 25 25  GLUCOSE 472* 306*  BUN 19 19  CREATININE 0.60 0.64  CALCIUM 8.7* 7.6*  AST 13*  --   ALT 11*  --   ALKPHOS 167*  --   BILITOT 0.6  --    ------------------------------------------------------------------------------------------------------------------  Cardiac Enzymes  Recent Labs Lab 10/17/16 0643  TROPONINI <0.03   ------------------------------------------------------------------------------------------------------------------  RADIOLOGY:  Dg Chest 2 View  Result Date: 10/16/2016 CLINICAL DATA:  Lower abdominal pain, vomiting, shortness of breath EXAM: CHEST  2 VIEW COMPARISON:  02/14/2016 FINDINGS: Lungs are essentially clear. No focal consolidation. Mild pleural thickening/fluid along the right major fissure. No pneumothorax. The heart is normal in size. Mild degenerative changes of the visualized thoracolumbar spine. IMPRESSION: No evidence of acute cardiopulmonary disease. Electronically Signed   By: Julian Hy M.D.   On: 10/16/2016 20:33     ASSESSMENT AND PLAN:   * E coli bacteremia secondary to UTI with sepsis Patient on IV ceftriaxone. We'll change  to meropenem due to concern for  ESBL. IV fluid bolus normal saline given due to hypotension and tachycardia earlier. Check US renal Bolus 1 liter NS again  * Insulin-dependent diabetes mellitus Uncontrolled likely due to acute infection. Continue glipizide, Levemir, sliding scale insulin.  * Multiple myeloma Patient follows up at [place] [facility].  All the records are reviewed and case discussed with Care Management/Social Workerr. Management plans discussed with the patient, family and they are in agreement.  CODE STATUS: FULL CODE  DVT Prophylaxis: SCDs  TOTAL TIME TAKING CARE OF THIS PATIENT: 40 minutes critical care  POSSIBLE D/C IN 2-3 DAYS, DEPENDING ON CLINICAL CONDITION.  Hillary Bow R M.D on 10/17/2016 at 11:55 AM  Between 7am to 6pm - Pager - [phone]  After 6pm go to [email or web address] - password [email or web address] [facility]  Office  [phone]  CC: Primary care physician; No primary care provider on file.  Note: This dictation was prepared with Dragon dictation along with smaller phrase technology. Any transcriptional errors that result from this process are unintentional.

## 2016-10-17 NOTE — Progress Notes (Signed)
1 liter Bolus of N/S per MD's order for B/P 79/44 to run at 500 ml/hr.  Continue to monitor

## 2016-10-18 ENCOUNTER — Inpatient Hospital Stay: Payer: Self-pay

## 2016-10-18 LAB — GLUCOSE, CAPILLARY
GLUCOSE-CAPILLARY: 160 mg/dL — AB (ref 65–99)
GLUCOSE-CAPILLARY: 180 mg/dL — AB (ref 65–99)
GLUCOSE-CAPILLARY: 235 mg/dL — AB (ref 65–99)
Glucose-Capillary: 163 mg/dL — ABNORMAL HIGH (ref 65–99)

## 2016-10-18 MED ORDER — SODIUM CHLORIDE 0.9 % IV SOLN
1.0000 g | INTRAVENOUS | Status: DC
  Administered 2016-10-18: 22:00:00 1 g via INTRAVENOUS
  Filled 2016-10-18 (×2): qty 1

## 2016-10-18 NOTE — Progress Notes (Signed)
[place] at [place] NAME: Ruth Walters    MR#:  [id number]  DATE OF BIRTH:  08-03-1966  SUBJECTIVE:  CHIEF COMPLAINT:   Chief Complaint  Patient presents with  . Abdominal Pain  . Fever  . Emesis   Feels better with her back pain and headache. Afebrile. Continues to have some nausea and vomiting. Improving.  REVIEW OF SYSTEMS:    Review of Systems  Constitutional: Positive for malaise/fatigue. Negative for chills and fever.  HENT: Negative for sore throat.   Eyes: Negative for blurred vision, double vision and pain.  Respiratory: Negative for cough, hemoptysis, shortness of breath and wheezing.   Cardiovascular: Negative for chest pain, palpitations, orthopnea and leg swelling.  Gastrointestinal: Positive for abdominal pain, nausea and vomiting. Negative for constipation, diarrhea and heartburn.  Genitourinary: Negative for dysuria and hematuria.  Musculoskeletal: Positive for back pain. Negative for joint pain.  Skin: Negative for rash.  Neurological: Positive for weakness and headaches. Negative for sensory change, speech change and focal weakness.  Endo/Heme/Allergies: Does not bruise/bleed easily.  Psychiatric/Behavioral: Negative for depression. The patient is not nervous/anxious.     DRUG ALLERGIES:  No Known Allergies  VITALS:  Blood pressure (!) 109/59, pulse 99, temperature 98.3 F (36.8 C), resp. rate 20, height 4' 9"  (1.448 m), weight 59.4 kg (131 lb), SpO2 95 %.  PHYSICAL EXAMINATION:   Physical Exam  GENERAL:  50 y.o.-year-old patient lying in the bed with no acute distress.  EYES: Pupils equal, round, reactive to light and accommodation. No scleral icterus. Extraocular muscles intact.  HEENT: Head atraumatic, normocephalic. Oropharynx and nasopharynx clear.  NECK:  Supple, no jugular venous distention. No thyroid enlargement, no tenderness.  LUNGS: Normal breath sounds bilaterally, no wheezing, rales,  rhonchi. No use of accessory muscles of respiration.  CARDIOVASCULAR: S1, S2 normal. No murmurs, rubs, or gallops.  ABDOMEN: Soft, suprapubic tenderness, nondistended. Bowel sounds present. No organomegaly or mass.  EXTREMITIES: No cyanosis, clubbing or edema b/l.    NEUROLOGIC: Cranial nerves II through XII are intact. No focal Motor or sensory deficits b/l.   PSYCHIATRIC: The patient is alert and oriented x 3.  SKIN: No obvious rash, lesion, or ulcer.   LABORATORY PANEL:   CBC  Recent Labs Lab 10/17/16 0643  WBC 14.4*  HGB 10.9*  HCT 32.2*  PLT 234   ------------------------------------------------------------------------------------------------------------------ Chemistries   Recent Labs Lab 10/16/16 1950 10/17/16 0643  NA 133* 136  K 4.1 3.9  CL 98* 106  CO2 25 25  GLUCOSE 472* 306*  BUN 19 19  CREATININE 0.60 0.64  CALCIUM 8.7* 7.6*  AST 13*  --   ALT 11*  --   ALKPHOS 167*  --   BILITOT 0.6  --    ------------------------------------------------------------------------------------------------------------------  Cardiac Enzymes  Recent Labs Lab 10/17/16 1901  TROPONINI <0.03   ------------------------------------------------------------------------------------------------------------------  RADIOLOGY:  Dg Chest 2 View  Result Date: 10/16/2016 CLINICAL DATA:  Lower abdominal pain, vomiting, shortness of breath EXAM: CHEST  2 VIEW COMPARISON:  02/14/2016 FINDINGS: Lungs are essentially clear. No focal consolidation. Mild pleural thickening/fluid along the right major fissure. No pneumothorax. The heart is normal in size. Mild degenerative changes of the visualized thoracolumbar spine. IMPRESSION: No evidence of acute cardiopulmonary disease. Electronically Signed   By: Julian Hy M.D.   On: 10/16/2016 20:33   US Renal  Result Date: 10/18/2016 CLINICAL DATA:  UTI EXAM: RENAL / URINARY TRACT ULTRASOUND COMPLETE COMPARISON:  02/17/2016 FINDINGS:  Right Kidney: Length: 13.6 cm. Echogenicity within normal limits. No mass or hydronephrosis visualized. Left Kidney: Length: 13.0 cm. Echogenicity within normal limits. No mass or hydronephrosis visualized. Bladder: Appears normal for degree of bladder distention. IMPRESSION: No acute abnormality noted. Electronically Signed   By: Inez Catalina M.D.   On: 10/18/2016 10:00     ASSESSMENT AND PLAN:   * E coli bacteremia secondary to UTI with sepsis On meropenem Improving Renal ultrasound showed no obstruction or hydronephrosis IV fluids  * Insulin-dependent diabetes mellitus Uncontrolled likely due to acute infection. Continue glipizide, Levemir, sliding scale insulin.  * Multiple myeloma Patient follows up at [place] [facility].  All the records are reviewed and case discussed with Care Management/Social Workerr. Management plans discussed with the patient, family and they are in agreement.  CODE STATUS: FULL CODE  DVT Prophylaxis: SCDs  TOTAL TIME TAKING CARE OF THIS PATIENT: 40 minutes critical care  POSSIBLE D/C IN 2-3 DAYS, DEPENDING ON CLINICAL CONDITION.  Hillary Bow R M.D on 10/18/2016 at 12:48 PM  Between 7am to 6pm - Pager - [phone]  After 6pm go to [email or web address] - password [email or web address] [facility]  Office  [phone]  CC: Primary care physician; No primary care provider on file.  Note: This dictation was prepared with Dragon dictation along with smaller phrase technology. Any transcriptional errors that result from this process are unintentional.

## 2016-10-18 NOTE — Plan of Care (Signed)
Problem: Fluid Volume: Goal: Ability to maintain a balanced intake and output will improve Outcome: Not Progressing Pt continues to be nauseated,  Zofran given x's 2 this shift. 2nd dose given after 700 ml of yellow thin emesis.

## 2016-10-19 LAB — CBC WITH DIFFERENTIAL/PLATELET
BASOS ABS: 0 10*3/uL (ref 0–0.1)
Basophils Relative: 0 %
EOS PCT: 0 %
Eosinophils Absolute: 0 10*3/uL (ref 0–0.7)
HEMATOCRIT: 29.7 % — AB (ref 35.0–47.0)
Hemoglobin: 10.1 g/dL — ABNORMAL LOW (ref 12.0–16.0)
LYMPHS ABS: 1 10*3/uL (ref 1.0–3.6)
LYMPHS PCT: 14 %
MCH: 28.8 pg (ref 26.0–34.0)
MCHC: 34.2 g/dL (ref 32.0–36.0)
MCV: 84.2 fL (ref 80.0–100.0)
MONO ABS: 0.4 10*3/uL (ref 0.2–0.9)
Monocytes Relative: 5 %
NEUTROS ABS: 5.7 10*3/uL (ref 1.4–6.5)
Neutrophils Relative %: 81 %
PLATELETS: 233 10*3/uL (ref 150–440)
RBC: 3.53 MIL/uL — ABNORMAL LOW (ref 3.80–5.20)
RDW: 14.2 % (ref 11.5–14.5)
WBC: 7.1 10*3/uL (ref 3.6–11.0)

## 2016-10-19 LAB — BASIC METABOLIC PANEL
ANION GAP: 4 — AB (ref 5–15)
BUN: 13 mg/dL (ref 6–20)
CHLORIDE: 108 mmol/L (ref 101–111)
CO2: 26 mmol/L (ref 22–32)
Calcium: 7.8 mg/dL — ABNORMAL LOW (ref 8.9–10.3)
Creatinine, Ser: 0.34 mg/dL — ABNORMAL LOW (ref 0.44–1.00)
GFR calc Af Amer: >60 mL/min (ref >60)
GFR calc non Af Amer: >60 mL/min (ref >60)
GLUCOSE: 207 mg/dL — AB (ref 65–99)
POTASSIUM: 3.8 mmol/L (ref 3.5–5.1)
Sodium: 138 mmol/L (ref 135–145)

## 2016-10-19 LAB — URINE CULTURE

## 2016-10-19 LAB — CULTURE, BLOOD (ROUTINE X 2)

## 2016-10-19 LAB — GLUCOSE, CAPILLARY
Glucose-Capillary: 212 mg/dL — ABNORMAL HIGH (ref 65–99)
Glucose-Capillary: 223 mg/dL — ABNORMAL HIGH (ref 65–99)

## 2016-10-19 MED ORDER — CIPROFLOXACIN HCL 500 MG PO TABS: 500.0000 mg | ORAL_TABLET | Freq: Two times a day (BID) | ORAL | 0 refills | Status: DC

## 2016-10-19 NOTE — Progress Notes (Signed)
Inpatient Diabetes Program Recommendations  AACE/ADA: New Consensus Statement on Inpatient Glycemic Control (2015)  Target Ranges:  Prepandial:   less than 140 mg/dL      Peak postprandial:   less than 180 mg/dL (1-2 hours)      Critically ill patients:  140 - 180 mg/dL   Lab Results  Component Value Date   GLUCAP 212 (H) 10/19/2016   HGBA1C 13.9 (H) 02/14/2016    Review of Glycemic Control  Results for [name], [name] (MRN [id number]) as of 10/19/2016 08:24  Ref. Range 10/18/2016 07:43 10/18/2016 12:19 10/18/2016 17:03 10/18/2016 21:33 10/19/2016 07:38  Glucose-Capillary Latest Ref Range: 65 - 99 mg/dL 160 (H) 180 (H) 163 (H) 235 (H) 212 (H)    Diabetes history: Type 2 Outpatient Diabetes medications: NPH 8 units bid, Glipizide 10mg  bid, Metformin 2000mg  qam  Current orders for Inpatient glycemic control: Levemir 8 units bid, Novolog 0-9 units tid, Novolog 0-5 units qhs, Glucotrol 10mg  bid  Inpatient Diabetes Program Recommendations:    Per ADA recommendations "consider performing an A1C on all patients with diabetes or hyperglycemia admitted to the hospital if not performed in the prior 3 months".  Consider adding Novolog 2 units tid- continue Novolog correction as ordered. (post prandial blood sugars still elevated despite correction.  Ordering mealtime insulin will ensure the patient gets bolus insulin at each meal)  Gentry Fitz, RN, BA, MHA, CDE Diabetes Coordinator Inpatient Diabetes Program  [phone] (Team Pager) [phone] ([place]) 10/19/2016 8:28 AM

## 2016-10-19 NOTE — Discharge Instructions (Signed)
Resume diet and activity as before ° ° °

## 2016-10-20 LAB — CULTURE, BLOOD (ROUTINE X 2)

## 2016-10-21 NOTE — Discharge Summary (Signed)
[facility] [place] NAME: Ruth Walters    MR#:  [id number]  DATE OF BIRTH:  [birth year]  DATE OF ADMISSION:  10/16/2016 ADMITTING PHYSICIAN: Loletha Grayer, MD  DATE OF DISCHARGE: 10/19/2016  3:02 PM  PRIMARY CARE PHYSICIAN: No primary care provider on file.   ADMISSION DIAGNOSIS:  Acute cystitis with hematuria [N30.01] Sepsis, due to unspecified organism ([place]) [A41.9]  DISCHARGE DIAGNOSIS:  Active Problems:   Sepsis ([place])   SECONDARY DIAGNOSIS:   Past Medical History:  Diagnosis Date  . Cancer ([place])   . Diabetes mellitus without complication ([place])   . Low blood pressure      ADMITTING HISTORY  Ruth Walters  is a 50 y.o. female with a known history of Multiple myeloma presents with fever. Her temperature is been going up and down. She felt like she is going to pass out. She's been having pulsing pain all over her body and cramps in her feet. She's having lower abdominal pain and burning in her left back and with urination. She's had some nausea vomiting and she is very thirsty. In the ER she was found to have a temperature of 102.5, she is tachycardic and has leukocytosis. Urinalysis was also positive.History and physical helped with a translator provided by the hospital.  HOSPITAL COURSE:   * E coli bacteremia secondary to UTI with sepsis Patient was initially treated with IV ceftriaxone but later transitioned to meropenem due to immunocompromised state and concern for ESBL. Renal ultrasound showed no obstruction or hydronephrosis Patient had pansensitive Escherichia coli on her cultures. Change to oral ciprofloxacin which will be continued for 12 more days after discharge to finish 14 day course.  * Insulin-dependent diabetes mellitus Uncontrolled likely due to acute infection. Continue glipizide, Levemir, sliding scale insulin.  Well-controlled by day of discharge.  * Multiple myeloma Patient follows up at [place] center.  Stable for discharge home to follow-up with her primary care physician and the cancer center. Advised to return in case she has any fever or abdominal pain.  CONSULTS OBTAINED:    DRUG ALLERGIES:  No Known Allergies  DISCHARGE MEDICATIONS:   Discharge Medication List as of 10/19/2016 11:53 AM    CONTINUE these medications which have CHANGED   Details  ciprofloxacin (CIPRO) 500 MG tablet Take 1 tablet (500 mg total) by mouth 2 (two) times daily., Starting Tue 10/19/2016, Print      CONTINUE these medications which have NOT CHANGED   Details  aspirin EC 81 MG tablet Take 81 mg by mouth daily., Starting 11/13/2014, Until Discontinued, Historical Med    dexamethasone (DECADRON) 4 MG tablet Take 20 mg by mouth every Wednesday., Starting 01/07/2016, Until Discontinued, Historical Med    glipiZIDE (GLUCOTROL) 10 MG tablet Take 10 mg by mouth 2 (two) times daily before a meal., Starting 06/18/2015, Until Discontinued, Historical Med    insulin NPH Human (HUMULIN N) 100 UNIT/ML injection Inject 0.08 mLs (8 Units total) into the skin 2 (two) times daily before a meal., Starting 02/17/2016, Until Discontinued, Print    metFORMIN (GLUMETZA) 1000 MG (MOD) 24 hr tablet Take 2,000 mg by mouth daily before breakfast., Starting 10/01/2015, Until Thu 09/30/16, Historical Med    oxyCODONE (ROXICODONE) 5 MG immediate release tablet Take 5 mg by mouth every 8 (eight) hours as needed. For pain., Starting 02/04/2016, Until Thu 02/03/17, Historical Med    pomalidomide (POMALYST) 3 MG capsule Take 3 mg by mouth daily. Take for 21  days. Rest for 7 days. Restart as soon as you get., Starting 02/13/2016, Until Discontinued, Historical Med        Today   VITAL SIGNS:  Blood pressure (!) 95/51, pulse 91, temperature 98.8 F (37.1 C), temperature source Oral, resp. rate 20, height 4' 9"  (1.448 m), weight 59.4 kg (131 lb), SpO2 92 %.  I/O:  No intake or output data in the 24 hours ending  10/21/16 1557  PHYSICAL EXAMINATION:  Physical Exam  GENERAL:  50 y.o.-year-old patient lying in the bed with no acute distress.  LUNGS: Normal breath sounds bilaterally, no wheezing, rales,rhonchi or crepitation. No use of accessory muscles of respiration.  CARDIOVASCULAR: S1, S2 normal. No murmurs, rubs, or gallops.  ABDOMEN: Soft, non-tender, non-distended. Bowel sounds present. No organomegaly or mass.  NEUROLOGIC: Moves all 4 extremities. PSYCHIATRIC: The patient is alert and oriented x 3.  SKIN: No obvious rash, lesion, or ulcer.   DATA REVIEW:   CBC  Recent Labs Lab 10/19/16 0506  WBC 7.1  HGB 10.1*  HCT 29.7*  PLT 233    Chemistries   Recent Labs Lab 10/16/16 1950  10/19/16 0506  NA 133*  < > 138  K 4.1  < > 3.8  CL 98*  < > 108  CO2 25  < > 26  GLUCOSE 472*  < > 207*  BUN 19  < > 13  CREATININE 0.60  < > 0.34*  CALCIUM 8.7*  < > 7.8*  AST 13*  --   --   ALT 11*  --   --   ALKPHOS 167*  --   --   BILITOT 0.6  --   --   < > = values in this interval not displayed.  Cardiac Enzymes  Recent Labs Lab 10/17/16 1901  TROPONINI <0.03    Microbiology Results  Results for orders placed or performed during the hospital encounter of 10/16/16  Culture, blood (Routine x 2)     Status: Abnormal   Collection Time: 10/16/16  7:50 PM  Result Value Ref Range Status   Specimen Description BLOOD LEFT ASSIST CONTROL  Final   Special Requests   Final    BOTTLES DRAWN AEROBIC AND ANAEROBIC 12CCAERO,11CCANA   Culture  Setup Time   Final    GRAM NEGATIVE RODS IN BOTH AEROBIC AND ANAEROBIC BOTTLES CRITICAL RESULT CALLED TO, READ BACK BY AND VERIFIED WITH: HOLLY GILLIAM ON 10/17/16 AT 1130 [facility] Performed at [facility] (A)  Final   Report Status 10/19/2016 FINAL  Final   Organism ID, Bacteria ESCHERICHIA COLI  Final      Susceptibility   Escherichia coli - MIC*    AMPICILLIN >=32 RESISTANT Resistant     CEFAZOLIN <=4 SENSITIVE  Sensitive     CEFEPIME <=1 SENSITIVE Sensitive     CEFTAZIDIME <=1 SENSITIVE Sensitive     CEFTRIAXONE <=1 SENSITIVE Sensitive     CIPROFLOXACIN <=0.25 SENSITIVE Sensitive     GENTAMICIN <=1 SENSITIVE Sensitive     IMIPENEM <=0.25 SENSITIVE Sensitive     TRIMETH/SULFA >=320 RESISTANT Resistant     AMPICILLIN/SULBACTAM 16 INTERMEDIATE Intermediate     PIP/TAZO <=4 SENSITIVE Sensitive     Extended ESBL NEGATIVE Sensitive     * ESCHERICHIA COLI  Culture, blood (Routine x 2)     Status: Abnormal   Collection Time: 10/16/16  7:50 PM  Result Value Ref Range Status   Specimen Description BLOOD RIGHT ASSIST CONTROL  Final   Special Requests BOTTLES DRAWN AEROBIC AND ANAEROBIC [place]  Final   Culture  Setup Time   Final    GRAM NEGATIVE RODS IN BOTH AEROBIC AND ANAEROBIC BOTTLES CRITICAL VALUE NOTED.  VALUE IS CONSISTENT WITH PREVIOUSLY REPORTED AND CALLED VALUE. CONFIRMED BY [facility]    Culture (A)  Final    ESCHERICHIA COLI SUSCEPTIBILITIES PERFORMED ON PREVIOUS CULTURE WITHIN THE LAST 5 DAYS. Performed at [facility]    Report Status 10/20/2016 FINAL  Final  Urine culture     Status: Abnormal   Collection Time: 10/16/16  7:50 PM  Result Value Ref Range Status   Specimen Description URINE, RANDOM  Final   Special Requests NONE  Final   Culture >=100,000 COLONIES/mL ESCHERICHIA COLI (A)  Final   Report Status 10/19/2016 FINAL  Final   Organism ID, Bacteria ESCHERICHIA COLI (A)  Final      Susceptibility   Escherichia coli - MIC*    AMPICILLIN >=32 RESISTANT Resistant     CEFAZOLIN <=4 SENSITIVE Sensitive     CEFTRIAXONE <=1 SENSITIVE Sensitive     CIPROFLOXACIN <=0.25 SENSITIVE Sensitive     GENTAMICIN <=1 SENSITIVE Sensitive     IMIPENEM <=0.25 SENSITIVE Sensitive     NITROFURANTOIN <=16 SENSITIVE Sensitive     TRIMETH/SULFA >=320 RESISTANT Resistant     AMPICILLIN/SULBACTAM 16 INTERMEDIATE Intermediate     PIP/TAZO <=4 SENSITIVE Sensitive     Extended ESBL  NEGATIVE Sensitive     * >=100,000 COLONIES/mL ESCHERICHIA COLI  Blood Culture ID Panel (Reflexed)     Status: Abnormal   Collection Time: 10/16/16  7:50 PM  Result Value Ref Range Status   Enterococcus species NOT DETECTED NOT DETECTED Final   Listeria monocytogenes NOT DETECTED NOT DETECTED Final   Staphylococcus species NOT DETECTED NOT DETECTED Final   Staphylococcus aureus NOT DETECTED NOT DETECTED Final   Streptococcus species NOT DETECTED NOT DETECTED Final   Streptococcus agalactiae NOT DETECTED NOT DETECTED Final   Streptococcus pneumoniae NOT DETECTED NOT DETECTED Final   Streptococcus pyogenes NOT DETECTED NOT DETECTED Final   Acinetobacter baumannii NOT DETECTED NOT DETECTED Final   Enterobacteriaceae species DETECTED (A) NOT DETECTED Final    Comment: CRITICAL RESULT CALLED TO, READ BACK BY AND VERIFIED WITH: HOLLY GILLIAM ON 10/17/16 AT 1130 [place]    Enterobacter cloacae complex NOT DETECTED NOT DETECTED Final   Escherichia coli DETECTED (A) NOT DETECTED Final    Comment: CRITICAL RESULT CALLED TO, READ BACK BY AND VERIFIED WITH: HOLLY GILLIAM ON 10/17/16 AT 1130 [facility]    Klebsiella oxytoca NOT DETECTED NOT DETECTED Final   Klebsiella pneumoniae NOT DETECTED NOT DETECTED Final   Proteus species NOT DETECTED NOT DETECTED Final   Serratia marcescens NOT DETECTED NOT DETECTED Final   Carbapenem resistance NOT DETECTED NOT DETECTED Final   Haemophilus influenzae NOT DETECTED NOT DETECTED Final   Neisseria meningitidis NOT DETECTED NOT DETECTED Final   Pseudomonas aeruginosa NOT DETECTED NOT DETECTED Final   Candida albicans NOT DETECTED NOT DETECTED Final   Candida glabrata NOT DETECTED NOT DETECTED Final   Candida krusei NOT DETECTED NOT DETECTED Final   Candida parapsilosis NOT DETECTED NOT DETECTED Final   Candida tropicalis NOT DETECTED NOT DETECTED Final    RADIOLOGY:  No results found.  Follow up with PCP in 1 week.  Management plans discussed with the patient,  family and they are in agreement.  CODE STATUS:  Code Status History    Date Active Date  Inactive Code Status Order ID Comments User Context   10/16/2016  9:02 PM 10/19/2016  6:07 PM Full Code [id number]  Loletha Grayer, MD ED   02/14/2016 10:47 PM 02/17/2016  7:07 PM Full Code [id number]  Lance Coon, MD Inpatient      TOTAL TIME TAKING CARE OF THIS PATIENT ON DAY OF DISCHARGE: more than 30 minutes.   Hillary Bow R M.D on 10/21/2016 at 3:57 PM  Between 7am to 6pm - Pager - [phone]  After 6pm go to [email or web address] - password [email or web address] [place] [facility]  Office  [phone]  CC: Primary care physician; No primary care provider on file.  Note: This dictation was prepared with Dragon dictation along with smaller phrase technology. Any transcriptional errors that result from this process are unintentional.

## 2017-06-12 ENCOUNTER — Encounter: Payer: Self-pay | Admitting: Emergency Medicine

## 2017-06-12 ENCOUNTER — Emergency Department: Payer: Self-pay

## 2017-06-12 ENCOUNTER — Observation Stay
Admission: EM | Admit: 2017-06-12 | Discharge: 2017-06-15 | Disposition: A | Discharge status: Home / Self Care | Payer: Self-pay | Attending: Internal Medicine | Admitting: Internal Medicine

## 2017-06-12 DIAGNOSIS — E876 Hypokalemia: Secondary | ICD-10-CM | POA: Insufficient documentation

## 2017-06-12 DIAGNOSIS — R079 Chest pain, unspecified: Secondary | ICD-10-CM | POA: Diagnosis present

## 2017-06-12 DIAGNOSIS — E1143 Type 2 diabetes mellitus with diabetic autonomic (poly)neuropathy: Principal | ICD-10-CM | POA: Insufficient documentation

## 2017-06-12 DIAGNOSIS — E119 Type 2 diabetes mellitus without complications: Secondary | ICD-10-CM

## 2017-06-12 DIAGNOSIS — Z7982 Long term (current) use of aspirin: Secondary | ICD-10-CM | POA: Insufficient documentation

## 2017-06-12 DIAGNOSIS — R112 Nausea with vomiting, unspecified: Secondary | ICD-10-CM | POA: Diagnosis present

## 2017-06-12 DIAGNOSIS — E860 Dehydration: Secondary | ICD-10-CM

## 2017-06-12 DIAGNOSIS — R739 Hyperglycemia, unspecified: Secondary | ICD-10-CM

## 2017-06-12 DIAGNOSIS — C9000 Multiple myeloma not having achieved remission: Secondary | ICD-10-CM | POA: Diagnosis present

## 2017-06-12 DIAGNOSIS — E1165 Type 2 diabetes mellitus with hyperglycemia: Secondary | ICD-10-CM | POA: Insufficient documentation

## 2017-06-12 DIAGNOSIS — Z79899 Other long term (current) drug therapy: Secondary | ICD-10-CM | POA: Insufficient documentation

## 2017-06-12 DIAGNOSIS — R000 Tachycardia, unspecified: Secondary | ICD-10-CM

## 2017-06-12 DIAGNOSIS — Z7952 Long term (current) use of systemic steroids: Secondary | ICD-10-CM | POA: Insufficient documentation

## 2017-06-12 DIAGNOSIS — K3184 Gastroparesis: Secondary | ICD-10-CM | POA: Insufficient documentation

## 2017-06-12 LAB — HEPATIC FUNCTION PANEL
ALK PHOS: 152 U/L — AB (ref 38–126)
ALT: 19 U/L (ref 14–54)
AST: 19 U/L (ref 15–41)
Albumin: 4.3 g/dL (ref 3.5–5.0)
Bilirubin, Direct: <0.1 mg/dL — ABNORMAL LOW (ref 0.1–0.5)
TOTAL PROTEIN: 7.6 g/dL (ref 6.5–8.1)
Total Bilirubin: 1 mg/dL (ref 0.3–1.2)

## 2017-06-12 LAB — CBC
HEMATOCRIT: 38.8 % (ref 35.0–47.0)
Hemoglobin: 13 g/dL (ref 12.0–16.0)
MCH: 28.2 pg (ref 26.0–34.0)
MCHC: 33.5 g/dL (ref 32.0–36.0)
MCV: 84 fL (ref 80.0–100.0)
Platelets: 326 10*3/uL (ref 150–440)
RBC: 4.61 MIL/uL (ref 3.80–5.20)
RDW: 14.3 % (ref 11.5–14.5)
WBC: 14 10*3/uL — ABNORMAL HIGH (ref 3.6–11.0)

## 2017-06-12 LAB — URINALYSIS, COMPLETE (UACMP) WITH MICROSCOPIC
Bilirubin Urine: NEGATIVE
KETONES UR: 80 mg/dL — AB
Leukocytes, UA: NEGATIVE
Nitrite: NEGATIVE
PH: 6 (ref 5.0–8.0)
Protein, ur: 100 mg/dL — AB
SPECIFIC GRAVITY, URINE: 1.031 — AB (ref 1.005–1.030)

## 2017-06-12 LAB — BASIC METABOLIC PANEL
Anion gap: 17 — ABNORMAL HIGH (ref 5–15)
Anion gap: 9 (ref 5–15)
BUN: 16 mg/dL (ref 6–20)
BUN: 17 mg/dL (ref 6–20)
CHLORIDE: 101 mmol/L (ref 101–111)
CHLORIDE: 108 mmol/L (ref 101–111)
CO2: 24 mmol/L (ref 22–32)
CO2: 27 mmol/L (ref 22–32)
Calcium: 9.7 mg/dL (ref 8.9–10.3)
Calcium: 8.7 mg/dL — ABNORMAL LOW (ref 8.9–10.3)
Creatinine, Ser: 0.46 mg/dL (ref 0.44–1.00)
Creatinine, Ser: 0.48 mg/dL (ref 0.44–1.00)
GFR calc Af Amer: >60 mL/min (ref >60)
GFR calc non Af Amer: >60 mL/min (ref >60)
GLUCOSE: 410 mg/dL — AB (ref 65–99)
Glucose, Bld: 251 mg/dL — ABNORMAL HIGH (ref 65–99)
POTASSIUM: 3.7 mmol/L (ref 3.5–5.1)
Potassium: 3.6 mmol/L (ref 3.5–5.1)
SODIUM: 144 mmol/L (ref 135–145)
Sodium: 142 mmol/L (ref 135–145)

## 2017-06-12 LAB — GLUCOSE, CAPILLARY
GLUCOSE-CAPILLARY: 201 mg/dL — AB (ref 65–99)
Glucose-Capillary: 377 mg/dL — ABNORMAL HIGH (ref 65–99)
Glucose-Capillary: 350 mg/dL — ABNORMAL HIGH (ref 65–99)
Glucose-Capillary: 254 mg/dL — ABNORMAL HIGH (ref 65–99)

## 2017-06-12 LAB — LIPASE, BLOOD: Lipase: 17 U/L (ref 11–51)

## 2017-06-12 LAB — TROPONIN I
Troponin I: <0.03 ng/mL (ref <0.03)
Troponin I: <0.03 ng/mL (ref <0.03)

## 2017-06-12 LAB — PREGNANCY, URINE: PREG TEST UR: NEGATIVE

## 2017-06-12 IMAGING — DX DG ABDOMEN 1V
2 series · 2 of 2 positions shown · non-contrast
Comparison: None.

CLINICAL DATA: Vomiting.

EXAM:
ABDOMEN - 1 VIEW

[abdomen kub (1 of 2)]
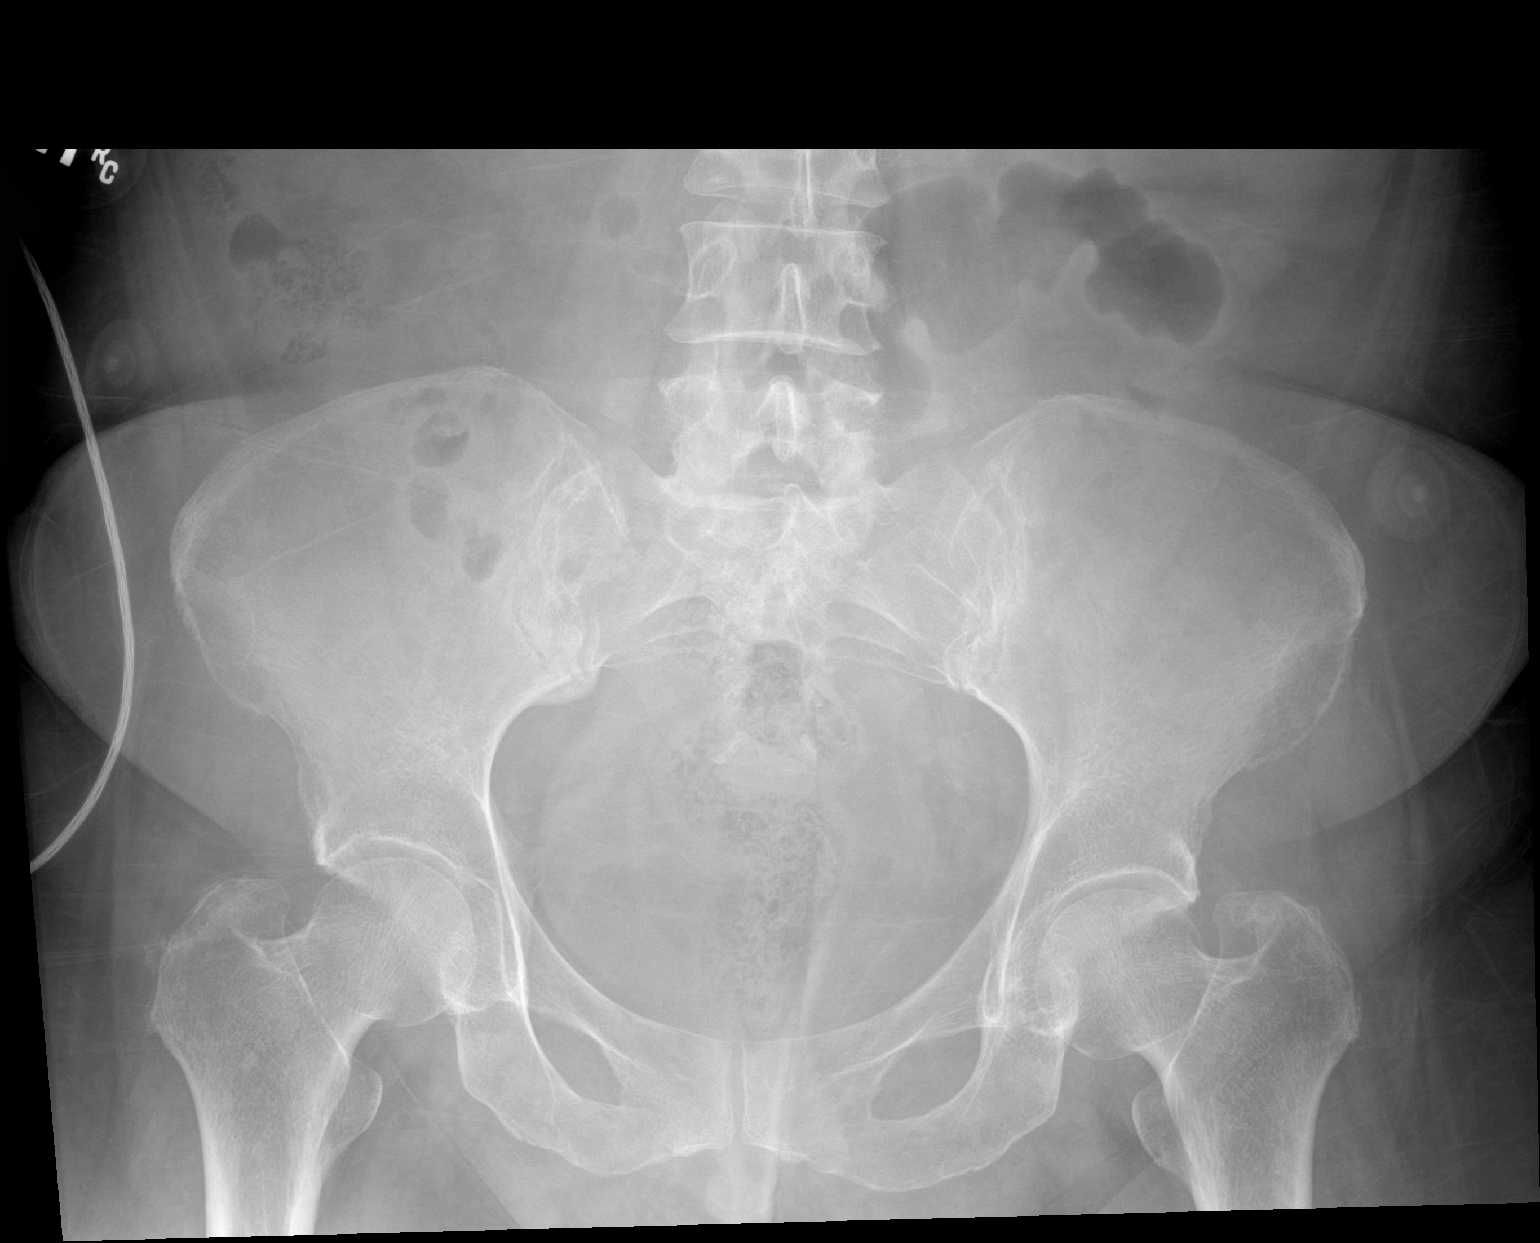

[abdomen kub (2 of 2)]
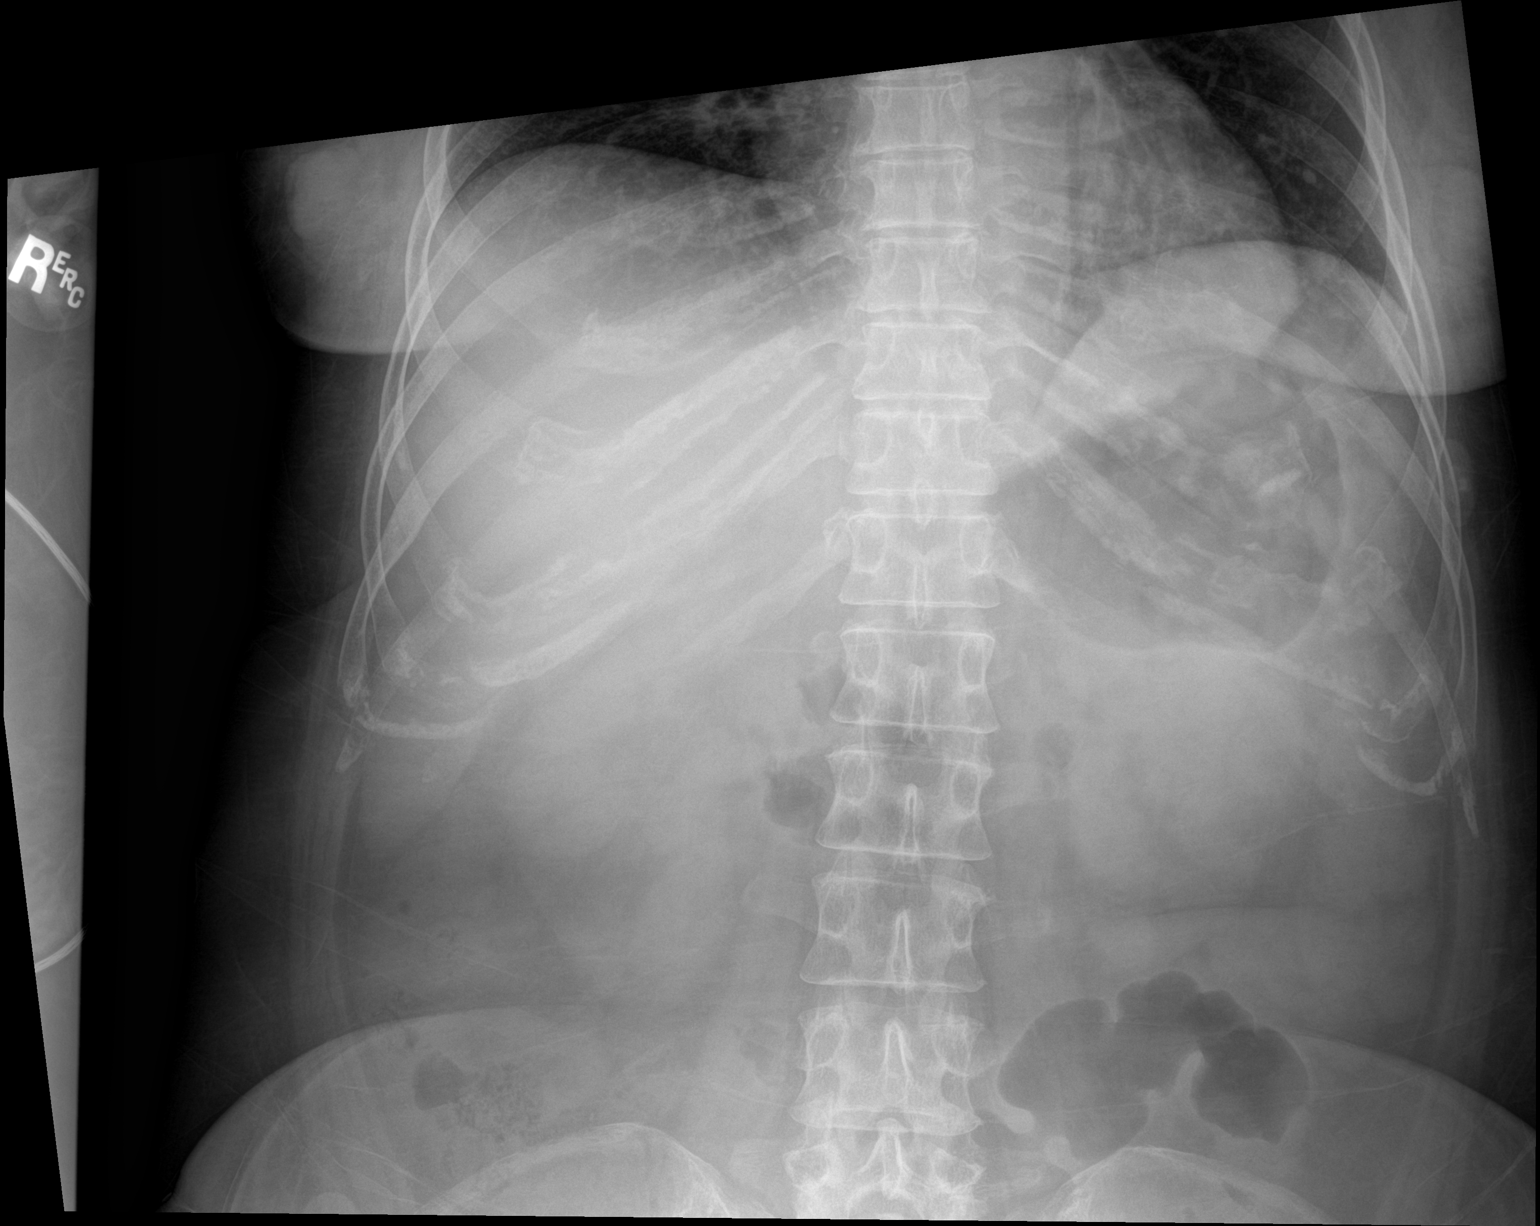

[2 of 2 positions shown; findings below may reference images not displayed]

FINDINGS: The bowel gas pattern is normal. No radio-opaque calculi or other
significant radiographic abnormality are seen.
IMPRESSION: Negative.

## 2017-06-12 IMAGING — CR DG CHEST 2V
1 series · 2 of 2 positions shown · non-contrast
Comparison: Chest x-ray dated 10/16/2016.

CLINICAL DATA: Chest pain last night, worse this morning, radiating
from center of chest in neck. History of multiple myeloma on chemo.
Nausea and shortness of breath.

EXAM:
CHEST  2 VIEW

[Series 1: dg chest 2 view · 0.14mm/px · 2 of 2 slices shown]
[im 1/2]
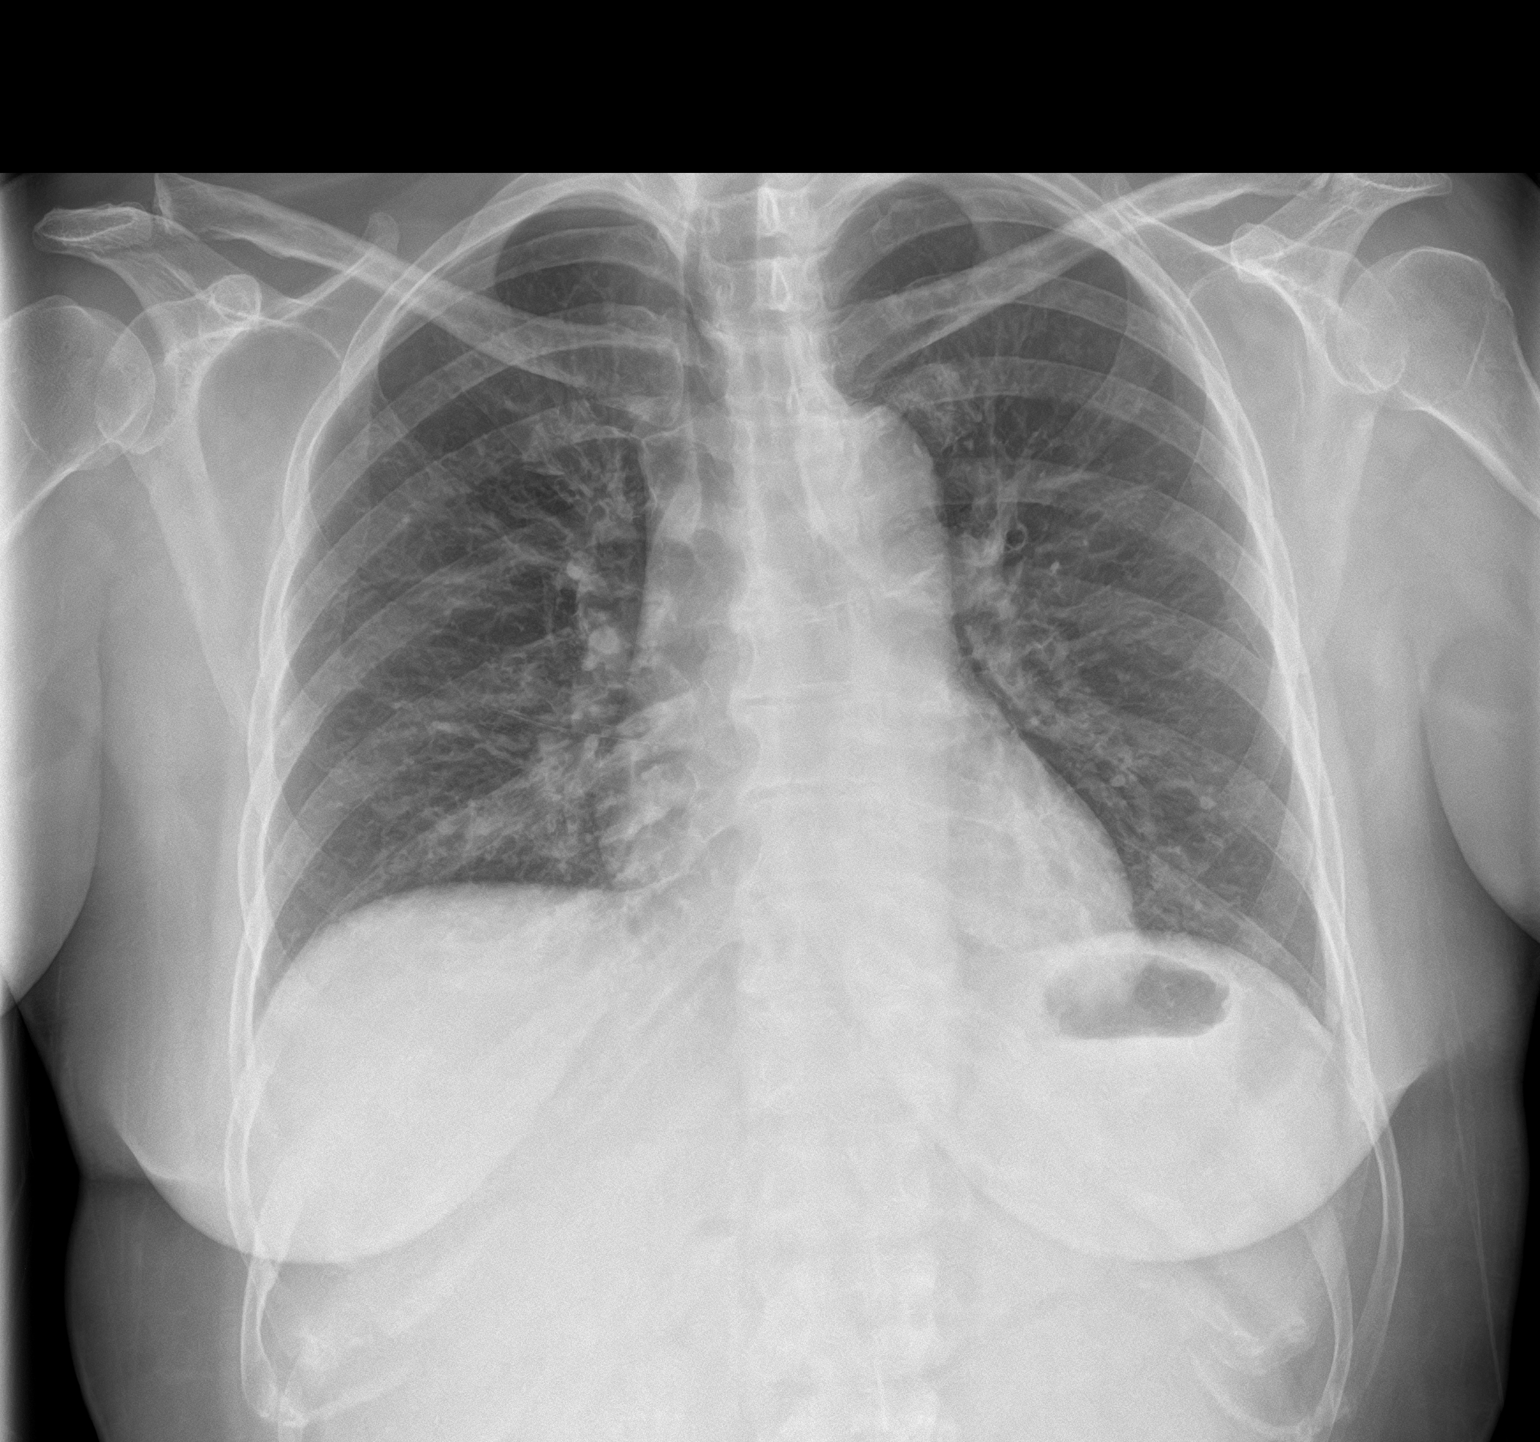
[im 2/2]
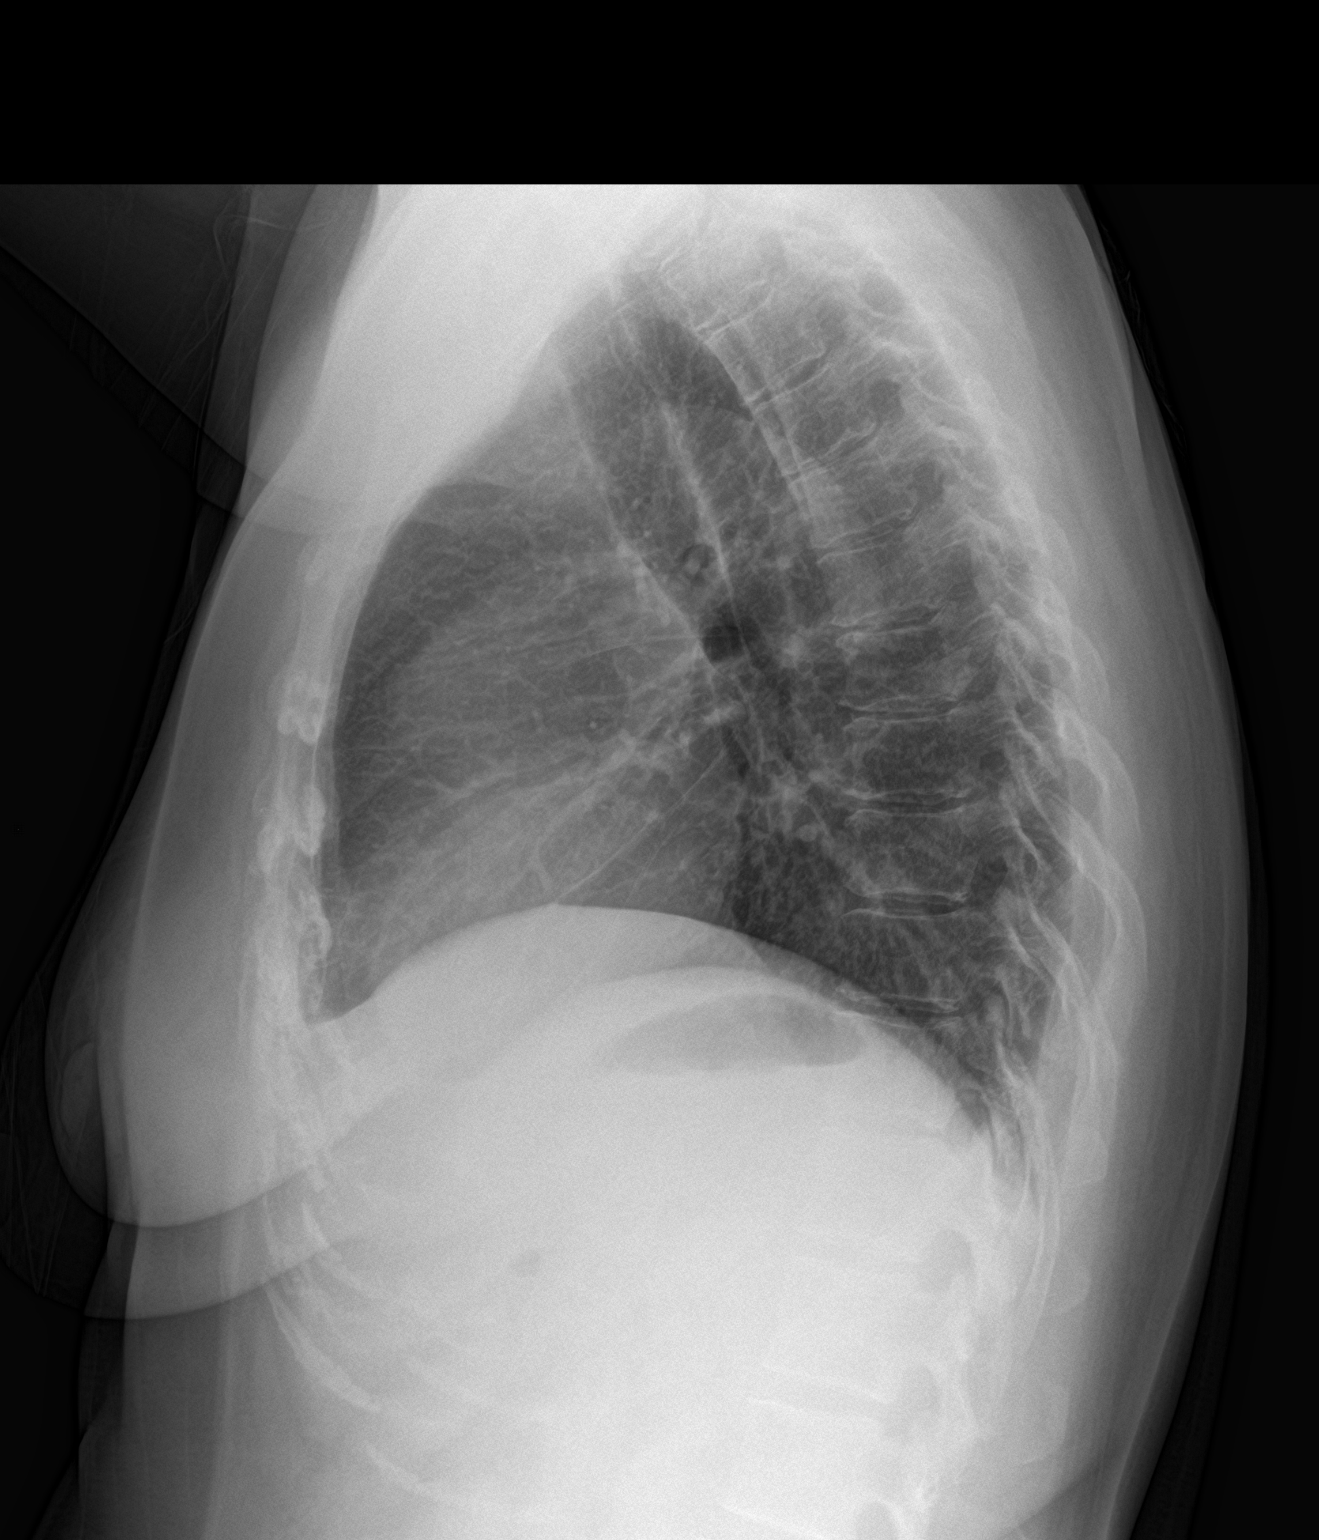

[2 of 2 positions shown; findings below may reference images not displayed]

FINDINGS: Heart size and mediastinal contours are stable. Lungs are clear. No
pleural effusion or pneumothorax seen. Osseous structures about the
chest are unremarkable.
IMPRESSION: No active cardiopulmonary disease. No evidence of pneumonia or
pulmonary edema.

## 2017-06-12 IMAGING — CT CT HEAD W/O CM
4 series · 16 of 47 positions shown, 18 images · non-contrast
Comparison: None.

CLINICAL DATA: Fall today, nausea and vomiting.

EXAM:
CT HEAD WITHOUT CONTRAST
TECHNIQUE: Contiguous axial images were obtained from the base of the skull
through the vertex without intravenous contrast.

[Series 2: head wo · axial · 0.40mm/px · z∈[+638,+743]mm · 7 of 29 slices shown, 9 images]
[im 4/29  brain]
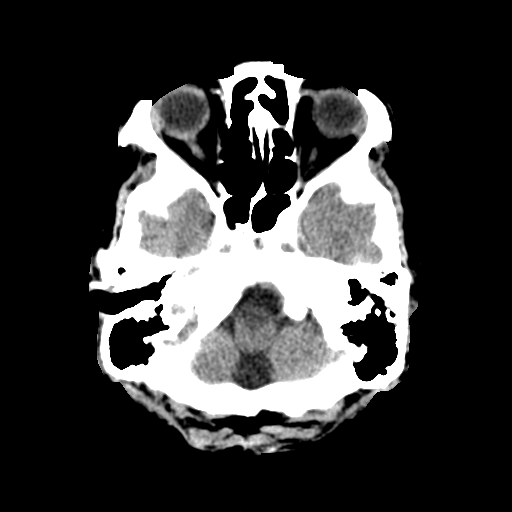
[im 4/29  bone]
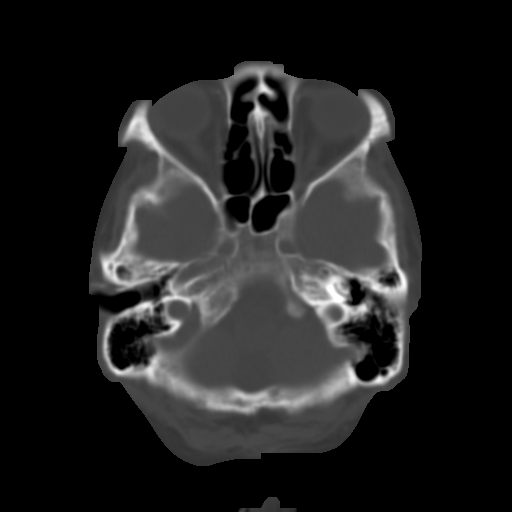
[im 8/29  brain]
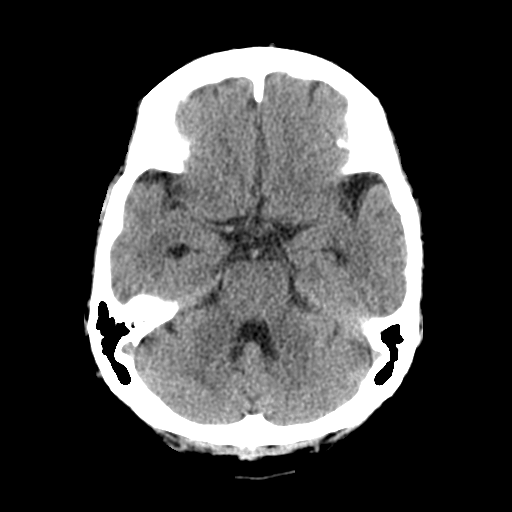
[im 11/29  brain]
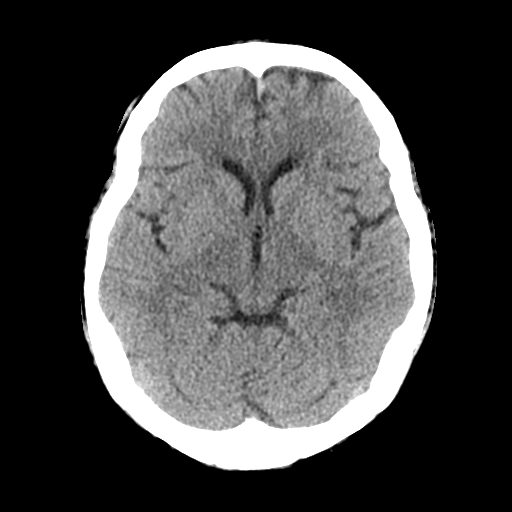
[im 15/29  brain]
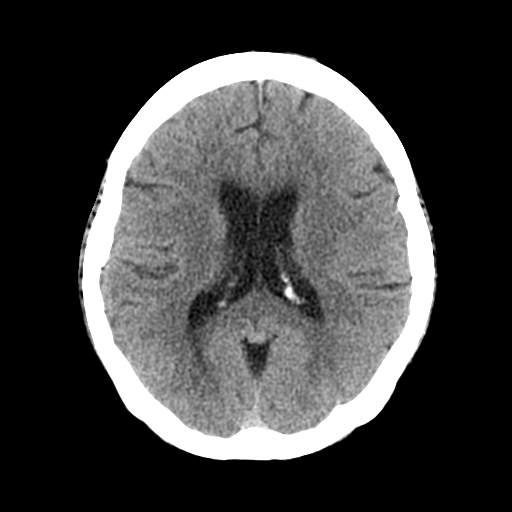
[im 18/29  brain]
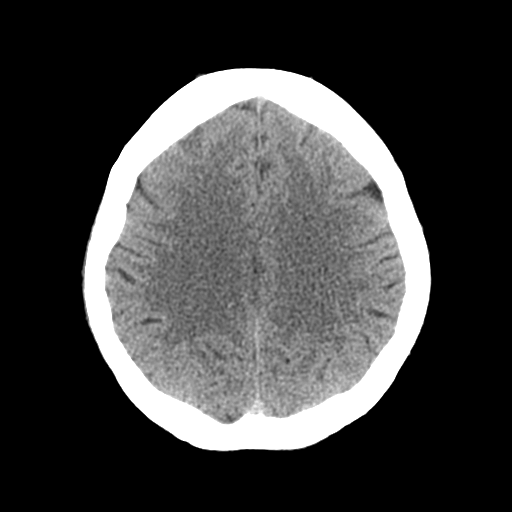
[im 18/29  bone]
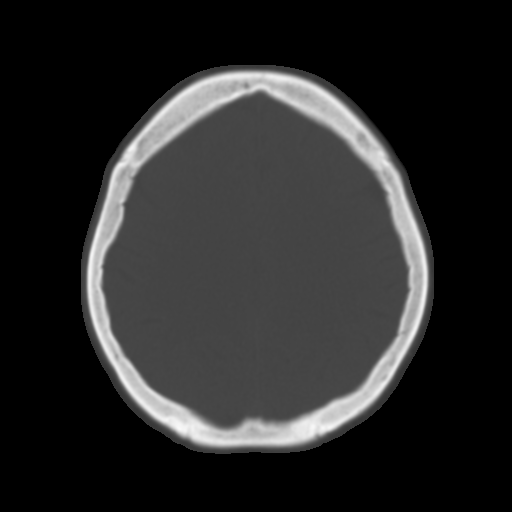
[im 22/29  brain]
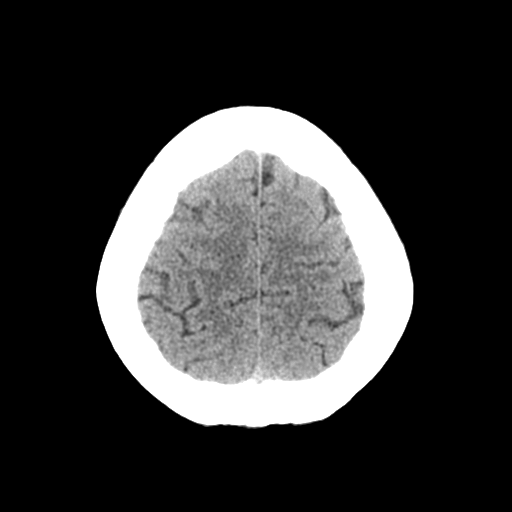
[im 25/29  brain]
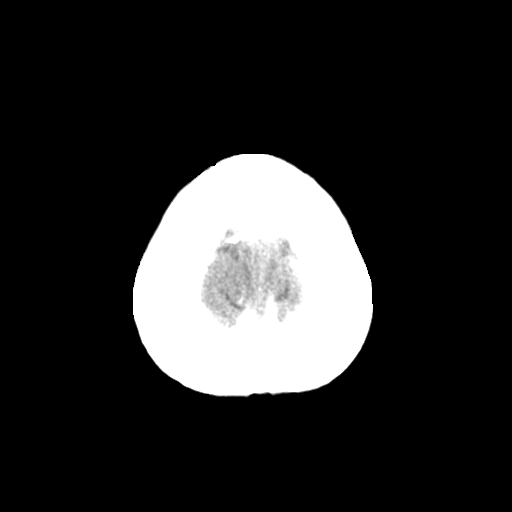

[Series 3: head bone · axial · 0.40mm/px · z∈[+637,+665]mm · 3 of 72 slices shown]
[im 8/72  bone]
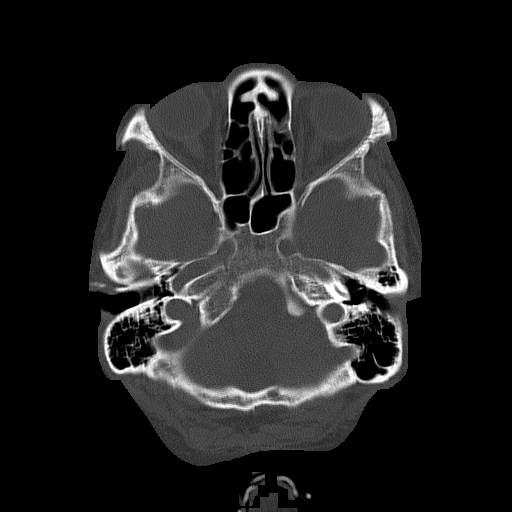
[im 15/72  bone]
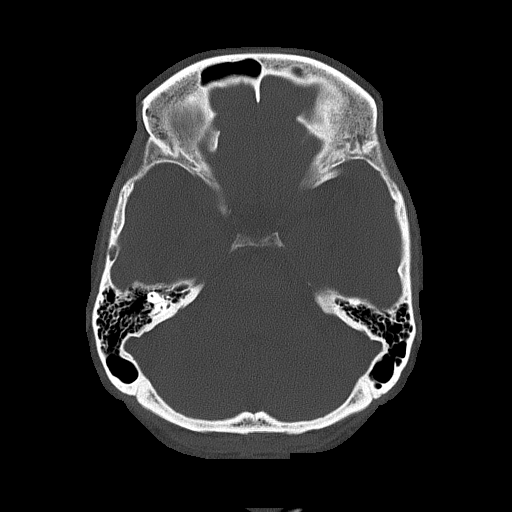
[im 22/72  bone]
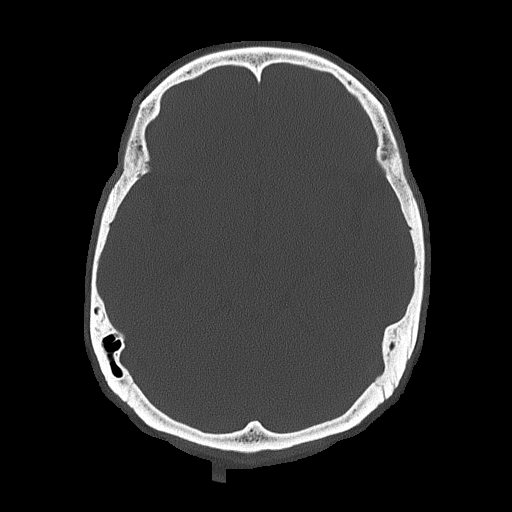

[Series 4: coronal soft tissue · coronal · 0.27mm/px · 3 of 59 slices shown]
[im 20/59  brain]
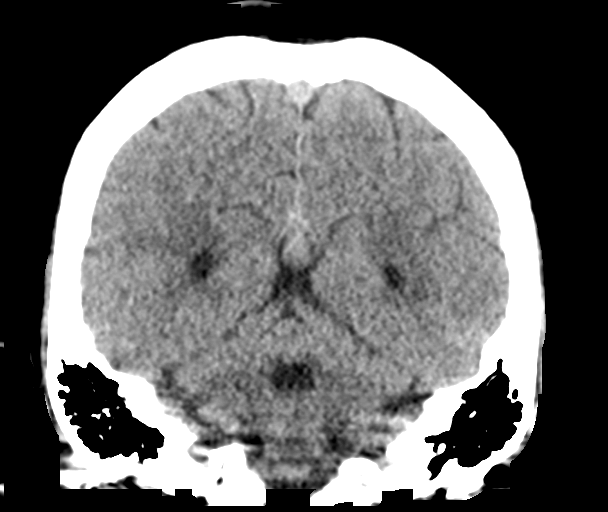
[im 26/59  brain]
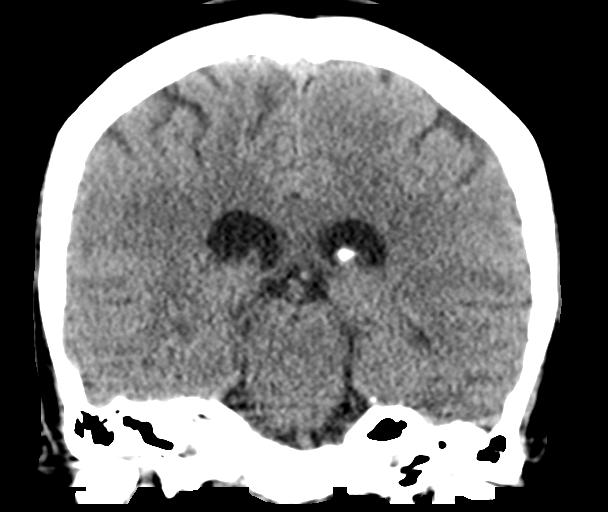
[im 33/59  brain]
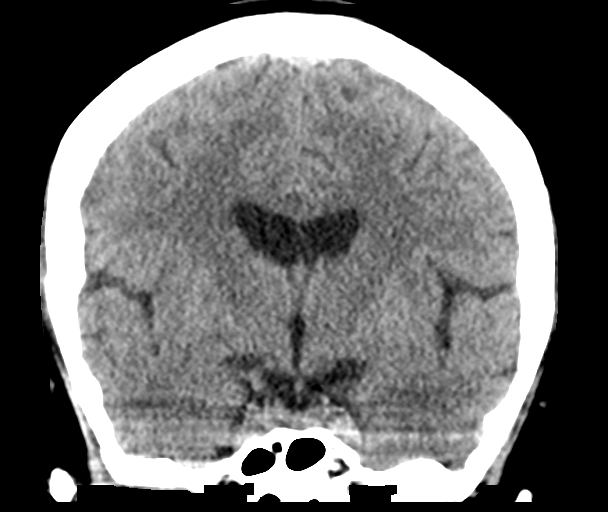

[Series 5: sagittal soft tissue · sagittal · 0.28mm/px · 3 of 50 slices shown]
[im 17/50  brain]
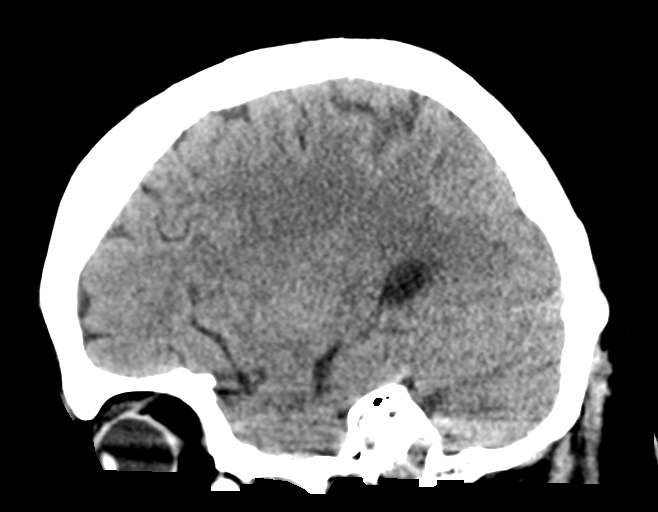
[im 25/50  brain]
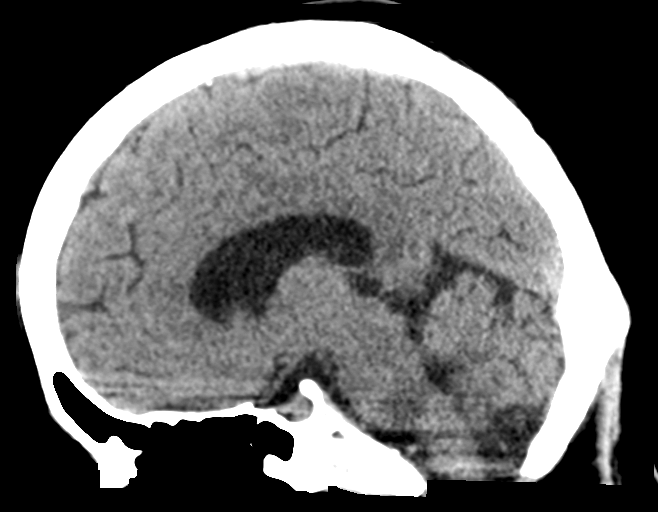
[im 33/50  brain]
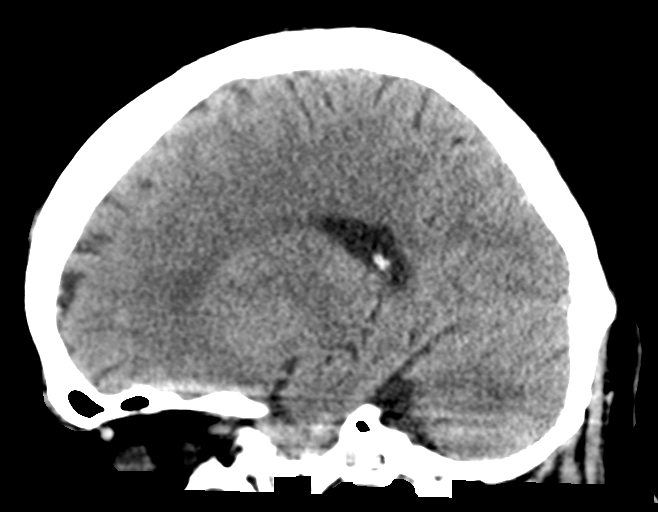

[16 of 47 positions shown; findings below may reference images not displayed]

FINDINGS: Brain: Ventricles are within normal limits in size and
configuration. All areas of the brain demonstrate normal gray-white
matter attenuation. There is no mass, hemorrhage, edema or other
evidence of acute parenchymal abnormality. No extra-axial
hemorrhage.

Vascular: No hyperdense vessel or unexpected calcification.

Skull: Scattered lucent foci within the skull, largest within the
upper left parietal bone measuring 1.1 cm greatest dimension.

Sinuses/Orbits: Mucosal thickening within the ethmoid air cells,
incompletely imaged. Orbital and periorbital soft tissues are
unremarkable.

Other: None.
IMPRESSION: 1. No acute intracranial abnormality. No intracranial mass,
hemorrhage or edema. No skull fracture.
2. Scattered lucent foci within the skull, compatible with patient's
history of multiple myeloma.

## 2017-06-12 MED ORDER — ONDANSETRON HCL 4 MG/2ML IJ SOLN: 4.0000 mg | Freq: Four times a day (QID) | INTRAMUSCULAR | Status: DC | PRN

## 2017-06-12 MED ORDER — SODIUM CHLORIDE 0.9 % IV BOLUS (SEPSIS)
1000.0000 mL | Freq: Once | INTRAVENOUS | Status: AC
  Administered 2017-06-12: 1000 mL via INTRAVENOUS

## 2017-06-12 MED ORDER — POMALIDOMIDE 3 MG PO CAPS: 3.0000 mg | ORAL_CAPSULE | Freq: Every day | ORAL | Status: DC

## 2017-06-12 MED ORDER — LORAZEPAM 2 MG/ML IJ SOLN: 0.0000 mg | Freq: Two times a day (BID) | INTRAMUSCULAR | Status: DC

## 2017-06-12 MED ORDER — METOCLOPRAMIDE HCL 5 MG/ML IJ SOLN
5.0000 mg | Freq: Three times a day (TID) | INTRAMUSCULAR | Status: DC
  Administered 2017-06-12 – 2017-06-15 (×8): 5 mg via INTRAVENOUS
  Filled 2017-06-12 (×8): qty 2

## 2017-06-12 MED ORDER — LORAZEPAM 2 MG/ML IJ SOLN: 0.0000 mg | Freq: Four times a day (QID) | INTRAMUSCULAR | Status: DC

## 2017-06-12 MED ORDER — ACETAMINOPHEN 650 MG RE SUPP: 650.0000 mg | Freq: Four times a day (QID) | RECTAL | Status: DC | PRN

## 2017-06-12 MED ORDER — INSULIN ASPART 100 UNIT/ML ~~LOC~~ SOLN
10.0000 [IU] | Freq: Once | SUBCUTANEOUS | Status: AC
  Administered 2017-06-12: 10 [IU] via INTRAVENOUS
  Filled 2017-06-12: qty 10

## 2017-06-12 MED ORDER — ONDANSETRON HCL 4 MG PO TABS: 4.0000 mg | ORAL_TABLET | Freq: Four times a day (QID) | ORAL | Status: DC | PRN

## 2017-06-12 MED ORDER — FOLIC ACID 1 MG PO TABS
1.0000 mg | ORAL_TABLET | Freq: Every day | ORAL | Status: DC
  Administered 2017-06-13 – 2017-06-15 (×3): 1 mg via ORAL
  Filled 2017-06-12 (×3): qty 1

## 2017-06-12 MED ORDER — LORAZEPAM 2 MG/ML IJ SOLN: 1.0000 mg | Freq: Four times a day (QID) | INTRAMUSCULAR | Status: DC | PRN

## 2017-06-12 MED ORDER — VITAMIN B-1 100 MG PO TABS
100.0000 mg | ORAL_TABLET | Freq: Every day | ORAL | Status: DC
  Administered 2017-06-13: 100 mg via ORAL
  Filled 2017-06-12: qty 1

## 2017-06-12 MED ORDER — ADULT MULTIVITAMIN W/MINERALS CH
1.0000 | ORAL_TABLET | Freq: Every day | ORAL | Status: DC
  Administered 2017-06-13 – 2017-06-15 (×3): 1 via ORAL
  Filled 2017-06-12 (×3): qty 1

## 2017-06-12 MED ORDER — ENOXAPARIN SODIUM 40 MG/0.4ML ~~LOC~~ SOLN
40.0000 mg | SUBCUTANEOUS | Status: DC
  Administered 2017-06-12 – 2017-06-14 (×3): 40 mg via SUBCUTANEOUS
  Filled 2017-06-12 (×3): qty 0.4

## 2017-06-12 MED ORDER — DEXTROSE-NACL 5-0.45 % IV SOLN: INTRAVENOUS | Status: DC

## 2017-06-12 MED ORDER — SODIUM CHLORIDE 0.9 % IV BOLUS (SEPSIS)
500.0000 mL | Freq: Once | INTRAVENOUS | Status: AC
  Administered 2017-06-12: 500 mL via INTRAVENOUS

## 2017-06-12 MED ORDER — ASPIRIN EC 81 MG PO TBEC
81.0000 mg | DELAYED_RELEASE_TABLET | Freq: Every day | ORAL | Status: DC
  Administered 2017-06-13 – 2017-06-15 (×3): 81 mg via ORAL
  Filled 2017-06-12 (×3): qty 1

## 2017-06-12 MED ORDER — ACETAMINOPHEN 325 MG PO TABS
650.0000 mg | ORAL_TABLET | Freq: Four times a day (QID) | ORAL | Status: DC | PRN
  Administered 2017-06-14: 650 mg via ORAL
  Filled 2017-06-12: qty 2

## 2017-06-12 MED ORDER — ONDANSETRON HCL 4 MG/2ML IJ SOLN
4.0000 mg | Freq: Once | INTRAMUSCULAR | Status: AC
  Administered 2017-06-12: 4 mg via INTRAVENOUS
  Filled 2017-06-12: qty 2

## 2017-06-12 MED ORDER — SODIUM CHLORIDE 0.9 % IV SOLN: INTRAVENOUS | Status: DC

## 2017-06-12 MED ORDER — LORAZEPAM 2 MG/ML IJ SOLN
0.0000 mg | Freq: Four times a day (QID) | INTRAMUSCULAR | Status: DC
Start: 2017-06-12 — End: 2017-06-12

## 2017-06-12 MED ORDER — INSULIN ASPART 100 UNIT/ML ~~LOC~~ SOLN
0.0000 [IU] | Freq: Three times a day (TID) | SUBCUTANEOUS | Status: DC
  Administered 2017-06-12: 5 [IU] via SUBCUTANEOUS
  Administered 2017-06-13: 2 [IU] via SUBCUTANEOUS
  Administered 2017-06-13 (×3): 3 [IU] via SUBCUTANEOUS
  Administered 2017-06-14: 2 [IU] via SUBCUTANEOUS
  Administered 2017-06-14: 5 [IU] via SUBCUTANEOUS
  Administered 2017-06-14: 2 [IU] via SUBCUTANEOUS
  Administered 2017-06-15: 1 [IU] via SUBCUTANEOUS
  Administered 2017-06-15: 5 [IU] via SUBCUTANEOUS
  Filled 2017-06-12 (×10): qty 1

## 2017-06-12 MED ORDER — SODIUM CHLORIDE 0.9 % IV SOLN
INTRAVENOUS | Status: DC
  Administered 2017-06-12: 22:00:00 via INTRAVENOUS

## 2017-06-12 MED ORDER — PROMETHAZINE HCL 25 MG/ML IJ SOLN
12.5000 mg | Freq: Once | INTRAMUSCULAR | Status: AC
  Administered 2017-06-12: 12.5 mg via INTRAVENOUS
  Filled 2017-06-12: qty 1

## 2017-06-12 MED ORDER — MORPHINE SULFATE (PF) 4 MG/ML IV SOLN
4.0000 mg | INTRAVENOUS | Status: DC | PRN
  Administered 2017-06-12: 4 mg via INTRAVENOUS
  Filled 2017-06-12 (×2): qty 1

## 2017-06-12 MED ORDER — THIAMINE HCL 100 MG/ML IJ SOLN: 100.0000 mg | Freq: Every day | INTRAMUSCULAR | Status: DC

## 2017-06-12 MED ORDER — LORAZEPAM 1 MG PO TABS: 1.0000 mg | ORAL_TABLET | Freq: Four times a day (QID) | ORAL | Status: DC | PRN

## 2017-06-12 NOTE — ED Notes (Signed)
CBG 201 

## 2017-06-12 NOTE — ED Notes (Signed)
CBG 350 

## 2017-06-12 NOTE — ED Provider Notes (Signed)
[facility] Emergency Department Provider Note    None    (approximate)  I have reviewed the triage vital signs and the nursing notes.   HISTORY  Chief Complaint Chest Pain    HPI MICHIKO LINEMAN is a 51 y.o. female with a history of multiple myeloma as well as insulin-dependent diabetes presents with chief complaint of chest pain nausea vomiting started last night. Patient was at a party last night and drank a bottle of wine. She fell twice onto her chest where it is hurting.. Did not hit her head.Since this morning is been having worsening pain going through to her back multiple episodes of vomiting. No blood in her vomit. She follows with oncology at [facility]. Denies any recent fevers. No abdominal pain.   Past Medical History:  Diagnosis Date  . Cancer ([place])    Multiple Myleoma  . Diabetes mellitus without complication ([place])   . Low blood pressure    Family History  Problem Relation Age of Onset  . Leukemia Sister   . Hyperlipidemia Mother    Past Surgical History:  Procedure Laterality Date  . ABDOMINAL HYSTERECTOMY     Patient Active Problem List   Diagnosis Date Noted  . Intractable nausea and vomiting 06/12/2017  . Chest pain 06/12/2017  . Sepsis ([place]) 02/14/2016  . HCAP (healthcare-associated pneumonia) 02/14/2016  . Multiple myeloma ([place]) 02/14/2016  . Type 2 diabetes mellitus ([place]) 02/14/2016      Prior to Admission medications   Medication Sig Start Date End Date Taking? Authorizing Provider  aspirin EC 81 MG tablet Take 81 mg by mouth daily. 11/13/14   [provider]  dexamethasone (DECADRON) 4 MG tablet Take 20 mg by mouth every Wednesday. 01/07/16   [provider]  glipiZIDE (GLUCOTROL) 10 MG tablet Take 10 mg by mouth 2 (two) times daily before a meal. 06/18/15   [provider]  insulin NPH Human (HUMULIN N) 100 UNIT/ML injection Inject 0.08 mLs (8 Units total) into the skin 2 (two) times daily  before a meal. 02/17/16   Sainani, Belia Heman, MD  metFORMIN (GLUMETZA) 1000 MG (MOD) 24 hr tablet Take 2,000 mg by mouth daily before breakfast. 10/01/15 09/30/16  [provider]  pomalidomide (POMALYST) 3 MG capsule Take 3 mg by mouth daily. Take for 21 days. Rest for 7 days. Restart as soon as you get. 02/13/16   [provider]    Allergies Patient has no known allergies.    Social History Social History  Substance Use Topics  . Smoking status: Never Smoker  . Smokeless tobacco: Never Used  . Alcohol use No    Review of Systems Patient denies headaches, rhinorrhea, blurry vision, numbness, shortness of breath, chest pain, edema, cough, abdominal pain, nausea, vomiting, diarrhea, dysuria, fevers, rashes or hallucinations unless otherwise stated above in HPI. ____________________________________________   PHYSICAL EXAM:  VITAL SIGNS: Vitals:   06/12/17 1730 06/12/17 1800  BP: 122/69 (!) 121/96  Pulse: (!) 115 (!) 117  Resp:    Temp:      Constitutional: Alert and oriented. Ill appearing but in no acute distress. Eyes: Conjunctivae are normal.  Head: Atraumatic. Nose: No congestion/rhinnorhea. Mouth/Throat: Mucous membranes are moist.   Neck: No stridor. Painless ROM.  Cardiovascular: Normal rate, regular rhythm. Grossly normal heart sounds.  Good peripheral circulation. Respiratory: Normal respiratory effort.  No retractions. Lungs CTAB. Gastrointestinal: Soft and nontender. No distention. No abdominal bruits. No CVA tenderness. Musculoskeletal: No lower extremity tenderness  nor edema.  No joint effusions. Neurologic:  Normal speech and language. No gross focal neurologic deficits are appreciated. No facial droop Skin:  Skin is warm, dry and intact. No rash noted. Psychiatric: Mood and affect are normal. Speech and behavior are normal.  ____________________________________________   LABS (all labs ordered are listed, but only abnormal results are  displayed)  Results for orders placed or performed during the hospital encounter of 06/12/17 (from the past 24 hour(s))  Glucose, capillary     Status: Abnormal   Collection Time: 06/12/17 11:45 AM  Result Value Ref Range   Glucose-Capillary 377 (H) 65 - 99 mg/dL   Comment 1 Notify RN    Comment 2 Document in Chart   Basic metabolic panel     Status: Abnormal   Collection Time: 06/12/17 12:07 PM  Result Value Ref Range   Sodium 142 135 - 145 mmol/L   Potassium 3.6 3.5 - 5.1 mmol/L   Chloride 101 101 - 111 mmol/L   CO2 24 22 - 32 mmol/L   Glucose, Bld 410 (H) 65 - 99 mg/dL   BUN 16 6 - 20 mg/dL   Creatinine, Ser 0.46 0.44 - 1.00 mg/dL   Calcium 9.7 8.9 - 10.3 mg/dL   GFR calc non Af Amer >60 >60 mL/min   GFR calc Af Amer >60 >60 mL/min   Anion gap 17 (H) 5 - 15  CBC     Status: Abnormal   Collection Time: 06/12/17 12:07 PM  Result Value Ref Range   WBC 14.0 (H) 3.6 - 11.0 K/uL   RBC 4.61 3.80 - 5.20 MIL/uL   Hemoglobin 13.0 12.0 - 16.0 g/dL   HCT 38.8 35.0 - 47.0 %   MCV 84.0 80.0 - 100.0 fL   MCH 28.2 26.0 - 34.0 pg   MCHC 33.5 32.0 - 36.0 g/dL   RDW 14.3 11.5 - 14.5 %   Platelets 326 150 - 440 K/uL  Troponin I     Status: None   Collection Time: 06/12/17 12:07 PM  Result Value Ref Range   Troponin I <0.03 <0.03 ng/mL  Hepatic function panel     Status: Abnormal   Collection Time: 06/12/17 12:07 PM  Result Value Ref Range   Total Protein 7.6 6.5 - 8.1 g/dL   Albumin 4.3 3.5 - 5.0 g/dL   AST 19 15 - 41 U/L   ALT 19 14 - 54 U/L   Alkaline Phosphatase 152 (H) 38 - 126 U/L   Total Bilirubin 1.0 0.3 - 1.2 mg/dL   Bilirubin, Direct <0.1 (L) 0.1 - 0.5 mg/dL   Indirect Bilirubin NOT CALCULATED 0.3 - 0.9 mg/dL  Lipase, blood     Status: None   Collection Time: 06/12/17 12:07 PM  Result Value Ref Range   Lipase 17 11 - 51 U/L  Blood gas, venous     Status: Abnormal (Preliminary result)   Collection Time: 06/12/17  1:13 PM  Result Value Ref Range   pH, Ven 7.27 7.250 -  7.430   pCO2, Ven 64 (H) 44.0 - 60.0 mmHg   pO2, Ven PENDING 32.0 - 45.0 mmHg   Patient temperature 37.0    Collection site VENOUS    Sample type VENOUS   Glucose, capillary     Status: Abnormal   Collection Time: 06/12/17  1:32 PM  Result Value Ref Range   Glucose-Capillary 350 (H) 65 - 99 mg/dL  Glucose, capillary     Status: Abnormal   Collection Time:  06/12/17  4:17 PM  Result Value Ref Range   Glucose-Capillary 201 (H) 65 - 99 mg/dL  Urinalysis, Complete w Microscopic     Status: Abnormal   Collection Time: 06/12/17  5:00 PM  Result Value Ref Range   Color, Urine YELLOW (A) YELLOW   APPearance HAZY (A) CLEAR   Specific Gravity, Urine 1.031 (H) 1.005 - 1.030   pH 6.0 5.0 - 8.0   Glucose, UA >=500 (A) NEGATIVE mg/dL   Hgb urine dipstick MODERATE (A) NEGATIVE   Bilirubin Urine NEGATIVE NEGATIVE   Ketones, ur 80 (A) NEGATIVE mg/dL   Protein, ur 100 (A) NEGATIVE mg/dL   Nitrite NEGATIVE NEGATIVE   Leukocytes, UA NEGATIVE NEGATIVE   RBC / HPF 0-5 0 - 5 RBC/hpf   WBC, UA 6-30 0 - 5 WBC/hpf   Bacteria, UA RARE (A) NONE SEEN   Squamous Epithelial / LPF 0-5 (A) NONE SEEN   Mucous PRESENT   Basic metabolic panel     Status: Abnormal   Collection Time: 06/12/17  5:47 PM  Result Value Ref Range   Sodium 144 135 - 145 mmol/L   Potassium 3.7 3.5 - 5.1 mmol/L   Chloride 108 101 - 111 mmol/L   CO2 27 22 - 32 mmol/L   Glucose, Bld 251 (H) 65 - 99 mg/dL   BUN 17 6 - 20 mg/dL   Creatinine, Ser 0.48 0.44 - 1.00 mg/dL   Calcium 8.7 (L) 8.9 - 10.3 mg/dL   GFR calc non Af Amer >60 >60 mL/min   GFR calc Af Amer >60 >60 mL/min   Anion gap 9 5 - 15   ____________________________________________  EKG My review and personal interpretation at Time: 11:35   Indication: chest pain  Rate: 100  Rhythm: sinus Axis: left Other: normal intervals, no STEMI cretieria ____________________________________________  RADIOLOGY  I personally reviewed all radiographic images ordered to evaluate for  the above acute complaints and reviewed radiology reports and findings.  These findings were personally discussed with the patient.  Please see medical record for radiology report.  ____________________________________________   PROCEDURES  Procedure(s) performed:  Procedures    Critical Care performed: no ____________________________________________   INITIAL IMPRESSION / ASSESSMENT AND PLAN / ED COURSE  Pertinent labs & imaging results that were available during my care of the patient were reviewed by me and considered in my medical decision making (see chart for details).  DDX: fracture, contusion, acs, electrolyte abn, dehydration, pna, dka, pancreatitis,  LANIESHA DAS is a 51 y.o. who presents to the ED with above symptoms. Patient's afebrile and hemodynamic stable but is very ill-appearing area abdominal exam is soft and benign. CT imaging of head ordered to evaluate for ICH shows none. No evidence of pneumonia or heart failure on chest x-ray. Blood work does show evidence of dehydration with hyperglycemia but no metabolic acidosis. Based on her history of diabetes that is insulin-dependent, Iam concerned for DKA and will further evaluate with further laboratory testing. We'll continue IV fluids and IV antiemetics.  The patient will be placed on continuous pulse oximetry and telemetry for monitoring.  Laboratory evaluation will be sent to evaluate for the above complaints.     Clinical Course as of Jun 12 1909  Sun Jun 12, 2017  1544 Patient reassessed. States that she feels much better. Still waiting for urinalysis. Patient does have a persistent mild tachycardia but is requesting discharge home. We'll continue to evaluate.  [PR]  1908 Repeat BMP with persistent hyperglycemia  but no anion gap acidosis. Patient with persistent tachycardia and has been unable to tolerate oral hydration. At this point do feel patient will require admission for continued IV fluids given her  tachycardia and dehydration on Spec grav.   Have discussed with the patient and available family all diagnostics and treatments performed thus far and all questions were answered to the best of my ability. The patient demonstrates understanding and agreement with plan.   [PR]    Clinical Course User Index [PR] Merlyn Lot, MD     ____________________________________________   FINAL CLINICAL IMPRESSION(S) / ED DIAGNOSES  Final diagnoses:  Tachycardia  Dehydration  Nausea and vomiting in adult patient  Hyperglycemia      NEW MEDICATIONS STARTED DURING THIS VISIT:  New Prescriptions   No medications on file     Note:  This document was prepared using Dragon voice recognition software and may include unintentional dictation errors.    Merlyn Lot, MD 06/12/17 1911

## 2017-06-12 NOTE — ED Triage Notes (Addendum)
Pt arrived with husband. Triage per Spanish Interpreter.  Pt started having chest pain last night, worse this morning.  States it is radiating from the center of her chest to her neck. Pt has multiple myeloma as well and is getting chemo last on 6/4 at [facility]./  Pt was drinking last night and drank a whole bottle of wine. Pt c/o 10/10 chest pain that is squeezing.  CBG in triage 377 and has not had anything to eat or drink today. C/o nausea and shortness of breath

## 2017-06-12 NOTE — ED Notes (Signed)
Pt given sandwich tray and water 

## 2017-06-12 NOTE — H&P (Signed)
[facility] NAME: Ruth Walters    MR#:  [id number]  DATE OF BIRTH:  1966/07/02  DATE OF ADMISSION:  06/12/2017  PRIMARY CARE PHYSICIAN: Patient, No Pcp Per   REQUESTING/REFERRING PHYSICIAN: Quentin Cornwall, MD  CHIEF COMPLAINT:   Chief Complaint  Patient presents with  . Chest Pain    HISTORY OF PRESENT ILLNESS:  Ruth Walters  is a 51 y.o. female who presents with Complaint of chest pain, abdominal pain, intractable nausea and vomiting. Patient states that these symptoms began within the last 24 hours. She states that she was at a party last night and drank a whole bottle of wine. She states that she has not had any symptoms like this in the past. Here in the ED she was found to be significantly dehydrated, and her blood glucose level was pretty elevated. However, the rest of her workup was largely within normal limits. Given her dehydration, elevated glucose, and persistent nausea and vomiting despite multiple doses of antiemetics, hospitalists were called for admission.  PAST MEDICAL HISTORY:   Past Medical History:  Diagnosis Date  . Cancer ([place])    Multiple Myleoma  . Diabetes mellitus without complication ([place])   . Low blood pressure     PAST SURGICAL HISTORY:   Past Surgical History:  Procedure Laterality Date  . ABDOMINAL HYSTERECTOMY      SOCIAL HISTORY:   Social History  Substance Use Topics  . Smoking status: Never Smoker  . Smokeless tobacco: Never Used  . Alcohol use No    FAMILY HISTORY:   Family History  Problem Relation Age of Onset  . Leukemia Sister   . Hyperlipidemia Mother     DRUG ALLERGIES:  No Known Allergies  MEDICATIONS AT HOME:   Prior to Admission medications   Medication Sig Start Date End Date Taking? Authorizing Provider  aspirin EC 81 MG tablet Take 81 mg by mouth daily. 11/13/14   [provider]  dexamethasone (DECADRON) 4 MG tablet Take 20 mg by mouth  every Wednesday. 01/07/16   [provider]  glipiZIDE (GLUCOTROL) 10 MG tablet Take 10 mg by mouth 2 (two) times daily before a meal. 06/18/15   [provider]  insulin NPH Human (HUMULIN N) 100 UNIT/ML injection Inject 0.08 mLs (8 Units total) into the skin 2 (two) times daily before a meal. 02/17/16   Sainani, Belia Heman, MD  metFORMIN (GLUMETZA) 1000 MG (MOD) 24 hr tablet Take 2,000 mg by mouth daily before breakfast. 10/01/15 09/30/16  [provider]  pomalidomide (POMALYST) 3 MG capsule Take 3 mg by mouth daily. Take for 21 days. Rest for 7 days. Restart as soon as you get. 02/13/16   [provider]    REVIEW OF SYSTEMS:  Review of Systems  Constitutional: Negative for chills, fever, malaise/fatigue and weight loss.  HENT: Negative for ear pain, hearing loss and tinnitus.   Eyes: Negative for blurred vision, double vision, pain and redness.  Respiratory: Negative for cough, hemoptysis and shortness of breath.   Cardiovascular: Positive for chest pain. Negative for palpitations, orthopnea and leg swelling.  Gastrointestinal: Positive for abdominal pain, nausea and vomiting. Negative for constipation and diarrhea.  Genitourinary: Negative for dysuria, frequency and hematuria.  Musculoskeletal: Negative for back pain, joint pain and neck pain.  Skin:       No acne, rash, or lesions  Neurological: Negative for dizziness, tremors, focal weakness and weakness.  Endo/Heme/Allergies: Negative for polydipsia.  Does not bruise/bleed easily.  Psychiatric/Behavioral: Negative for depression. The patient is not nervous/anxious and does not have insomnia.      VITAL SIGNS:   Vitals:   06/12/17 1600 06/12/17 1700 06/12/17 1730 06/12/17 1800  BP: 114/65 131/76 122/69 (!) 121/96  Pulse: (!) 112 (!) 114 (!) 115 (!) 117  Resp:      Temp:      TempSrc:      SpO2: 95% 98% 96% 97%  Weight:      Height:       Wt Readings from Last 3 Encounters:  06/12/17 59 kg (130  lb)  10/16/16 59.4 kg (131 lb)  02/16/16 53 kg (116 lb 14.4 oz)    PHYSICAL EXAMINATION:  Physical Exam  Vitals reviewed. Constitutional: She is oriented to person, place, and time. She appears well-developed and well-nourished. She appears distressed.  HENT:  Head: Normocephalic and atraumatic.  Dry mucous membranes  Eyes: Conjunctivae and EOM are normal. Pupils are equal, round, and reactive to light. No scleral icterus.  Neck: Normal range of motion. Neck supple. No JVD present. No thyromegaly present.  Cardiovascular: Normal rate, regular rhythm and intact distal pulses.  Exam reveals no gallop and no friction rub.   No murmur heard. Respiratory: Effort normal and breath sounds normal. No respiratory distress. She has no wheezes. She has no rales.  GI: Soft. Bowel sounds are normal. She exhibits no distension. There is tenderness.  Musculoskeletal: Normal range of motion. She exhibits no edema.  No arthritis, no gout  Lymphadenopathy:    She has no cervical adenopathy.  Neurological: She is alert and oriented to person, place, and time. No cranial nerve deficit.  No dysarthria, no aphasia  Skin: Skin is warm and dry. No rash noted. No erythema.  Psychiatric: She has a normal mood and affect. Her behavior is normal. Judgment and thought content normal.    LABORATORY PANEL:   CBC  Recent Labs Lab 06/12/17 1207  WBC 14.0*  HGB 13.0  HCT 38.8  PLT 326   ------------------------------------------------------------------------------------------------------------------  Chemistries   Recent Labs Lab 06/12/17 1207 06/12/17 1747  NA 142 144  K 3.6 3.7  CL 101 108  CO2 24 27  GLUCOSE 410* 251*  BUN 16 17  CREATININE 0.46 0.48  CALCIUM 9.7 8.7*  AST 19  --   ALT 19  --   ALKPHOS 152*  --   BILITOT 1.0  --    ------------------------------------------------------------------------------------------------------------------  Cardiac Enzymes  Recent Labs Lab  06/12/17 1207  TROPONINI <0.03   ------------------------------------------------------------------------------------------------------------------  RADIOLOGY:  Dg Chest 2 View  Result Date: 06/12/2017 CLINICAL DATA:  Chest pain last night, worse this morning, radiating from center of chest in neck. History of multiple myeloma on chemo. Nausea and shortness of breath. EXAM: CHEST  2 VIEW COMPARISON:  Chest x-ray dated 10/16/2016. FINDINGS: Heart size and mediastinal contours are stable. Lungs are clear. No pleural effusion or pneumothorax seen. Osseous structures about the chest are unremarkable. IMPRESSION: No active cardiopulmonary disease. No evidence of pneumonia or pulmonary edema. Electronically Signed   By: Franki Cabot M.D.   On: 06/12/2017 12:07   Ct Head Wo Contrast  Result Date: 06/12/2017 CLINICAL DATA:  Fall today, nausea and vomiting. EXAM: CT HEAD WITHOUT CONTRAST TECHNIQUE: Contiguous axial images were obtained from the base of the skull through the vertex without intravenous contrast. COMPARISON:  None. FINDINGS: Brain: Ventricles are within normal limits in size and configuration. All areas of the brain demonstrate  normal gray-white matter attenuation. There is no mass, hemorrhage, edema or other evidence of acute parenchymal abnormality. No extra-axial hemorrhage. Vascular: No hyperdense vessel or unexpected calcification. Skull: Scattered lucent foci within the skull, largest within the upper left parietal bone measuring 1.1 cm greatest dimension. Sinuses/Orbits: Mucosal thickening within the ethmoid air cells, incompletely imaged. Orbital and periorbital soft tissues are unremarkable. Other: None. IMPRESSION: 1. No acute intracranial abnormality. No intracranial mass, hemorrhage or edema. No skull fracture. 2. Scattered lucent foci within the skull, compatible with patient's history of multiple myeloma. Electronically Signed   By: Franki Cabot M.D.   On: 06/12/2017 12:30     EKG:   Orders placed or performed during the hospital encounter of 06/12/17  . ED EKG within 10 minutes  . ED EKG within 10 minutes  . EKG 12-Lead  . EKG 12-Lead  . EKG 12-Lead  . EKG 12-Lead    IMPRESSION AND PLAN:  Principal Problem:   Intractable nausea and vomiting - unclear etiology at this time. Given the patient's diabetes there is some thought that perhaps she has some gastroparesis, though she has had no formal workup for the same. We will use Reglan for antiemetic of choice for now. This is also possibly something related to her alcohol use yesterday. IV fluids for hydration Active Problems:   Chest pain - unclear etiology at this time. No prior history of CAD. We will trend her troponins, get an echocardiogram. This is potentially due to her exacerbated by her nausea and vomiting.   Multiple myeloma ([place]) - continue home meds   Type 2 diabetes mellitus ([place]) - much better controlled after IV fluids and IV insulin in the ED. sliding scale insulin with corresponding glucose checks  All the records are reviewed and case discussed with ED provider. Management plans discussed with the patient and/or family.  DVT PROPHYLAXIS: SubQ lovenox  GI PROPHYLAXIS: None  ADMISSION STATUS: Observation  CODE STATUS: Full Code Status History    Date Active Date Inactive Code Status Order ID Comments User Context   10/16/2016  9:02 PM 10/19/2016  6:07 PM Full Code [id number]  Loletha Grayer, MD ED   02/14/2016 10:47 PM 02/17/2016  7:07 PM Full Code [id number]  Lance Coon, MD Inpatient      TOTAL TIME TAKING CARE OF THIS PATIENT: 40 minutes.   Mysti Haley [place] 06/12/2017, 7:27 PM  [facility]  Office  [phone]  CC: Primary care physician; Patient, No Pcp Per  Note:  This document was prepared using Dragon voice recognition software and may include unintentional dictation errors.

## 2017-06-12 NOTE — ED Notes (Signed)
Pt started vomiting again and states she does not want to eat anymore - she only ate 2 bites of a sandwich - Dr Quentin Cornwall notified

## 2017-06-12 NOTE — ED Notes (Signed)
Pt transported to room 258 

## 2017-06-13 ENCOUNTER — Observation Stay
Admit: 2017-06-13 | Discharge: 2017-06-13 | Disposition: A | Discharge status: Home / Self Care | Payer: Self-pay | Attending: Internal Medicine | Admitting: Internal Medicine

## 2017-06-13 LAB — BLOOD GAS, VENOUS
PATIENT TEMPERATURE: 37
PCO2 VEN: 64 mmHg — AB (ref 44.0–60.0)
pH, Ven: 7.27 (ref 7.250–7.430)

## 2017-06-13 LAB — CBC
HCT: 35.6 % (ref 35.0–47.0)
HEMOGLOBIN: 12 g/dL (ref 12.0–16.0)
MCH: 28.7 pg (ref 26.0–34.0)
MCHC: 33.7 g/dL (ref 32.0–36.0)
MCV: 85.2 fL (ref 80.0–100.0)
PLATELETS: 297 10*3/uL (ref 150–440)
RBC: 4.18 MIL/uL (ref 3.80–5.20)
RDW: 14.5 % (ref 11.5–14.5)
WBC: 11.6 10*3/uL — ABNORMAL HIGH (ref 3.6–11.0)

## 2017-06-13 LAB — ECHOCARDIOGRAM COMPLETE
Height: 59 in
WEIGHTICAEL: 2156.8 [oz_av]

## 2017-06-13 LAB — BASIC METABOLIC PANEL
Anion gap: 9 (ref 5–15)
BUN: 19 mg/dL (ref 6–20)
CALCIUM: 8.5 mg/dL — AB (ref 8.9–10.3)
CHLORIDE: 111 mmol/L (ref 101–111)
CO2: 24 mmol/L (ref 22–32)
CREATININE: 0.32 mg/dL — AB (ref 0.44–1.00)
GFR calc Af Amer: >60 mL/min (ref >60)
GFR calc non Af Amer: >60 mL/min (ref >60)
GLUCOSE: 232 mg/dL — AB (ref 65–99)
Potassium: 3 mmol/L — ABNORMAL LOW (ref 3.5–5.1)
Sodium: 144 mmol/L (ref 135–145)

## 2017-06-13 LAB — GLUCOSE, CAPILLARY
GLUCOSE-CAPILLARY: 190 mg/dL — AB (ref 65–99)
GLUCOSE-CAPILLARY: 205 mg/dL — AB (ref 65–99)
Glucose-Capillary: 243 mg/dL — ABNORMAL HIGH (ref 65–99)
Glucose-Capillary: 226 mg/dL — ABNORMAL HIGH (ref 65–99)

## 2017-06-13 LAB — TROPONIN I
Troponin I: <0.03 ng/mL (ref <0.03)
Troponin I: <0.03 ng/mL (ref <0.03)

## 2017-06-13 LAB — MAGNESIUM: MAGNESIUM: 1.8 mg/dL (ref 1.7–2.4)

## 2017-06-13 MED ORDER — PANTOPRAZOLE SODIUM 40 MG PO TBEC
40.0000 mg | DELAYED_RELEASE_TABLET | Freq: Every day | ORAL | Status: DC
  Administered 2017-06-13 – 2017-06-15 (×3): 40 mg via ORAL
  Filled 2017-06-13 (×3): qty 1

## 2017-06-13 MED ORDER — INSULIN NPH (HUMAN) (ISOPHANE) 100 UNIT/ML ~~LOC~~ SUSP: 8.0000 [IU] | Freq: Two times a day (BID) | SUBCUTANEOUS | Status: DC

## 2017-06-13 MED ORDER — SODIUM CHLORIDE 0.9 % IV SOLN
INTRAVENOUS | Status: DC
  Administered 2017-06-13: 12:00:00 via INTRAVENOUS

## 2017-06-13 MED ORDER — MAGNESIUM SULFATE 2 GM/50ML IV SOLN
2.0000 g | Freq: Once | INTRAVENOUS | Status: AC
  Administered 2017-06-13: 2 g via INTRAVENOUS
  Filled 2017-06-13: qty 50

## 2017-06-13 MED ORDER — INSULIN DETEMIR 100 UNIT/ML ~~LOC~~ SOLN
8.0000 [IU] | Freq: Two times a day (BID) | SUBCUTANEOUS | Status: DC
  Administered 2017-06-13 – 2017-06-15 (×4): 8 [IU] via SUBCUTANEOUS
  Filled 2017-06-13 (×6): qty 0.08

## 2017-06-13 MED ORDER — POTASSIUM CHLORIDE CRYS ER 20 MEQ PO TBCR
40.0000 meq | EXTENDED_RELEASE_TABLET | Freq: Once | ORAL | Status: AC
  Administered 2017-06-13: 40 meq via ORAL
  Filled 2017-06-13: qty 2

## 2017-06-13 NOTE — Progress Notes (Signed)
Patient ID: Ruth Walters, female   DOB: 11-15-1966, 51 y.o.   MRN: [id number]  [facility] PROGRESS NOTE  Ruth Walters BZJ:[id number] DOB: [birth year] DOA: 06/12/2017 PCP: Patient, No Pcp Per  HPI/Subjective: Patient seen earlier with nursing staff and translator. Patient is thirsty but not hungry. No further abdominal pain. Some nausea status but no vomiting.  Objective: Vitals:   06/13/17 0838 06/13/17 1155  BP: 120/61 (!) 113/59  Pulse: 94 90  Resp: 16 14  Temp: 98 F (36.7 C) 98.3 F (36.8 C)    Filed Weights   06/12/17 1140 06/12/17 2122  Weight: 59 kg (130 lb) 61.1 kg (134 lb 12.8 oz)    ROS: Review of Systems  Constitutional: Negative for chills and fever.  Eyes: Negative for blurred vision.  Respiratory: Negative for cough and shortness of breath.   Cardiovascular: Negative for chest pain.  Gastrointestinal: Positive for constipation and nausea. Negative for abdominal pain, diarrhea and vomiting.  Genitourinary: Negative for dysuria.  Musculoskeletal: Negative for joint pain.  Neurological: Negative for dizziness and headaches.   Exam: Physical Exam  Constitutional: She is oriented to person, place, and time.  HENT:  Nose: No mucosal edema.  Mouth/Throat: No oropharyngeal exudate or posterior oropharyngeal edema.  Eyes: Conjunctivae, EOM and lids are normal. Pupils are equal, round, and reactive to light.  Neck: No JVD present. Carotid bruit is not present. No edema present. No thyroid mass and no thyromegaly present.  Cardiovascular: S1 normal and S2 normal.  Exam reveals no gallop.   No murmur heard. Pulses:      Dorsalis pedis pulses are 2+ on the right side, and 2+ on the left side.  Respiratory: No respiratory distress. She has no wheezes. She has no rhonchi. She has no rales.  GI: Soft. Bowel sounds are normal. There is no tenderness.  Musculoskeletal:       Right shoulder: She exhibits no swelling.  Lymphadenopathy:    She has no  cervical adenopathy.  Neurological: She is alert and oriented to person, place, and time. No cranial nerve deficit.  Skin: Skin is warm. No rash noted. Nails show no clubbing.  Psychiatric: She has a normal mood and affect.      Data Reviewed: Basic Metabolic Panel:  Recent Labs Lab 06/12/17 1207 06/12/17 1747 06/13/17 0323 06/13/17 0932  NA 142 144 144  --   K 3.6 3.7 3.0*  --   CL 101 108 111  --   CO2 24 27 24   --   GLUCOSE 410* 251* 232*  --   BUN 16 17 19   --   CREATININE 0.46 0.48 0.32*  --   CALCIUM 9.7 8.7* 8.5*  --   MG  --   --   --  1.8   Liver Function Tests:  Recent Labs Lab 06/12/17 1207  AST 19  ALT 19  ALKPHOS 152*  BILITOT 1.0  PROT 7.6  ALBUMIN 4.3    Recent Labs Lab 06/12/17 1207  LIPASE 17   CBC:  Recent Labs Lab 06/12/17 1207 06/13/17 0323  WBC 14.0* 11.6*  HGB 13.0 12.0  HCT 38.8 35.6  MCV 84.0 85.2  PLT 326 297   Cardiac Enzymes:  Recent Labs Lab 06/12/17 1207 06/12/17 2110 06/13/17 0323 06/13/17 0932  TROPONINI <0.03 <0.03 <0.03 <0.03    CBG:  Recent Labs Lab 06/12/17 1332 06/12/17 1617 06/12/17 2138 06/13/17 0740 06/13/17 1155  GLUCAP 350* 201* 254* 243* 190*  Studies: Dg Chest 2 View  Result Date: 06/12/2017 CLINICAL DATA:  Chest pain last night, worse this morning, radiating from center of chest in neck. History of multiple myeloma on chemo. Nausea and shortness of breath. EXAM: CHEST  2 VIEW COMPARISON:  Chest x-ray dated 10/16/2016. FINDINGS: Heart size and mediastinal contours are stable. Lungs are clear. No pleural effusion or pneumothorax seen. Osseous structures about the chest are unremarkable. IMPRESSION: No active cardiopulmonary disease. No evidence of pneumonia or pulmonary edema. Electronically Signed   By: Franki Cabot M.D.   On: 06/12/2017 12:07   Dg Abdomen 1 View  Result Date: 06/12/2017 CLINICAL DATA:  Vomiting. EXAM: ABDOMEN - 1 VIEW COMPARISON:  None. FINDINGS: The bowel gas  pattern is normal. No radio-opaque calculi or other significant radiographic abnormality are seen. IMPRESSION: Negative. Electronically Signed   By: Dorise Bullion III M.D   On: 06/12/2017 20:14   Ct Head Wo Contrast  Result Date: 06/12/2017 CLINICAL DATA:  Fall today, nausea and vomiting. EXAM: CT HEAD WITHOUT CONTRAST TECHNIQUE: Contiguous axial images were obtained from the base of the skull through the vertex without intravenous contrast. COMPARISON:  None. FINDINGS: Brain: Ventricles are within normal limits in size and configuration. All areas of the brain demonstrate normal gray-white matter attenuation. There is no mass, hemorrhage, edema or other evidence of acute parenchymal abnormality. No extra-axial hemorrhage. Vascular: No hyperdense vessel or unexpected calcification. Skull: Scattered lucent foci within the skull, largest within the upper left parietal bone measuring 1.1 cm greatest dimension. Sinuses/Orbits: Mucosal thickening within the ethmoid air cells, incompletely imaged. Orbital and periorbital soft tissues are unremarkable. Other: None. IMPRESSION: 1. No acute intracranial abnormality. No intracranial mass, hemorrhage or edema. No skull fracture. 2. Scattered lucent foci within the skull, compatible with patient's history of multiple myeloma. Electronically Signed   By: Franki Cabot M.D.   On: 06/12/2017 12:30    Scheduled Meds: . aspirin EC  81 mg Oral Daily  . enoxaparin (LOVENOX) injection  40 mg Subcutaneous Q24H  . folic acid  1 mg Oral Daily  . insulin aspart  0-9 Units Subcutaneous TID AC & HS  . insulin detemir  8 Units Subcutaneous BID AC & HS  . metoCLOPramide (REGLAN) injection  5 mg Intravenous Q8H  . multivitamin with minerals  1 tablet Oral Daily   Continuous Infusions: . sodium chloride 50 mL/hr at 06/13/17 1224    Assessment/Plan:  1. Nausea vomiting with abdominal pain. Likely diabetic gastroparesis. Continue Reglan. Advance diet to full liquid diet.  Add Protonix. 2. Type 2 diabetes mellitus. Place on sliding scale and NPH insulin was changed to detemir insulin 3. Hypokalemia oral potassium replacement. Will also give IV magnesium since his borderline low 4. History of multiple myeloma  Code Status:     Code Status Orders        Start     Ordered   06/12/17 2103  Full code  Continuous     06/12/17 2102    Code Status History    Date Active Date Inactive Code Status Order ID Comments User Context   10/16/2016  9:02 PM 10/19/2016  6:07 PM Full Code [id number]  Loletha Grayer, MD ED   02/14/2016 10:47 PM 02/17/2016  7:07 PM Full Code [id number]  Lance Coon, MD Inpatient     Disposition Plan: Home once tolerating solid food  Time spent: 28 minutes  Loletha Grayer  [employer]

## 2017-06-13 NOTE — Progress Notes (Signed)
Patient arrived to 2A Room 258. Patient denies pain and all questions answered. Patient oriented to unit. Skin assessment completed with Vincente Liberty RN and skin intact. A&Ox4, VSS, and ST on verified tele-box #[id number]. Nursing staff will continue to monitor for any changes in patient status. Earleen Reaper, RN

## 2017-06-13 NOTE — Progress Notes (Signed)
*  PRELIMINARY RESULTS* Echocardiogram 2D Echocardiogram has been performed.  Sherrie Sport 06/13/2017, 9:11 AM

## 2017-06-13 NOTE — Care Management (Signed)
This RNCM has met with patient in Winter of 2017 for medications assistance. I am checking that status with Medication Management. She had a working glucometer at that time. Her PCP is with [facility]. RNCM will continue to follow.

## 2017-06-14 DIAGNOSIS — R112 Nausea with vomiting, unspecified: Secondary | ICD-10-CM

## 2017-06-14 LAB — BASIC METABOLIC PANEL
Anion gap: 8 (ref 5–15)
BUN: 18 mg/dL (ref 6–20)
CALCIUM: 8.3 mg/dL — AB (ref 8.9–10.3)
CHLORIDE: 102 mmol/L (ref 101–111)
CO2: 28 mmol/L (ref 22–32)
CREATININE: 0.4 mg/dL — AB (ref 0.44–1.00)
GFR calc non Af Amer: >60 mL/min (ref >60)
Glucose, Bld: 194 mg/dL — ABNORMAL HIGH (ref 65–99)
Potassium: 2.6 mmol/L — CL (ref 3.5–5.1)
SODIUM: 138 mmol/L (ref 135–145)

## 2017-06-14 LAB — HEMOGLOBIN A1C
HEMOGLOBIN A1C: 13.8 % — AB (ref 4.8–5.6)
MEAN PLASMA GLUCOSE: 349 mg/dL

## 2017-06-14 LAB — GLUCOSE, CAPILLARY
GLUCOSE-CAPILLARY: 185 mg/dL — AB (ref 65–99)
GLUCOSE-CAPILLARY: 190 mg/dL — AB (ref 65–99)
GLUCOSE-CAPILLARY: 122 mg/dL — AB (ref 65–99)
GLUCOSE-CAPILLARY: 270 mg/dL — AB (ref 65–99)

## 2017-06-14 LAB — HIV ANTIBODY (ROUTINE TESTING W REFLEX): HIV Screen 4th Generation wRfx: NONREACTIVE

## 2017-06-14 LAB — MAGNESIUM: Magnesium: 2.2 mg/dL (ref 1.7–2.4)

## 2017-06-14 MED ORDER — POTASSIUM CHLORIDE CRYS ER 20 MEQ PO TBCR
40.0000 meq | EXTENDED_RELEASE_TABLET | Freq: Once | ORAL | Status: AC
  Administered 2017-06-14: 40 meq via ORAL
  Filled 2017-06-14: qty 2

## 2017-06-14 MED ORDER — LIVING WELL WITH DIABETES BOOK - IN SPANISH
Freq: Once | Status: AC
  Administered 2017-06-14: 09:00:00
  Filled 2017-06-14: qty 1

## 2017-06-14 MED ORDER — POTASSIUM CHLORIDE IN NACL 20-0.9 MEQ/L-% IV SOLN
INTRAVENOUS | Status: DC
  Administered 2017-06-14: 17:00:00 via INTRAVENOUS
  Filled 2017-06-14 (×2): qty 1000

## 2017-06-14 MED ORDER — POTASSIUM CHLORIDE 10 MEQ/100ML IV SOLN
10.0000 meq | INTRAVENOUS | Status: AC
  Administered 2017-06-14 (×4): 10 meq via INTRAVENOUS
  Filled 2017-06-14 (×4): qty 100

## 2017-06-14 MED ORDER — SODIUM CHLORIDE 0.9 % IV SOLN
Freq: Once | INTRAVENOUS | Status: AC
  Administered 2017-06-14: 500 mL via INTRAVENOUS

## 2017-06-14 NOTE — Consult Note (Signed)
Jonathon Bellows MD, MRCP(U.K) [address]  [place]  [place], [place] [address]  Main: [phone]  Fax: [phone]  Consultation  Referring Provider:     Dr Manuella Ghazi  Primary Care Physician:  Patient, No Pcp Per Primary Gastroenterologist:  None       Reason for Consultation:     Vomiting   Date of Admission:  06/12/2017 Date of Consultation:  06/14/2017         HPI:   CARL BLEECKER is a 51 y.o. female admitted yesterday with abdominal pain , vomiting with nausea of 1 day duration .   This morning has had no abdominal pain , vomiting but continues to have nausea.  Came into the ER treated for dehydration , was found to have an elevated glucose level (410)and admitted.   Abdominal x ray normal .  She says she feels better but nurses say that she has complained of abdominal discomfort and not taking in much today. Been Npo since morning   Past Medical History:  Diagnosis Date  . Cancer ([place])    Multiple Myleoma  . Diabetes mellitus without complication ([place])   . Low blood pressure     Past Surgical History:  Procedure Laterality Date  . ABDOMINAL HYSTERECTOMY      Prior to Admission medications   Medication Sig Start Date End Date Taking? Authorizing Provider  aspirin EC 81 MG tablet Take 81 mg by mouth daily. 11/13/14  Yes [provider]  dexamethasone (DECADRON) 4 MG tablet Take 20 mg by mouth every Wednesday. 01/07/16  Yes [provider]  glipiZIDE (GLUCOTROL) 10 MG tablet Take 10 mg by mouth 2 (two) times daily before a meal. 06/18/15  Yes [provider]  insulin NPH Human (HUMULIN N) 100 UNIT/ML injection Inject 0.08 mLs (8 Units total) into the skin 2 (two) times daily before a meal. 02/17/16  Yes Sainani, Belia Heman, MD  metFORMIN (GLUMETZA) 1000 MG (MOD) 24 hr tablet Take 2,000 mg by mouth daily before breakfast. 10/01/15 06/12/17 Yes [provider]  pomalidomide (POMALYST) 3 MG capsule Take 3 mg by mouth daily. Take for 21  days. Rest for 7 days. Restart as soon as you get. 02/13/16  Yes [provider]    Family History  Problem Relation Age of Onset  . Leukemia Sister   . Hyperlipidemia Mother      Social History  Substance Use Topics  . Smoking status: Never Smoker  . Smokeless tobacco: Never Used  . Alcohol use No    Allergies as of 06/12/2017  . (No Known Allergies)    Review of Systems:    All systems reviewed and negative except where noted in HPI.   Physical Exam:  Vital signs in last 24 hours: Temp:  [97.6 F (36.4 C)-98.7 F (37.1 C)] 97.6 F (36.4 C) (06/26 1102) Pulse Rate:  [83-88] 83 (06/26 1102) Resp:  [18-20] 20 (06/26 1102) BP: (109-128)/(46-95) 128/95 (06/26 1102) SpO2:  [96 %-100 %] 98 % (06/26 1102) Last BM Date: 06/11/17 General:   Pleasant, cooperative in NAD Head:  Normocephalic and atraumatic. Eyes:   No icterus.   Conjunctiva pink. PERRLA. Ears:  Normal auditory acuity. Neck:  Supple; no masses or thyroidomegaly Lungs: Respirations even and unlabored. Lungs clear to auscultation bilaterally.   No wheezes, crackles, or rhonchi.  Heart:  Regular rate and rhythm;  Without murmur, clicks, rubs or gallops Abdomen:  Soft, nondistended, nontender. Normal bowel sounds. No appreciable masses or  hepatomegaly.  No rebound or guarding.  Rectal:  Not performed. Msk:  Symmetrical without gross deformities.   Extremities:  Without edema, cyanosis or clubbing. Neurologic:  Alert and oriented x3;  grossly normal neurologically.  Psych:  Alert and cooperative. Normal affect.  LAB RESULTS:  Recent Labs  06/12/17 1207 06/13/17 0323  WBC 14.0* 11.6*  HGB 13.0 12.0  HCT 38.8 35.6  PLT 326 297   BMET  Recent Labs  06/12/17 1747 06/13/17 0323 06/14/17 0600  NA 144 144 138  K 3.7 3.0* 2.6*  CL 108 111 102  CO2 _0 GLUCOSE 251* 232* 194*  BUN _1 CREATININE 0.48 0.32* 0.40*  CALCIUM 8.7* 8.5* 8.3*   LFT  Recent Labs  06/12/17 1207  PROT  7.6  ALBUMIN 4.3  AST 19  ALT 19  ALKPHOS 152*  BILITOT 1.0  BILIDIR <0.1*  IBILI NOT CALCULATED   PT/INR No results for input(s): LABPROT, INR in the last 72 hours.  STUDIES: Dg Chest 2 View  Result Date: 06/12/2017 CLINICAL DATA:  Chest pain last night, worse this morning, radiating from center of chest in neck. History of multiple myeloma on chemo. Nausea and shortness of breath. EXAM: CHEST  2 VIEW COMPARISON:  Chest x-ray dated 10/16/2016. FINDINGS: Heart size and mediastinal contours are stable. Lungs are clear. No pleural effusion or pneumothorax seen. Osseous structures about the chest are unremarkable. IMPRESSION: No active cardiopulmonary disease. No evidence of pneumonia or pulmonary edema. Electronically Signed   By: Franki Cabot M.D.   On: 06/12/2017 12:07   Dg Abdomen 1 View  Result Date: 06/12/2017 CLINICAL DATA:  Vomiting. EXAM: ABDOMEN - 1 VIEW COMPARISON:  None. FINDINGS: The bowel gas pattern is normal. No radio-opaque calculi or other significant radiographic abnormality are seen. IMPRESSION: Negative. Electronically Signed   By: Dorise Bullion III M.D   On: 06/12/2017 20:14   Ct Head Wo Contrast  Result Date: 06/12/2017 CLINICAL DATA:  Fall today, nausea and vomiting. EXAM: CT HEAD WITHOUT CONTRAST TECHNIQUE: Contiguous axial images were obtained from the base of the skull through the vertex without intravenous contrast. COMPARISON:  None. FINDINGS: Brain: Ventricles are within normal limits in size and configuration. All areas of the brain demonstrate normal gray-white matter attenuation. There is no mass, hemorrhage, edema or other evidence of acute parenchymal abnormality. No extra-axial hemorrhage. Vascular: No hyperdense vessel or unexpected calcification. Skull: Scattered lucent foci within the skull, largest within the upper left parietal bone measuring 1.1 cm greatest dimension. Sinuses/Orbits: Mucosal thickening within the ethmoid air cells, incompletely imaged.  Orbital and periorbital soft tissues are unremarkable. Other: None. IMPRESSION: 1. No acute intracranial abnormality. No intracranial mass, hemorrhage or edema. No skull fracture. 2. Scattered lucent foci within the skull, compatible with patient's history of multiple myeloma. Electronically Signed   By: Franki Cabot M.D.   On: 06/12/2017 12:30      Impression / Plan:   ORENA CAVAZOS is a 51 y.o. y/o female with 1 day history of acute nausea,vomiting and abdominal pain which seems to be rapidly improving . She had associated hyperglycemia.   Impression : likely acute food poisoning with secondary vomiting or may have had vomiting/abdominal pain secondary to hyperglycemia .   Plan  1. Continue supportive care, PPI, fluids,commence on clear liquid diet ,  glucose control. If issue persists or does not get better then will need endoscopy tomorrow.   Thank you for involving me in the care  of this patient.      LOS: 0 days   Jonathon Bellows, MD  06/14/2017, 11:56 AM

## 2017-06-14 NOTE — Progress Notes (Signed)
Pain noted    0/10.   

## 2017-06-14 NOTE — Care Management (Addendum)
Feb. 2017 last visit to Medication Management as patient told Langley Adie that she would be receiving her medications through [facility]. I have sent referral to [facility] to see if they can assist patient however she will need to be able to get to appointments. New referrals [facility] and [facility]. This RNCM also made referral last June to Medication management and patient agreed. I have sent message to Langley Adie and Aundra Millet (from both clinics) for assistance to help meet this patient's needs. Application (in Spanish) provided to patient.

## 2017-06-14 NOTE — Progress Notes (Signed)
Inpatient Diabetes Program Recommendations  AACE/ADA: New Consensus Statement on Inpatient Glycemic Control (2015)  Target Ranges:  Prepandial:   less than 140 mg/dL      Peak postprandial:   less than 180 mg/dL (1-2 hours)      Critically ill patients:  140 - 180 mg/dL   Results for Ruth Walters, Ruth Walters (MRN [id number]) as of 06/14/2017 12:30  Ref. Range 06/13/2017 07:40 06/13/2017 11:55 06/13/2017 16:27 06/13/2017 20:46 06/14/2017 07:41 06/14/2017 11:37  Glucose-Capillary Latest Ref Range: 65 - 99 mg/dL 243 (H) 190 (H) 226 (H) 205 (H) 185 (H) 190 (H)   Review of Glycemic Control  Diabetes history: DM2 Outpatient Diabetes medications: NPH 10 units QHS, Novolog 8 units TID with meals, Glumetza 1000 mg BID, Glipizide 10 mg TID with meals Current orders for Inpatient glycemic control: Levemir 8 units BID, Novolog 0-9 units ACHS  Inpatient Diabetes Program Recommendations: HgbA1C: A1C 13.8% on 06/13/17 indicating an average glucose of 349 mg/dl over the past 2-3 months. Outpatient Follow Up: Patient states she was going to [facility] but she has not been able to go in about 3 months due to transportation issue and not able to pay $25 copay. Patient will need assistance with follow up and medication management.   NOTE: Spoke with patient (with translator) about diabetes and home regimen for diabetes control. Patient reports that she has been going to [facility] but she has not been able to go in about 3 months due to transportation issue and not able to pay $25 copay. Patient reports that she takes NPH 10 units QHS, Novolog (she thinks that is the name of it, pen has orange on pen label ) 8 units TID with meals, Glumetza 1000 mg BID, Glipizide 10 mg TID with meals as an outpatient for diabetes control. Patient reports that she was taking all DM medications as noted above until she ran out of all of them about 1 week ago. Patient states that she needs refills of  all the medications but she has not been able to follow up at the clinic. Patient states that she has glucose monitoring supplies and that when she checks her glucose it is usually greater than 300 mg/dl.  Inquired about knowledge about an A1C and patient states that she does not know what an A1C is.  Discussed A1C results (13.8% on 06/13/17) and explained that her current A1C indicates an average glucose of 349 mg/dl over the past 2-3 months. Discussed glucose and A1C goals. Discussed importance of checking CBGs and maintaining good CBG control to prevent long-term and short-term complications. Explained how hyperglycemia leads to damage within blood vessels which lead to the common complications seen with uncontrolled diabetes. Stressed to the patient the importance of improving glycemic control to prevent further complications from uncontrolled diabetes. Patient given Living Well with Diabetes booklet (Spanish) and encouraged to read book. Patient states that she is not able to read well.  Informed patient that CM is consulted for follow up and medication needs. Patient verbalized understanding of information discussed and she states that she has no further questions at this time related to diabetes.  Thanks, Barnie Alderman, RN, MSN, CDE Diabetes Coordinator Inpatient Diabetes Program [phone] (Team Pager)

## 2017-06-14 NOTE — Progress Notes (Signed)
Pt complaining of headache. Will medicate with tylenol.

## 2017-06-14 NOTE — Progress Notes (Signed)
Prime paged to make aware of critical potasium level of 2.6, awaiting on phone call.

## 2017-06-14 NOTE — Progress Notes (Signed)
Patient ID: Ruth Walters, female   DOB: November 14, 1966, 51 y.o.   MRN: [id number]   [facility] PROGRESS NOTE  Ruth Walters XYV:[id number] DOB: [birth year] DOA: 06/12/2017 PCP: Patient, No Pcp Per  HPI/Subjective: Patient seen earlier with nursing staff and translator. Patient today states that the food gets stuck in her chest. It doesn't matter whether it's liquids or solids. If she does it slowly and swallows more it will go down.  Objective: Vitals:   06/14/17 0906 06/14/17 1102  BP: (!) 116/46 (!) 128/95  Pulse: 83 83  Resp: 18 20  Temp: 97.7 F (36.5 C) 97.6 F (36.4 C)    Filed Weights   06/12/17 1140 06/12/17 2122  Weight: 59 kg (130 lb) 61.1 kg (134 lb 12.8 oz)    ROS: Review of Systems  Constitutional: Negative for chills and fever.  Eyes: Negative for blurred vision.  Respiratory: Negative for cough and shortness of breath.   Cardiovascular: Negative for chest pain.  Gastrointestinal: Positive for abdominal pain, constipation and nausea. Negative for diarrhea and vomiting.  Genitourinary: Negative for dysuria.  Musculoskeletal: Negative for joint pain.  Neurological: Negative for dizziness and headaches.   Exam: Physical Exam  Constitutional: She is oriented to person, place, and time.  HENT:  Nose: No mucosal edema.  Mouth/Throat: No oropharyngeal exudate or posterior oropharyngeal edema.  Eyes: Conjunctivae, EOM and lids are normal. Pupils are equal, round, and reactive to light.  Neck: No JVD present. Carotid bruit is not present. No edema present. No thyroid mass and no thyromegaly present.  Cardiovascular: S1 normal and S2 normal.  Exam reveals no gallop.   No murmur heard. Pulses:      Dorsalis pedis pulses are 2+ on the right side, and 2+ on the left side.  Respiratory: No respiratory distress. She has no wheezes. She has no rhonchi. She has no rales.  GI: Soft. Bowel sounds are normal. There is no tenderness.  Musculoskeletal:       Right  shoulder: She exhibits no swelling.  Lymphadenopathy:    She has no cervical adenopathy.  Neurological: She is alert and oriented to person, place, and time. No cranial nerve deficit.  Skin: Skin is warm. No rash noted. Nails show no clubbing.  Psychiatric: She has a normal mood and affect.      Data Reviewed: Basic Metabolic Panel:  Recent Labs Lab 06/12/17 1207 06/12/17 1747 06/13/17 0323 06/13/17 0932 06/14/17 0600  NA 142 144 144  --  138  K 3.6 3.7 3.0*  --  2.6*  CL 101 108 111  --  102  CO2 24 27 24   --  28  GLUCOSE 410* 251* 232*  --  194*  BUN 16 17 19   --  18  CREATININE 0.46 0.48 0.32*  --  0.40*  CALCIUM 9.7 8.7* 8.5*  --  8.3*  MG  --   --   --  1.8 2.2   Liver Function Tests:  Recent Labs Lab 06/12/17 1207  AST 19  ALT 19  ALKPHOS 152*  BILITOT 1.0  PROT 7.6  ALBUMIN 4.3    Recent Labs Lab 06/12/17 1207  LIPASE 17   CBC:  Recent Labs Lab 06/12/17 1207 06/13/17 0323  WBC 14.0* 11.6*  HGB 13.0 12.0  HCT 38.8 35.6  MCV 84.0 85.2  PLT 326 297   Cardiac Enzymes:  Recent Labs Lab 06/12/17 1207 06/12/17 2110 06/13/17 0323 06/13/17 0932  TROPONINI <0.03 <0.03 <0.03 <0.03  CBG:  Recent Labs Lab 06/13/17 1155 06/13/17 1627 06/13/17 2046 06/14/17 0741 06/14/17 1137  GLUCAP 190* 226* 205* 185* 190*      Studies: Dg Abdomen 1 View  Result Date: 06/12/2017 CLINICAL DATA:  Vomiting. EXAM: ABDOMEN - 1 VIEW COMPARISON:  None. FINDINGS: The bowel gas pattern is normal. No radio-opaque calculi or other significant radiographic abnormality are seen. IMPRESSION: Negative. Electronically Signed   By: Dorise Bullion III M.D   On: 06/12/2017 20:14    Scheduled Meds: . aspirin EC  81 mg Oral Daily  . enoxaparin (LOVENOX) injection  40 mg Subcutaneous Q24H  . folic acid  1 mg Oral Daily  . insulin aspart  0-9 Units Subcutaneous TID AC & HS  . insulin detemir  8 Units Subcutaneous BID AC & HS  . metoCLOPramide (REGLAN) injection   5 mg Intravenous Q8H  . multivitamin with minerals  1 tablet Oral Daily  . pantoprazole  40 mg Oral Daily   Continuous Infusions:   Assessment/Plan:  1. Nausea vomiting with abdominal pain. Now complains of dysphagia. I made nothing by mouth and contacted gastroenterology to see the patient to evaluate for potential EGD. Continue Reglan and Protonix 2. Hypokalemia. Replace potassium orally and IV today. Recheck tomorrow. 3. Type 2 diabetes mellitus. Place on sliding scale and NPH insulin was changed to detemir insulin. I don't want to increase insulin until back on her diet. 4. History of multiple myeloma  Code Status:     Code Status Orders        Start     Ordered   06/12/17 2103  Full code  Continuous     06/12/17 2102    Code Status History    Date Active Date Inactive Code Status Order ID Comments User Context   10/16/2016  9:02 PM 10/19/2016  6:07 PM Full Code [id number]  Loletha Grayer, MD ED   02/14/2016 10:47 PM 02/17/2016  7:07 PM Full Code [id number]  Lance Coon, MD Inpatient     Disposition Plan: Potentially home in the next day or so.  Time spent: 28 minutes  Loletha Grayer  [employer]

## 2017-06-14 NOTE — Progress Notes (Signed)
Pt alert and oriented x4, no complaints of pain or discomfort.  Bed in low position, call bell within reach.  Bed alarms on and functioning.  Assessment done and charted.  Will continue to monitor and do hourly rounding throughout the shift 

## 2017-06-15 LAB — BASIC METABOLIC PANEL
Anion gap: 5 (ref 5–15)
BUN: 13 mg/dL (ref 6–20)
CALCIUM: 8.5 mg/dL — AB (ref 8.9–10.3)
CO2: 27 mmol/L (ref 22–32)
Chloride: 107 mmol/L (ref 101–111)
GLUCOSE: 151 mg/dL — AB (ref 65–99)
POTASSIUM: 3.1 mmol/L — AB (ref 3.5–5.1)
SODIUM: 139 mmol/L (ref 135–145)

## 2017-06-15 LAB — GLUCOSE, CAPILLARY
GLUCOSE-CAPILLARY: 259 mg/dL — AB (ref 65–99)
Glucose-Capillary: 147 mg/dL — ABNORMAL HIGH (ref 65–99)

## 2017-06-15 LAB — MAGNESIUM: MAGNESIUM: 2.1 mg/dL (ref 1.7–2.4)

## 2017-06-15 MED ORDER — POTASSIUM CHLORIDE ER 10 MEQ PO TBCR: EXTENDED_RELEASE_TABLET | ORAL | 0 refills | Status: DC

## 2017-06-15 MED ORDER — METOCLOPRAMIDE HCL 5 MG PO TABS: 5.0000 mg | ORAL_TABLET | Freq: Three times a day (TID) | ORAL | 0 refills | Status: DC

## 2017-06-15 MED ORDER — GLIPIZIDE 10 MG PO TABS: 10.0000 mg | ORAL_TABLET | Freq: Two times a day (BID) | ORAL | 0 refills | Status: DC

## 2017-06-15 MED ORDER — INSULIN ASPART 100 UNIT/ML ~~LOC~~ SOLN: 8.0000 [IU] | Freq: Three times a day (TID) | SUBCUTANEOUS | 0 refills | Status: DC

## 2017-06-15 MED ORDER — METFORMIN HCL ER (MOD) 1000 MG PO TB24: 1000.0000 mg | ORAL_TABLET | Freq: Every day | ORAL | 0 refills | Status: DC

## 2017-06-15 MED ORDER — INSULIN NPH (HUMAN) (ISOPHANE) 100 UNIT/ML ~~LOC~~ SUSP: 10.0000 [IU] | Freq: Two times a day (BID) | SUBCUTANEOUS | 0 refills | Status: DC

## 2017-06-15 MED ORDER — METOCLOPRAMIDE HCL 5 MG PO TABS
5.0000 mg | ORAL_TABLET | Freq: Three times a day (TID) | ORAL | Status: DC
  Administered 2017-06-15: 5 mg via ORAL
  Filled 2017-06-15: qty 1

## 2017-06-15 MED ORDER — ADULT MULTIVITAMIN W/MINERALS CH: 1.0000 | ORAL_TABLET | Freq: Every day | ORAL | 0 refills | Status: DC

## 2017-06-15 MED ORDER — POTASSIUM CHLORIDE CRYS ER 20 MEQ PO TBCR
40.0000 meq | EXTENDED_RELEASE_TABLET | Freq: Once | ORAL | Status: AC
  Administered 2017-06-15: 40 meq via ORAL
  Filled 2017-06-15: qty 2

## 2017-06-15 MED ORDER — PANTOPRAZOLE SODIUM 40 MG PO TBEC: 40.0000 mg | DELAYED_RELEASE_TABLET | Freq: Every day | ORAL | 0 refills | Status: DC

## 2017-06-15 NOTE — Discharge Instructions (Signed)
Hiperglucemia  (Hyperglycemia)  La hiperglucemia se produce cuando el nivel de glucosa en la sangre es demasiado alto. La glucosa es un tipo de azcar que es la principal fuente de energa del cuerpo. Hormonas, como la insulina y el glucagn, controlan el nivel de glucosa en la sangre. La insulina reduce el nivel de glucosa en la sangre, mientras que el glucagn lo aumenta. A veces, el cuerpo no elabora la suficiente cantidad de insulina o no puede responder normalmente a la insulina que produce. Esto puede hacer que aumente el nivel de glucosa en la sangre. La hiperglucemia le puede ocurrir tanto a las personas que tienen diabetes como a las que no la tienen. Puede desarrollarse despacio o rpidamente y ser una emergencia mdica.  CAUSAS  Esta afeccin puede ser causada por lo siguiente:   Tener prediabetes. Las personas con prediabetes tienen hiperglucemia que no es lo suficientemente alta como para ser diagnosticada como diabetes.   Tener diabetes y no saberlo.   Tener diabetes y:  ? No seguir el plan de comidas.  ? No tomar los medicamentos para la diabetes o no tomarlos correctamente.  ? Realizar menos actividad fsica que lo normal.  ? Ser demasiado activo fsicamente.  ? Estar enfermo o tener una infeccin.  ? Tomar otros tipos de medicamentos que afectan negativamente el control de la diabetes o incrementan la glucemia en la sangre.   Estar estresado.   Tomar corticoides.  FACTORES DE RIESGO  Es ms probable que tenga esta afeccin si tiene riesgo de padecer diabetes. Entre ellos se incluyen personas que:   Tienen antecedentes familiares de diabetes.   Son afroamericanas, asiticas, hispanas o latinas, o descendientes de indgenas norteamericanos.   Tienen ms de 45 aos.   Han tenido hiperglucemia durante un embarazo.   Tienen sobrepeso u obesidad.   Tienen hipertensin arterial.   Tiene el colesterol elevado.   No realizan suficiente actividad fsica (tiene un estilo de vida  sedentario).  SNTOMAS  Puede tener hiperglucemia y no tener ningn sntoma. Si tiene sntomas, estos pueden incluir los siguientes:   Aumento de la cantidad de orina.   Aumento de la sed.   Sequedad en la boca.   Aumento o prdida del apetito.   Cambios en la visin, como visin borrosa.   Cansancio (fatiga).   Aliento con olor a fruta.   Debilidad.   Aumento o prdida de peso involuntarios. La prdida de peso puede ser muy rpida.   Hormigueo o adormecimiento en las manos o los pies.   Dolor de cabeza.   Cuando pellizca la piel, esta no vuelve rpidamente a su lugar al soltarla.   Dolor en el abdomen.   Cortes o moretones que tardan en sanarse.  DIAGNSTICO  Su mdico puede sospechar que tiene hiperglucemia por su historia clnica y un examen fsico. El diagnstico se realiza con un anlisis de sangre que mide el nivel de glucosa en la sangre. Tambin pueden hacerle otros anlisis de sangre para ayudar a encontrar la causa de la hiperglucemia. Estos incluyen los siguientes:   Un anlisis de sangre de A1C (hemoglobina A1c). Este estudio muestra cul fue el nivel promedio de glucosa en la sangre en un perodo de 3 meses.   Un anlisis de glucosa en la sangre despus de ocho horas de ayuno.   Prueba de tolerancia a la glucosa. Este estudio se realiza despus de tomar una bebida azucarada.  TRATAMIENTO  El tratamiento depende de la causa de esta afeccin. El tratamiento   puede incluir lo siguiente:   Aadir, cambiar o ajustar los medicamentos para la diabetes. Es muy importante tomar cualquier medicamento para el tratamiento de la diabetes como se lo haya indicado el mdico.    Cambiar o ajustar el plan de comidas.   Tratar una enfermedad o afeccin.   Controlar con ms frecuencia el nivel de glucosa en la sangre.   Cambiar el plan de ejercicios.   Disminuir o interrumpir la ingesta de corticoides.   Cambios en el estilo de vida. Estos pueden incluir los siguientes:  ? Realizar actividad  fsica.  ? Bajar de peso.  ? Mantener una dieta saludable.   Entender cules deben ser sus niveles de glucosa y qu hacer si aumentan mucho.  INSTRUCCIONES PARA EL CUIDADO EN EL HOGAR   Si tiene diabetes, siga su plan de control de la diabetes. Haga lo siguiente:  ? Tome los medicamentos segn las indicaciones.  ? Siga el plan de ejercicio.  ? Siga el plan de comidas.  ? Controle su nivel de glucosa en la sangre peridicamente. Controle su nivel de glucosa en la sangre antes y despus de ejercitarse. Si hace ejercicio durante ms tiempo o de manera diferente de lo habitual, asegrese de controlar su nivel de glucosa en la sangre con mayor frecuencia.  ? Use su pulsera de alerta mdica, que indica que usted tiene diabetes.   Beba suficiente lquido para mantener la orina clara o de color amarillo plido.   Mantenga un peso saludable con dieta y ejercicios. Pregntele a su mdico cul es su peso ideal.   No consuma ningn producto que contenga tabaco, lo que incluye cigarrillos, tabaco de mascar y cigarrillos electrnicos. Si necesita ayuda para dejar de fumar, consulte al mdico.   Limite el consumo de alcohol a no ms de 1 medida por da si es mujer y no est embarazada, y 2 medidas si es hombre. Una medida equivale a 12onzas de cerveza, 5onzas de vino o 1onza de bebidas alcohlicas de alta graduacin.   Concurra a todas las visitas de control como se lo haya indicado el mdico. Esto es importante.  SOLICITE ATENCIN MDICA SI:   Su nivel de glucosa en la sangre est por encima del rango indicado en dos mediciones seguidas.   Tiene episodios frecuentes de hiperglucemia.  SOLICITE ATENCIN MDICA DE INMEDIATO SI:   Tiene dificultad para respirar.   Tiene cambios en la manera se sentirse, pensar o actuar (estado mental).   Tiene nuseas o vmitos que no desaparecen.  Estos sntomas pueden representar un problema que constituye una emergencia. No espere hasta que los sntomas desaparezcan. Solicite  atencin mdica de inmediato. Comunquese con el servicio de emergencias de su localidad (911 en los [place]). No conduzca por sus propios medios hasta el hospital.  Esta informacin no tiene como fin reemplazar el consejo del mdico. Asegrese de hacerle al mdico cualquier pregunta que tenga.  Document Released: 12/06/2005 Document Revised: 03/29/2016  Elsevier Interactive Patient Education  2017 [employer]

## 2017-06-15 NOTE — Discharge Summary (Signed)
[facility] NAME: Ruth Walters    MR#:  [id number]  DATE OF BIRTH:  03/05/1966  DATE OF ADMISSION:  06/12/2017 ADMITTING PHYSICIAN: Lance Coon, MD  DATE OF DISCHARGE: 06/15/2017  3:45 PM  PRIMARY CARE PHYSICIAN: [facility]   ADMISSION DIAGNOSIS:  Dehydration [E86.0] Tachycardia [R00.0] Hyperglycemia [R73.9] Nausea and vomiting in adult patient [R11.2]  DISCHARGE DIAGNOSIS:  Principal Problem:   Intractable nausea and vomiting Active Problems:   Multiple myeloma (HCC)   Type 2 diabetes mellitus ([place])   Chest pain   SECONDARY DIAGNOSIS:   Past Medical History:  Diagnosis Date  . Cancer ([place])    Multiple Myleoma  . Diabetes mellitus without complication ([place])   . Low blood pressure     HOSPITAL COURSE:   1.  Nausea vomiting with abdominal pain and dysphagia. Gastroenterology just wanted to observe the patient. Patient improved with Reglan and Protonix and tolerated diet at the time of disposition. This is diabetic gastroparesis. 2. Hypokalemia. Potassium replaced. Now that she is on a diet hopefully won't need much replacement upon going home 3. Type 2 diabetes mellitus. Restart NPH insulin and short acting insulin prior to meals. Place back on Glucophage and glipizide. Diet discussed at length. 4. Multiple myeloma continue usual medications as outpatient  DISCHARGE CONDITIONS:   Satisfactory  CONSULTS OBTAINED:  Treatment Team:  Jonathon Bellows, MD  DRUG ALLERGIES:  No Known Allergies  DISCHARGE MEDICATIONS:   Discharge Medication List as of 06/15/2017  2:34 PM    START taking these medications   Details  metoCLOPramide (REGLAN) 5 MG tablet Take 1 tablet (5 mg total) by mouth 3 (three) times daily before meals., Starting Wed 06/15/2017, Print    Multiple Vitamin (MULTIVITAMIN WITH MINERALS) TABS tablet Take 1 tablet by mouth daily., Starting Wed 06/15/2017, Print    pantoprazole (PROTONIX) 40  MG tablet Take 1 tablet (40 mg total) by mouth daily., Starting Wed 06/15/2017, Print    potassium chloride (K-DUR) 10 MEQ tablet One tab daily for one week, Print      CONTINUE these medications which have CHANGED   Details  glipiZIDE (GLUCOTROL) 10 MG tablet Take 1 tablet (10 mg total) by mouth 2 (two) times daily before a meal., Starting Wed 06/15/2017, Print    insulin aspart (NOVOLOG) 100 UNIT/ML injection Inject 8 Units into the skin 3 (three) times daily with meals. Pt ran out about 1 week ago, Starting Wed 06/15/2017, Print    insulin NPH Human (HUMULIN N) 100 UNIT/ML injection Inject 0.1 mLs (10 Units total) into the skin 2 (two) times daily before a meal., Starting Wed 06/15/2017, Print    metFORMIN (GLUMETZA) 1000 MG (MOD) 24 hr tablet Take 1 tablet (1,000 mg total) by mouth daily with breakfast., Starting Wed 06/15/2017, Until Sun 02/25/2019, Print      CONTINUE these medications which have NOT CHANGED   Details  aspirin EC 81 MG tablet Take 81 mg by mouth daily., Starting Wed 11/13/2014, Historical Med    dexamethasone (DECADRON) 4 MG tablet Take 20 mg by mouth every Wednesday., Starting Wed 01/07/2016, Historical Med    pomalidomide (POMALYST) 3 MG capsule Take 3 mg by mouth daily. Take for 21 days. Rest for 7 days. Restart as soon as you get., Starting Fri 02/13/2016, Historical Med         DISCHARGE INSTRUCTIONS:   Follow-up medical doctor one week  If you experience worsening of your admission symptoms, develop  shortness of breath, life threatening emergency, suicidal or homicidal thoughts you must seek medical attention immediately by calling 911 or calling your MD immediately  if symptoms less severe.  You Must read complete instructions/literature along with all the possible adverse reactions/side effects for all the Medicines you take and that have been prescribed to you. Take any new Medicines after you have completely understood and accept all the possible adverse  reactions/side effects.   Please note  You were cared for by a hospitalist during your hospital stay. If you have any questions about your discharge medications or the care you received while you were in the hospital after you are discharged, you can call the unit and asked to speak with the hospitalist on call if the hospitalist that took care of you is not available. Once you are discharged, your primary care physician will handle any further medical issues. Please note that NO REFILLS for any discharge medications will be authorized once you are discharged, as it is imperative that you return to your primary care physician (or establish a relationship with a primary care physician if you do not have one) for your aftercare needs so that they can reassess your need for medications and monitor your lab values.    Today   CHIEF COMPLAINT:   Chief Complaint  Patient presents with  . Chest Pain    HISTORY OF PRESENT ILLNESS:  Ruth Walters  is a 51 y.o. female with a known history of Diabetes presented with nausea vomiting and abdominal pain   VITAL SIGNS:  Blood pressure 118/62, pulse 81, temperature 98.3 F (36.8 C), temperature source Oral, resp. rate 16, height _0  (1.499 m), weight 61.1 kg (134 lb 12.8 oz), SpO2 97 %.  I/O:   Intake/Output Summary (Last 24 hours) at 06/15/17 1818 Last data filed at 06/15/17 1200  Gross per 24 hour  Intake             1140 ml  Output             1450 ml  Net             -310 ml    PHYSICAL EXAMINATION:  GENERAL:  51 y.o.-year-old patient lying in the bed with no acute distress.  EYES: Pupils equal, round, reactive to light and accommodation. No scleral icterus. Extraocular muscles intact.  HEENT: Head atraumatic, normocephalic. Oropharynx and nasopharynx clear.  NECK:  Supple, no jugular venous distention. No thyroid enlargement, no tenderness.  LUNGS: Normal breath sounds bilaterally, no wheezing, rales,rhonchi or crepitation. No use  of accessory muscles of respiration.  CARDIOVASCULAR: S1, S2 normal. No murmurs, rubs, or gallops.  ABDOMEN: Soft, non-tender, non-distended. Bowel sounds present. No organomegaly or mass.  EXTREMITIES: No pedal edema, cyanosis, or clubbing.  NEUROLOGIC: Cranial nerves II through XII are intact. Muscle strength 5/5 in all extremities. Sensation intact. Gait not checked.  PSYCHIATRIC: The patient is alert and oriented x 3.  SKIN: No obvious rash, lesion, or ulcer.   DATA REVIEW:   CBC  Recent Labs Lab 06/13/17 0323  WBC 11.6*  HGB 12.0  HCT 35.6  PLT 297    Chemistries   Recent Labs Lab 06/12/17 1207  06/15/17 0635  NA 142  < > 139  K 3.6  < > 3.1*  CL 101  < > 107  CO2 24  < > 27  GLUCOSE 410*  < > 151*  BUN 16  < > 13  CREATININE 0.46  < > <  0.30*  CALCIUM 9.7  < > 8.5*  MG  --   < > 2.1  AST 19  --   --   ALT 19  --   --   ALKPHOS 152*  --   --   BILITOT 1.0  --   --   < > = values in this interval not displayed.  Cardiac Enzymes  Recent Labs Lab 06/13/17 0932  TROPONINI <0.03      Management plans discussed with the patient Through translator and she is in agreement.  CODE STATUS:     Code Status Orders        Start     Ordered   06/12/17 2103  Full code  Continuous     06/12/17 2102    Code Status History    Date Active Date Inactive Code Status Order ID Comments User Context   10/16/2016  9:02 PM 10/19/2016  6:07 PM Full Code [id number]  Loletha Grayer, MD ED   02/14/2016 10:47 PM 02/17/2016  7:07 PM Full Code [id number]  Lance Coon, MD Inpatient      TOTAL TIME TAKING CARE OF THIS PATIENT: 35 minutes.    Loletha Grayer M.D on 06/15/2017 at 6:18 PM  Between 7am to 6pm - Pager - [phone]  After 6pm go to [email or web address] - Proofreader  [employer] [facility]  [phone]  CC: Primary care physician; [facility]

## 2017-06-15 NOTE — Progress Notes (Signed)
Pt. Discharge home with family via private vehicle. Family will transport pt. To medication management to obtain medication translator present to communicate

## 2017-06-15 NOTE — Progress Notes (Signed)
Discharge instruction given to pt. Through translator at bedside. Pt verbalizes understanding. Pt. Application for [facility] filled out and completed at pt. Bedside with assistance of translator. Pt. Will pick up meds from medication management if ride arrives before 1630. Case manager made aware may be a delay in pt. Ride and may need to arrange a way to pick up pt. Meds. PIV removed x1 without difficulty. Awaiting pt. Ride at this time

## 2017-06-15 NOTE — Progress Notes (Signed)
Inpatient Diabetes Program Recommendations  AACE/ADA: New Consensus Statement on Inpatient Glycemic Control (2015)  Target Ranges:  Prepandial:   less than 140 mg/dL      Peak postprandial:   less than 180 mg/dL (1-2 hours)      Critically ill patients:  140 - 180 mg/dL   Results for EMMER, LILLIBRIDGE (MRN [id number]) as of 06/15/2017 10:45  Ref. Range 06/14/2017 07:41 06/14/2017 11:37 06/14/2017 16:29 06/14/2017 20:16 06/15/2017 08:03  Glucose-Capillary Latest Ref Range: 65 - 99 mg/dL 185 (H) 190 (H) 122 (H) 270 (H) 147 (H)   Review of Glycemic Control  Diabetes history: DM2 Outpatient Diabetes medications: NPH 10 units QHS, Novolog 8 units TID with meals, Glumetza 1000 mg BID, Glipizide 10 mg TID with meals Current orders for Inpatient glycemic control: Levemir 8 units BID, Novolog 0-9 units ACHS  Inpatient Diabetes Program Recommendations: HgbA1C: A1C 13.8% on 06/13/17 indicating an average glucose of 349 mg/dl over the past 2-3 months. Outpatient Follow Up:  At time of discharge, recommend prescribing NOVOLIN NPH and NOVOLIN Regular which are both more affordable insulins (versus Levemir and Novolog).  NOTE: Patient states she was going to [facility] but she has not been able to go in about 3 months due to transportation issue and not able to pay $25 copay. Patient will need assistance with follow up and medication management. Noted CM note regarding [facility] and Medication Management. Will need to be prescribed least expensive insulins (NOVOLIN NPH, NOVOLIN Regular, and/or NOVOLIN 70/30) for discharge.  Thanks, Barnie Alderman, RN, MSN, CDE Diabetes Coordinator Inpatient Diabetes Program [phone] (Team Pager from 8am to 5pm)

## 2017-06-15 NOTE — Care Management (Addendum)
RNCM received call from patient's nurse requesting assistance with medications. I have talked with Medication management clinic [facility]) and they have discharge medications available for patient. Patient's ride may be delayed and patient may not be able to get to [facility] before they close at 1630. [facility] will call this RNCM to let me know when meds are available and this RNCM will arrange pharmacy pick up to deliver to patient in this room. Nurse updated that if patient's ride arrives before the medications are filled then if any way possible- patient will need to go to [facility] to pick up these medications- if ride is not here when meds are ready this RNCM will arrange a way to pick up meds for patient.

## 2017-06-15 NOTE — Care Management (Signed)
Message sent to Aundra Millet to let her know that patient is being discharged today. Leola Brazil works with interpreter services, speaks fluent Spanish, and also works for [employer]. My hopes are to get patient connected somehow with assistance in the outpatient world. I did hear back from Langley Adie with Medication Management and if patient is undocumented they will not be able to provide insulin. Diabetes management should be considered with lesser expensive medications if possible to meet this patient's needs.Please contact RNCM with any questions. Unit clerk updated of need to try to connect with jacqui prior to patient's discharge. No other RNCM needs.

## 2017-08-07 ENCOUNTER — Emergency Department: Payer: Self-pay

## 2017-08-07 ENCOUNTER — Emergency Department
Admission: EM | Admit: 2017-08-07 | Discharge: 2017-08-08 | Disposition: A | Discharge status: Home / Self Care | Payer: Self-pay | Attending: Emergency Medicine | Admitting: Emergency Medicine

## 2017-08-07 DIAGNOSIS — E162 Hypoglycemia, unspecified: Secondary | ICD-10-CM

## 2017-08-07 DIAGNOSIS — R110 Nausea: Secondary | ICD-10-CM | POA: Insufficient documentation

## 2017-08-07 DIAGNOSIS — R109 Unspecified abdominal pain: Secondary | ICD-10-CM | POA: Insufficient documentation

## 2017-08-07 DIAGNOSIS — Z794 Long term (current) use of insulin: Secondary | ICD-10-CM | POA: Insufficient documentation

## 2017-08-07 DIAGNOSIS — E876 Hypokalemia: Secondary | ICD-10-CM | POA: Insufficient documentation

## 2017-08-07 DIAGNOSIS — R531 Weakness: Secondary | ICD-10-CM | POA: Insufficient documentation

## 2017-08-07 DIAGNOSIS — Z79899 Other long term (current) drug therapy: Secondary | ICD-10-CM | POA: Insufficient documentation

## 2017-08-07 DIAGNOSIS — E11649 Type 2 diabetes mellitus with hypoglycemia without coma: Secondary | ICD-10-CM | POA: Insufficient documentation

## 2017-08-07 DIAGNOSIS — R079 Chest pain, unspecified: Secondary | ICD-10-CM | POA: Insufficient documentation

## 2017-08-07 DIAGNOSIS — Z7982 Long term (current) use of aspirin: Secondary | ICD-10-CM | POA: Insufficient documentation

## 2017-08-07 LAB — BASIC METABOLIC PANEL
ANION GAP: 9 (ref 5–15)
BUN: 9 mg/dL (ref 6–20)
CALCIUM: 8.5 mg/dL — AB (ref 8.9–10.3)
CO2: 28 mmol/L (ref 22–32)
Chloride: 100 mmol/L — ABNORMAL LOW (ref 101–111)
Creatinine, Ser: 0.4 mg/dL — ABNORMAL LOW (ref 0.44–1.00)
Glucose, Bld: 55 mg/dL — ABNORMAL LOW (ref 65–99)
POTASSIUM: 2.7 mmol/L — AB (ref 3.5–5.1)
Sodium: 137 mmol/L (ref 135–145)

## 2017-08-07 LAB — MAGNESIUM: MAGNESIUM: 1.8 mg/dL (ref 1.7–2.4)

## 2017-08-07 LAB — CBC
HCT: 34.1 % — ABNORMAL LOW (ref 35.0–47.0)
HEMOGLOBIN: 11.9 g/dL — AB (ref 12.0–16.0)
MCH: 28.8 pg (ref 26.0–34.0)
MCHC: 34.9 g/dL (ref 32.0–36.0)
MCV: 82.6 fL (ref 80.0–100.0)
Platelets: 353 10*3/uL (ref 150–440)
RBC: 4.13 MIL/uL (ref 3.80–5.20)
RDW: 14.2 % (ref 11.5–14.5)
WBC: 10.9 10*3/uL (ref 3.6–11.0)

## 2017-08-07 LAB — GLUCOSE, CAPILLARY
GLUCOSE-CAPILLARY: 49 mg/dL — AB (ref 65–99)
Glucose-Capillary: 177 mg/dL — ABNORMAL HIGH (ref 65–99)

## 2017-08-07 LAB — HCG, QUANTITATIVE, PREGNANCY: HCG, BETA CHAIN, QUANT, S: 2 m[IU]/mL (ref <5)

## 2017-08-07 LAB — TROPONIN I

## 2017-08-07 IMAGING — CR DG CHEST 2V
1 series · 3 of 3 positions shown · non-contrast
Comparison: Chest radiograph June 12, 2017

CLINICAL DATA: LEFT chest pain. Nausea and vomiting today. Weakness
and lethargy. History of multiple myeloma.

EXAM:
CHEST  2 VIEW

[Series 1: dg chest 2 view · 0.14mm/px · 3 of 3 slices shown]
[im 1/3]
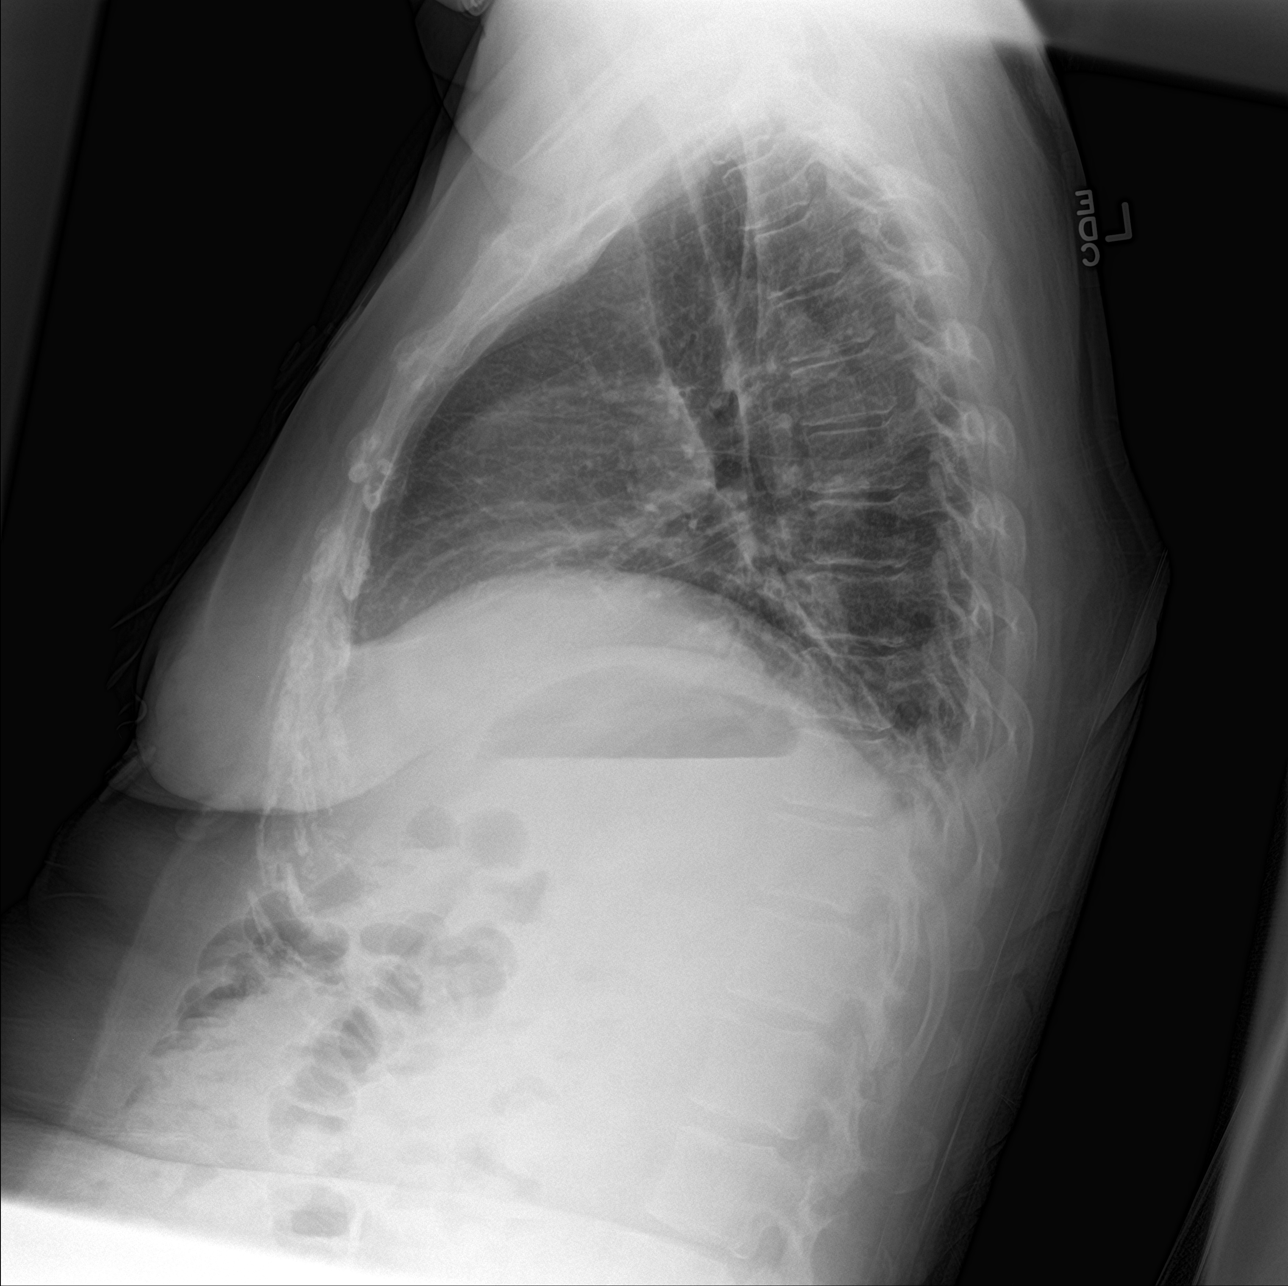
[im 2/3]
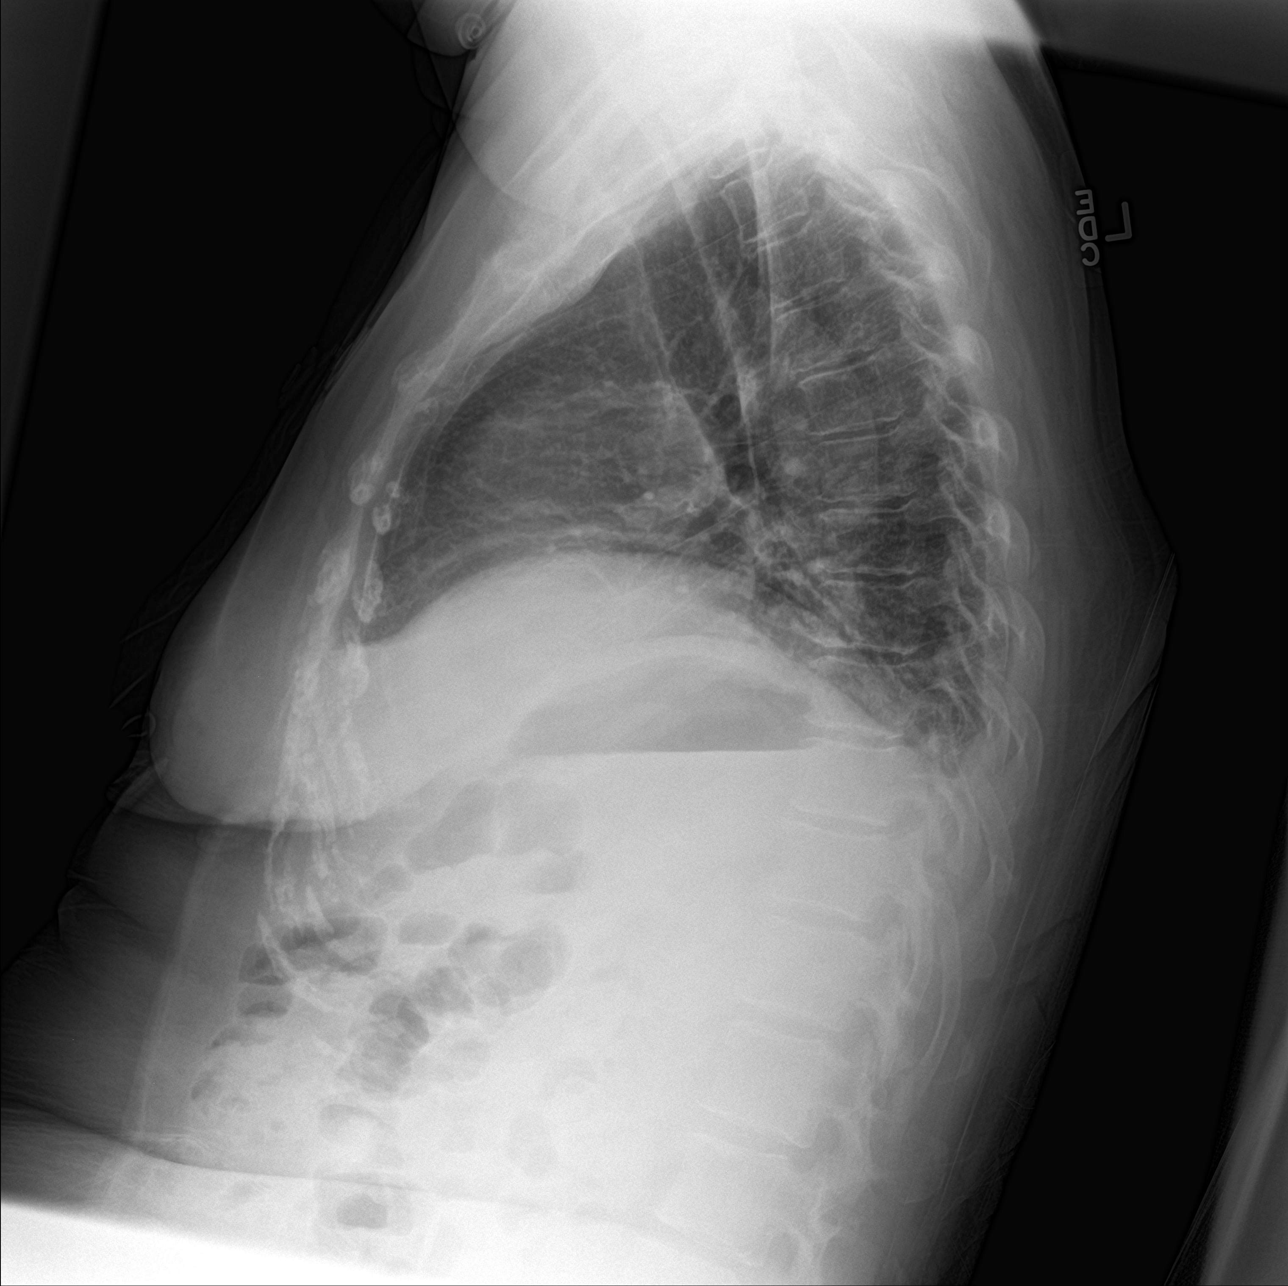
[im 3/3]
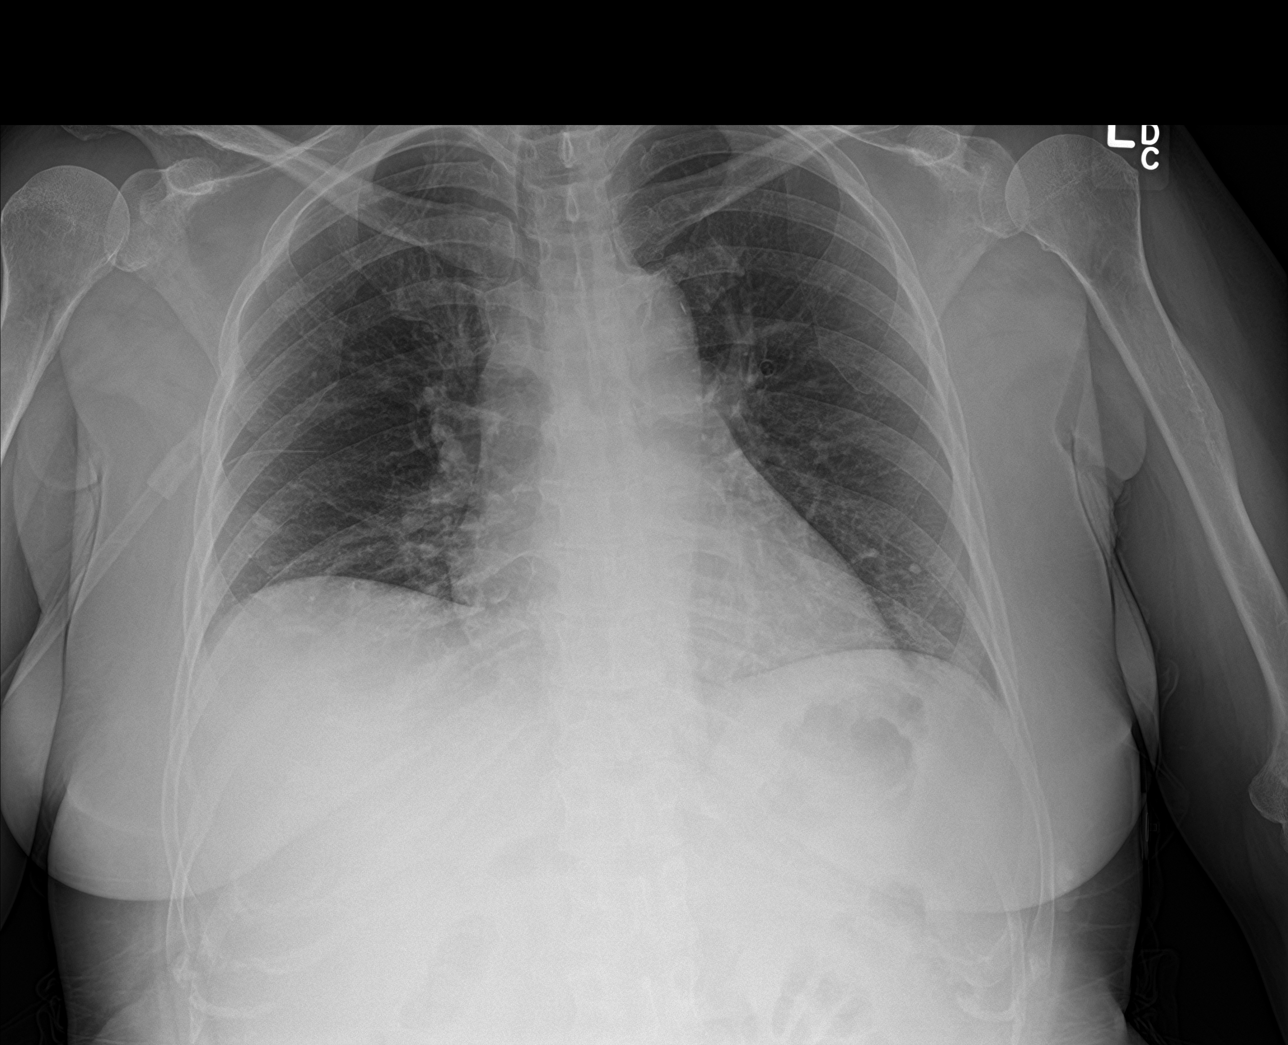

[3 of 3 positions shown; findings below may reference images not displayed]

FINDINGS: Cardiomediastinal silhouette is normal. Trace calcific
atherosclerosis aortic knob. No pleural effusions or focal
consolidations. Strandy densities RIGHT lung base. Mildly elevated
RIGHT hemidiaphragm. Trachea projects midline and there is no
pneumothorax. Soft tissue planes and included osseous structures are
non-suspicious.
IMPRESSION: RIGHT lung base atelectasis.

Aortic Atherosclerosis ([id number]).

## 2017-08-07 MED ORDER — POTASSIUM CHLORIDE 10 MEQ/100ML IV SOLN
10.0000 meq | Freq: Once | INTRAVENOUS | Status: AC
  Administered 2017-08-08: 10 meq via INTRAVENOUS
  Filled 2017-08-07: qty 100

## 2017-08-07 MED ORDER — POTASSIUM CHLORIDE CRYS ER 20 MEQ PO TBCR
40.0000 meq | EXTENDED_RELEASE_TABLET | Freq: Once | ORAL | Status: AC
  Administered 2017-08-07: 40 meq via ORAL
  Filled 2017-08-07: qty 2

## 2017-08-07 MED ORDER — DEXTROSE 50 % IV SOLN
INTRAVENOUS | Status: AC
  Administered 2017-08-07: 50 mL via INTRAVENOUS
  Filled 2017-08-07: qty 50

## 2017-08-07 MED ORDER — DEXTROSE 50 % IV SOLN
1.0000 | Freq: Once | INTRAVENOUS | Status: AC
  Administered 2017-08-07: 50 mL via INTRAVENOUS

## 2017-08-07 NOTE — ED Provider Notes (Signed)
[facility] Emergency Department Provider Note  ____________________________________________  Time seen: Approximately 11:33 PM  I have reviewed the triage vital signs and the nursing notes.   HISTORY  Chief Complaint Weakness; Hypoglycemia; and Chest Pain   HPI Ruth Ruth Walters is a 51 y.o. female with a history of diabetes and multiple myeloma who presents Ruth Walters evaluation of hypoglycemia. Patient was discharged from [facility] yesterday after being admitted Ruth Walters 3 days Ruth Walters fever. She had CT abdomen and pelvis, chest x-ray, blood cultures, and urine cultures which were all negative. Patient was discharged homeon Augmentin Ruth Walters possible community-acquired pneumonia in the setting of cough. The patient reports that she's been feeling very weak, not eating and drinking as much. Compliant with abx. She continued to take her insulin normally (10U of humalog TID, no long acting insulin regimen) even though patient was not eating as much as she usually does. She continues to have cough and mild shortness of breath. She denies chest pain, fever, dysuria, vomiting, diarrhea. She has had nausea. Patient is also complaining of continue sharp abdominal pain which she has Ruth Walters one week identical to pain she had during the admission with negative CT a/p though to be due to gastroparesis. Past Medical History:  Diagnosis Date  . Cancer ([place])    Multiple Myleoma  . Diabetes mellitus without complication ([place])   . Low blood pressure     Patient Active Problem List   Diagnosis Date Noted  . Intractable nausea and vomiting 06/12/2017  . Chest pain 06/12/2017  . Sepsis ([place]) 02/14/2016  . HCAP (healthcare-associated pneumonia) 02/14/2016  . Multiple myeloma ([place]) 02/14/2016  . Type 2 diabetes mellitus ([place]) 02/14/2016    Past Surgical History:  Procedure Laterality Date  . ABDOMINAL HYSTERECTOMY      Prior to Admission medications   Medication Sig Start Date End Date Taking?  Authorizing Provider  amoxicillin-clavulanate (AUGMENTIN) 875-125 MG tablet Take 1 tablet by mouth 2 (two) times daily.   Yes [provider]  aspirin EC 81 MG tablet Take 81 mg by mouth daily. 11/13/14  Yes [provider]  glipiZIDE (GLUCOTROL) 10 MG tablet Take 1 tablet (10 mg total) by mouth 2 (two) times daily before a meal. 06/15/17  Yes Wieting, Richard, MD  Insulin NPH Human, Isophane, (HUMULIN N [place]) Inject 5 Units into the skin 2 (two) times daily.   Yes [provider]  loratadine (CLARITIN) 10 MG tablet Take 10 mg by mouth daily.   Yes [provider]  metFORMIN (GLUMETZA) 1000 MG (MOD) 24 hr tablet Take 1 tablet (1,000 mg total) by mouth daily with breakfast. 06/15/17 02/25/19 Yes Wieting, Richard, MD  Multiple Vitamin (MULTIVITAMIN WITH MINERALS) TABS tablet Take 1 tablet by mouth daily. 06/15/17  Yes Wieting, Richard, MD  polyethylene glycol (MIRALAX / GLYCOLAX) packet Take 17 g by mouth daily as needed.   Yes [provider]  senna (SENOKOT) 8.6 MG TABS tablet Take 2 tablets by mouth at bedtime.   Yes [provider]  pomalidomide (POMALYST) 3 MG capsule Take 3 mg by mouth daily. Take Ruth Walters 21 days. Rest Ruth Walters 7 days. Restart as soon as you get. 02/13/16   [provider]    Allergies Patient has no known allergies.  Family History  Problem Relation Age of Onset  . Leukemia Sister   . Hyperlipidemia Mother     Social History Social History  Substance Use Topics  . Smoking status: Never Smoker  . Smokeless tobacco:  Never Used  . Alcohol use No    Review of Systems  Constitutional: Negative Ruth Walters fever. + Generalized weakness Eyes: Negative Ruth Walters visual changes. ENT: Negative Ruth Walters sore throat. Neck: No neck pain  Cardiovascular: Negative Ruth Walters chest pain. Respiratory: + shortness of breath and cough Gastrointestinal: Negative Ruth Walters abdominal pain, vomiting or diarrhea. + nausea and decreased appetite Genitourinary:  Negative Ruth Walters dysuria. Musculoskeletal: Negative Ruth Walters back pain. Skin: Negative Ruth Walters rash. Neurological: Negative Ruth Walters headaches, weakness or numbness. Psych: No SI or HI  ____________________________________________   PHYSICAL EXAM:  VITAL SIGNS: ED Triage Vitals  Enc Vitals Group     BP 08/07/17 2317 (!) 99/40     Pulse Rate 08/07/17 2317 (!) 105     Resp 08/07/17 2317 18     Temp 08/07/17 2317 98.1 F (36.7 C)     Temp Source 08/07/17 2317 Oral     SpO2 08/07/17 2317 99 %     Weight 08/07/17 2301 130 lb (59 kg)     Height 08/07/17 2301 5' (1.524 m)     Head Circumference --      Peak Flow --      Pain Score 08/07/17 2300 7     Pain Loc --      Pain Edu? --      Excl. in [place]? --     Constitutional: Alert and oriented, no distress.  HEENT:      Head: Normocephalic and atraumatic.         Eyes: Conjunctivae are normal. Sclera is non-icteric.       Mouth/Throat: Mucous membranes are dry.       Neck: Supple with no signs of meningismus. Cardiovascular: Regular rate and rhythm. No murmurs, gallops, or rubs. 2+ symmetrical distal pulses are present in all extremities. No JVD. Respiratory: Normal respiratory effort. Lungs are clear to auscultation bilaterally. No wheezes, crackles, or rhonchi.  Gastrointestinal: Soft, non tender, and non distended with positive bowel sounds. No rebound or guarding. Musculoskeletal: Nontender with normal range of motion in all extremities. No edema, cyanosis, or erythema of extremities. Neurologic: Normal speech and language. Face is symmetric. Moving all extremities. No gross focal neurologic deficits are appreciated. Skin: Skin is warm, dry and intact. No rash noted. Psychiatric: Mood and affect are normal. Speech and behavior are normal.  ____________________________________________   LABS (all labs ordered are listed, but only abnormal results are displayed)  Labs Reviewed  BASIC METABOLIC PANEL - Abnormal; Notable Ruth Walters the following:        Result Value   Potassium 2.7 (*)    Chloride 100 (*)    Glucose, Bld 55 (*)    Creatinine, Ser 0.40 (*)    Calcium 8.5 (*)    All other components within normal limits  CBC - Abnormal; Notable Ruth Walters the following:    Hemoglobin 11.9 (*)    HCT 34.1 (*)    All other components within normal limits  GLUCOSE, CAPILLARY - Abnormal; Notable Ruth Walters the following:    Glucose-Capillary 49 (*)    All other components within normal limits  GLUCOSE, CAPILLARY - Abnormal; Notable Ruth Walters the following:    Glucose-Capillary 177 (*)    All other components within normal limits  HEPATIC FUNCTION PANEL - Abnormal; Notable Ruth Walters the following:    Total Protein 6.2 (*)    Albumin 3.0 (*)    Bilirubin, Direct <0.1 (*)    All other components within normal limits  GLUCOSE, CAPILLARY - Abnormal; Notable Ruth Walters the following:  Glucose-Capillary 152 (*)    All other components within normal limits  GLUCOSE, CAPILLARY - Abnormal; Notable Ruth Walters the following:    Glucose-Capillary 126 (*)    All other components within normal limits  GLUCOSE, CAPILLARY - Abnormal; Notable Ruth Walters the following:    Glucose-Capillary 150 (*)    All other components within normal limits  TROPONIN I  HCG, QUANTITATIVE, PREGNANCY  MAGNESIUM  URINALYSIS, COMPLETE (UACMP) WITH MICROSCOPIC  CBG MONITORING, ED  CBG MONITORING, ED  CBG MONITORING, ED   ____________________________________________  EKG  ED ECG REPORT I, Rudene Re, the attending physician, personally viewed and interpreted this ECG.  Normal sinus rhythm, rate of 99, normal intervals, left axis deviation, diffuse T-wave flattening, no ST elevations or depressions. No changes from prior ____________________________________________  RADIOLOGY  CXR: negative  ____________________________________________   PROCEDURES  Procedure(s) performed: None Procedures Critical Care performed:  None ____________________________________________   INITIAL IMPRESSION /  ASSESSMENT AND PLAN / ED COURSE  51 y.o. female with a history of diabetes and multiple myeloma who presents Ruth Walters evaluation of hypoglycemia in the setting of recent admission Ruth Walters fever, abx use Ruth Walters possible PNA, decrease appetite. Patient received an amp of D50 per EMS Ruth Walters hypoglycemia. Arrived in the emergency room with CBG of 49 and received a second amp. Patient is neurologically intact. We'll check basic blood work due to recent admission to rule out dehydration, electrolyte abnormalities. She continues to complain of cough and mild shortness of breath and therefore we'll repeat chest x-ray. Will monitor CBG  Clinical Course as of Aug 09 203  Mon Aug 08, 2017  0159 BG stable after several hours of obs. Not on ling acting insulin therefore I believe patient is now safe Ruth Walters dc. She had a [place] sandwich here and juice. Vitals stable. Labs with low K which was supplemented PO and IV, otherwise no acute findings. Plan to decreased insulin to 5U BID until patient's appetite is improving and close f/u with her endocrinologist.   [CV]    Clinical Course User Index [CV] Rudene Re, MD    Pertinent labs & imaging results that were available during my care of the patient were reviewed by me and considered in my medical decision making (see chart Ruth Walters details).    ____________________________________________   FINAL CLINICAL IMPRESSION(S) / ED DIAGNOSES  Final diagnoses:  Hypoglycemia  Hypokalemia      NEW MEDICATIONS STARTED DURING THIS VISIT:  New Prescriptions   No medications on file     Note:  This document was prepared using Dragon voice recognition software and may include unintentional dictation errors.    Rudene Re, MD 08/08/17 [phone]

## 2017-08-07 NOTE — ED Notes (Signed)
cbg 49

## 2017-08-07 NOTE — ED Notes (Signed)
Dr. Corky Downs notified of pt's CBG being 29 and pt's altered mental status. VORB received for 1amp D50.

## 2017-08-07 NOTE — ED Triage Notes (Signed)
Pt arrived via ems for c/o left sided chest pain with nausea and vomiting all day - c/o weakness and lethargy - pt speaks no english

## 2017-08-08 LAB — HEPATIC FUNCTION PANEL
ALBUMIN: 3 g/dL — AB (ref 3.5–5.0)
ALT: 27 U/L (ref 14–54)
AST: 27 U/L (ref 15–41)
Alkaline Phosphatase: 106 U/L (ref 38–126)
BILIRUBIN TOTAL: 0.4 mg/dL (ref 0.3–1.2)
Total Protein: 6.2 g/dL — ABNORMAL LOW (ref 6.5–8.1)

## 2017-08-08 LAB — GLUCOSE, CAPILLARY
GLUCOSE-CAPILLARY: 150 mg/dL — AB (ref 65–99)
GLUCOSE-CAPILLARY: 155 mg/dL — AB (ref 65–99)
Glucose-Capillary: 126 mg/dL — ABNORMAL HIGH (ref 65–99)
Glucose-Capillary: 152 mg/dL — ABNORMAL HIGH (ref 65–99)

## 2017-08-08 NOTE — Discharge Instructions (Signed)
°  Reduzca su rgimen de insulina a [employer] veces al da hasta que su apetito vuelva a la normalidad. Verifique su nivel de azcar al menos tres veces al da y si sube> 200 puede tomar la dosis completa de 10U. Haga un seguimiento con su mdico en 1-2 das. Continuar con los antibiticos prescritos por [employer].  Reduce your insulin regimen to 5U twice a day until your appetite is back to normal. Check your sugar level at least three times a day and if going up > 200 you may take the full 10U dose. Follow up with your doctor in 1-2 days. Continue the antibiotics prescribed by [facility].

## 2017-08-08 NOTE — ED Notes (Signed)
Patient given a sandwich tray.

## 2017-08-08 NOTE — ED Notes (Signed)
ED Provider at bedside. 

## 2017-08-08 NOTE — ED Notes (Signed)
Pt gave verbal consent to sign E-Signature because pad not working.

## 2017-08-08 NOTE — ED Notes (Signed)
BS 155

## 2018-04-27 ENCOUNTER — Emergency Department: Payer: Self-pay

## 2018-04-27 ENCOUNTER — Other Ambulatory Visit: Payer: Self-pay

## 2018-04-27 ENCOUNTER — Inpatient Hospital Stay
Admission: EM | Admit: 2018-04-27 | Discharge: 2018-04-30 | DRG: 872 | Disposition: A | Discharge status: Home / Self Care | Payer: Self-pay | Attending: Internal Medicine | Admitting: Internal Medicine

## 2018-04-27 DIAGNOSIS — R531 Weakness: Secondary | ICD-10-CM | POA: Diagnosis present

## 2018-04-27 DIAGNOSIS — E119 Type 2 diabetes mellitus without complications: Secondary | ICD-10-CM | POA: Diagnosis present

## 2018-04-27 DIAGNOSIS — I959 Hypotension, unspecified: Secondary | ICD-10-CM | POA: Diagnosis present

## 2018-04-27 DIAGNOSIS — A419 Sepsis, unspecified organism: Secondary | ICD-10-CM

## 2018-04-27 DIAGNOSIS — Z7982 Long term (current) use of aspirin: Secondary | ICD-10-CM

## 2018-04-27 DIAGNOSIS — Z794 Long term (current) use of insulin: Secondary | ICD-10-CM

## 2018-04-27 DIAGNOSIS — C9000 Multiple myeloma not having achieved remission: Secondary | ICD-10-CM | POA: Diagnosis present

## 2018-04-27 DIAGNOSIS — E871 Hypo-osmolality and hyponatremia: Secondary | ICD-10-CM | POA: Diagnosis present

## 2018-04-27 DIAGNOSIS — Z79899 Other long term (current) drug therapy: Secondary | ICD-10-CM

## 2018-04-27 DIAGNOSIS — N390 Urinary tract infection, site not specified: Secondary | ICD-10-CM | POA: Diagnosis present

## 2018-04-27 DIAGNOSIS — A4151 Sepsis due to Escherichia coli [E. coli]: Principal | ICD-10-CM | POA: Diagnosis present

## 2018-04-27 DIAGNOSIS — I9589 Other hypotension: Secondary | ICD-10-CM | POA: Diagnosis present

## 2018-04-27 LAB — COMPREHENSIVE METABOLIC PANEL
ALK PHOS: 129 U/L — AB (ref 38–126)
ALT: 22 U/L (ref 14–54)
AST: 17 U/L (ref 15–41)
Albumin: 3.2 g/dL — ABNORMAL LOW (ref 3.5–5.0)
Anion gap: 8 (ref 5–15)
BUN: 27 mg/dL — ABNORMAL HIGH (ref 6–20)
CALCIUM: 8.2 mg/dL — AB (ref 8.9–10.3)
CO2: 25 mmol/L (ref 22–32)
CREATININE: 0.65 mg/dL (ref 0.44–1.00)
Chloride: 100 mmol/L — ABNORMAL LOW (ref 101–111)
GFR calc non Af Amer: >60 mL/min (ref >60)
Glucose, Bld: 257 mg/dL — ABNORMAL HIGH (ref 65–99)
Potassium: 4.3 mmol/L (ref 3.5–5.1)
SODIUM: 133 mmol/L — AB (ref 135–145)
Total Bilirubin: 0.6 mg/dL (ref 0.3–1.2)
Total Protein: 6.6 g/dL (ref 6.5–8.1)

## 2018-04-27 LAB — CBC WITH DIFFERENTIAL/PLATELET
Basophils Absolute: 0 10*3/uL (ref 0–0.1)
Basophils Relative: 0 %
EOS ABS: 0 10*3/uL (ref 0–0.7)
EOS PCT: 0 %
HCT: 28.4 % — ABNORMAL LOW (ref 35.0–47.0)
Hemoglobin: 9.7 g/dL — ABNORMAL LOW (ref 12.0–16.0)
LYMPHS ABS: 1.3 10*3/uL (ref 1.0–3.6)
Lymphocytes Relative: 8 %
MCH: 27.8 pg (ref 26.0–34.0)
MCHC: 34.1 g/dL (ref 32.0–36.0)
MCV: 81.7 fL (ref 80.0–100.0)
MONOS PCT: 7 %
Monocytes Absolute: 1.2 10*3/uL — ABNORMAL HIGH (ref 0.2–0.9)
Neutro Abs: 14.7 10*3/uL — ABNORMAL HIGH (ref 1.4–6.5)
Neutrophils Relative %: 85 %
PLATELETS: 353 10*3/uL (ref 150–440)
RBC: 3.48 MIL/uL — AB (ref 3.80–5.20)
RDW: 15 % — AB (ref 11.5–14.5)
WBC: 17.2 10*3/uL — AB (ref 3.6–11.0)

## 2018-04-27 LAB — URINALYSIS, COMPLETE (UACMP) WITH MICROSCOPIC
BILIRUBIN URINE: NEGATIVE
Glucose, UA: 50 mg/dL — AB
Hgb urine dipstick: NEGATIVE
Ketones, ur: NEGATIVE mg/dL
Nitrite: NEGATIVE
PROTEIN: 100 mg/dL — AB
SPECIFIC GRAVITY, URINE: 1.014 (ref 1.005–1.030)
WBC, UA: >50 WBC/hpf — ABNORMAL HIGH (ref 0–5)
pH: 5 (ref 5.0–8.0)

## 2018-04-27 LAB — GLUCOSE, CAPILLARY: Glucose-Capillary: 224 mg/dL — ABNORMAL HIGH (ref 65–99)

## 2018-04-27 LAB — LACTIC ACID, PLASMA
Lactic Acid, Venous: 0.8 mmol/L (ref 0.5–1.9)
Lactic Acid, Venous: 0.6 mmol/L (ref 0.5–1.9)

## 2018-04-27 LAB — TROPONIN I: Troponin I: <0.03 ng/mL (ref <0.03)

## 2018-04-27 IMAGING — DX DG CHEST 1V PORT
1 series · 1 of 1 positions shown · non-contrast
Comparison: 08/07/2017.

CLINICAL DATA: Generalized weakness, with fever.

EXAM:
PORTABLE CHEST 1 VIEW

[chest ap]
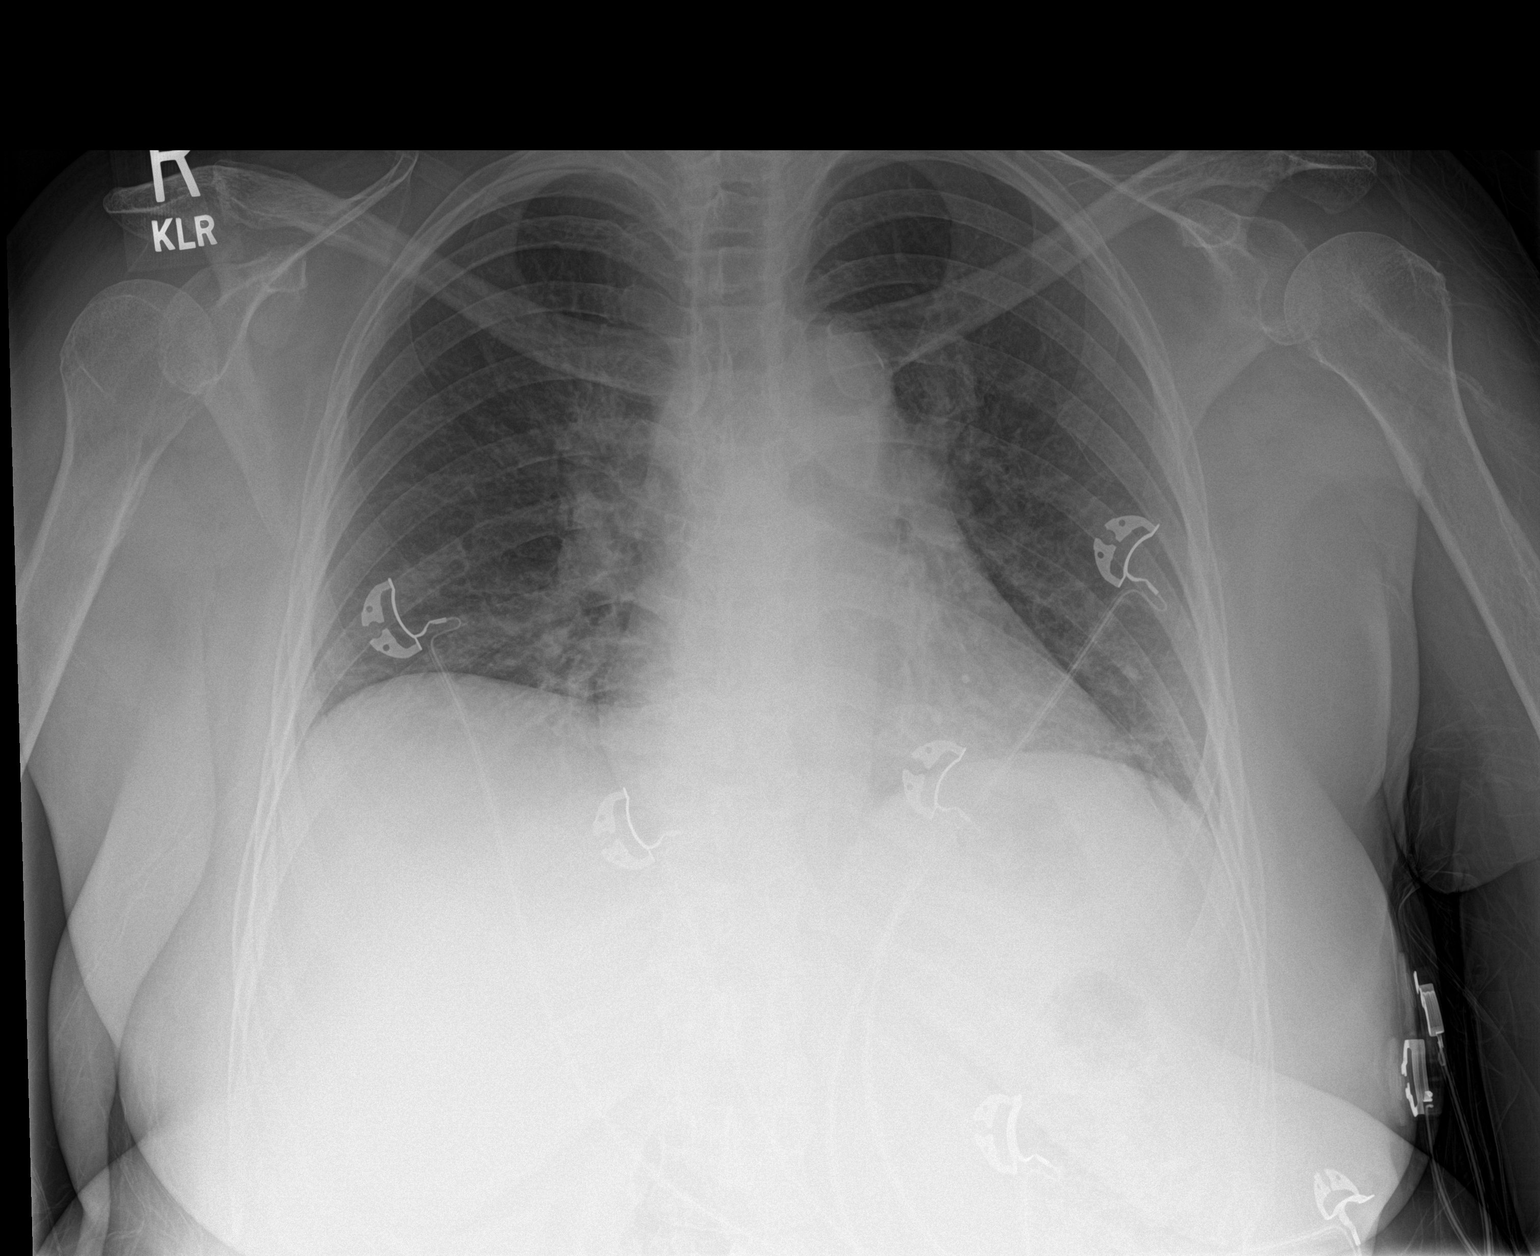

[1 of 1 positions shown; findings below may reference images not displayed]

FINDINGS: The heart is enlarged. Bibasilar subsegmental atelectasis. No
consolidation or edema. No effusion or pneumothorax. Bones
unremarkable.
IMPRESSION: Stable chest.

## 2018-04-27 MED ORDER — ASPIRIN EC 81 MG PO TBEC
81.0000 mg | DELAYED_RELEASE_TABLET | Freq: Every day | ORAL | Status: DC
  Administered 2018-04-28 – 2018-04-30 (×3): 81 mg via ORAL
  Filled 2018-04-27 (×3): qty 1

## 2018-04-27 MED ORDER — ONDANSETRON HCL 4 MG/2ML IJ SOLN: 4.0000 mg | Freq: Four times a day (QID) | INTRAMUSCULAR | Status: DC | PRN

## 2018-04-27 MED ORDER — ACETAMINOPHEN 650 MG RE SUPP: 650.0000 mg | Freq: Four times a day (QID) | RECTAL | Status: DC | PRN

## 2018-04-27 MED ORDER — SODIUM CHLORIDE 0.9 % IV SOLN
1.0000 g | INTRAVENOUS | Status: DC
  Filled 2018-04-27: qty 10

## 2018-04-27 MED ORDER — SODIUM CHLORIDE 0.9 % IV BOLUS
1000.0000 mL | Freq: Once | INTRAVENOUS | Status: AC
  Administered 2018-04-27: 1000 mL via INTRAVENOUS

## 2018-04-27 MED ORDER — ACETAMINOPHEN 325 MG PO TABS
650.0000 mg | ORAL_TABLET | Freq: Once | ORAL | Status: AC
  Administered 2018-04-27: 650 mg via ORAL
  Filled 2018-04-27: qty 2

## 2018-04-27 MED ORDER — PIPERACILLIN-TAZOBACTAM 3.375 G IVPB 30 MIN
3.3750 g | Freq: Once | INTRAVENOUS | Status: AC
  Administered 2018-04-27: 3.375 g via INTRAVENOUS
  Filled 2018-04-27: qty 50

## 2018-04-27 MED ORDER — SODIUM CHLORIDE 0.9 % IV SOLN
INTRAVENOUS | Status: DC
  Administered 2018-04-27 – 2018-04-28 (×2): via INTRAVENOUS

## 2018-04-27 MED ORDER — ONDANSETRON HCL 4 MG PO TABS: 4.0000 mg | ORAL_TABLET | Freq: Four times a day (QID) | ORAL | Status: DC | PRN

## 2018-04-27 MED ORDER — LORATADINE 10 MG PO TABS
10.0000 mg | ORAL_TABLET | Freq: Every day | ORAL | Status: DC
  Administered 2018-04-28 – 2018-04-30 (×3): 10 mg via ORAL
  Filled 2018-04-27 (×3): qty 1

## 2018-04-27 MED ORDER — ACETAMINOPHEN 325 MG PO TABS
650.0000 mg | ORAL_TABLET | Freq: Four times a day (QID) | ORAL | Status: DC | PRN
Start: 2018-04-27 — End: 2018-04-30
  Administered 2018-04-28: 650 mg via ORAL
  Filled 2018-04-27: qty 2

## 2018-04-27 MED ORDER — ENOXAPARIN SODIUM 40 MG/0.4ML ~~LOC~~ SOLN
40.0000 mg | SUBCUTANEOUS | Status: DC
  Administered 2018-04-28 – 2018-04-29 (×3): 40 mg via SUBCUTANEOUS
  Filled 2018-04-27 (×3): qty 0.4

## 2018-04-27 MED ORDER — VANCOMYCIN HCL IN DEXTROSE 1-5 GM/200ML-% IV SOLN
1000.0000 mg | Freq: Once | INTRAVENOUS | Status: AC
  Administered 2018-04-27: 1000 mg via INTRAVENOUS
  Filled 2018-04-27: qty 200

## 2018-04-27 MED ORDER — SENNA 8.6 MG PO TABS
2.0000 | ORAL_TABLET | Freq: Every day | ORAL | Status: DC
  Administered 2018-04-28 – 2018-04-29 (×2): 17.2 mg via ORAL
  Filled 2018-04-27 (×3): qty 2

## 2018-04-27 MED ORDER — PIPERACILLIN-TAZOBACTAM 3.375 G IVPB
3.3750 g | Freq: Three times a day (TID) | INTRAVENOUS | Status: DC
  Administered 2018-04-28 – 2018-04-30 (×7): 3.375 g via INTRAVENOUS
  Filled 2018-04-27 (×6): qty 50

## 2018-04-27 MED ORDER — POLYETHYLENE GLYCOL 3350 17 G PO PACK
17.0000 g | PACK | Freq: Every day | ORAL | Status: DC
  Administered 2018-04-28: 17 g via ORAL
  Filled 2018-04-27: qty 1

## 2018-04-27 MED ORDER — HYDROCODONE-ACETAMINOPHEN 5-325 MG PO TABS
1.0000 | ORAL_TABLET | ORAL | Status: DC | PRN
  Administered 2018-04-28 – 2018-04-29 (×3): 2 via ORAL
  Filled 2018-04-27 (×3): qty 2

## 2018-04-27 MED ORDER — ADULT MULTIVITAMIN W/MINERALS CH
1.0000 | ORAL_TABLET | Freq: Every day | ORAL | Status: DC
  Administered 2018-04-28 – 2018-04-30 (×3): 1 via ORAL
  Filled 2018-04-27 (×3): qty 1

## 2018-04-27 NOTE — Progress Notes (Signed)
Pharmacy Antibiotic Note  Ruth Walters is a 52 y.o. female admitted on 04/27/2018 with sepsis.  Pharmacy has been consulted for zosyn dosing.  Plan: Zosyn 3.375g IV q8h (4 hour infusion).  Height: 5\' 2"  (157.5 cm) Weight: 149 lb 9.6 oz (67.9 kg) IBW/kg (Calculated) : 50.1  Temp (24hrs), Avg:99.2 F (37.3 C), Min:97.7 F (36.5 C), Max:100.6 F (38.1 C)  Recent Labs  Lab 04/27/18 1742 04/27/18 2032  WBC 17.2*  --   CREATININE 0.65  --   LATICACIDVEN 0.8 0.6    Estimated Creatinine Clearance: 75.1 mL/min (by C-G formula based on SCr of 0.65 mg/dL).    No Known Allergies   Thank you for allowing pharmacy to be a part of this patient's care.  Tobie Lords, PharmD, BCPS Clinical Pharmacist 04/27/2018

## 2018-04-27 NOTE — ED Notes (Signed)
Called floor to give report. RN wants to call back.

## 2018-04-27 NOTE — Progress Notes (Signed)
Pharmacy Antibiotic Note  Ruth Walters is a 52 y.o. female admitted on 04/27/2018 with UTI.  Pharmacy has been consulted for Ceftriaxone dosing.  Plan: Ceftriaxone 1g IV q24h  Height: 5\' 2"  (157.5 cm) Weight: 130 lb (59 kg) IBW/kg (Calculated) : 50.1  Temp (24hrs), Avg:100.6 F (38.1 C), Min:100.6 F (38.1 C), Max:100.6 F (38.1 C)  Recent Labs  Lab 04/27/18 1742  WBC 17.2*  CREATININE 0.65  LATICACIDVEN 0.8    Estimated Creatinine Clearance: 65.8 mL/min (by C-G formula based on SCr of 0.65 mg/dL).    No Known Allergies   Thank you for allowing pharmacy to be a part of this patient's care.  Paulina Fusi, PharmD, BCPS 04/27/2018 8:33 PM

## 2018-04-27 NOTE — ED Notes (Signed)
Placed pt on 2 L nasal cannula for comfort 

## 2018-04-27 NOTE — ED Notes (Signed)
Kala RN, aware of bed assigned  

## 2018-04-27 NOTE — ED Provider Notes (Signed)
[facility] Emergency Department Provider Note ____________________________________________   First MD Initiated Contact with Patient 04/27/18 1741     (approximate)  I have reviewed the triage vital signs and the nursing notes.   HISTORY  Chief Complaint Weakness  HPI obtained via [place] interpreter in person  HPI Ruth Walters is a 52 y.o. female with PMH as noted below including multiple myeloma (for which she last received chemotherapy about 3 to 4 weeks ago) who presents with generalized weakness over the last day, gradual onset, associated with fever, as well as with dysuria, shortness of breath, and headache.  Patient denies vomiting or diarrhea.   Past Medical History:  Diagnosis Date  . Cancer ([place])    Multiple Myleoma  . Diabetes mellitus without complication ([place])   . Low blood pressure     Patient Active Problem List   Diagnosis Date Noted  . Intractable nausea and vomiting 06/12/2017  . Chest pain 06/12/2017  . Sepsis ([place]) 02/14/2016  . HCAP (healthcare-associated pneumonia) 02/14/2016  . Multiple myeloma ([place]) 02/14/2016  . Type 2 diabetes mellitus ([place]) 02/14/2016    Past Surgical History:  Procedure Laterality Date  . ABDOMINAL HYSTERECTOMY      Prior to Admission medications   Medication Sig Start Date End Date Taking? Authorizing Provider  aspirin EC 81 MG tablet Take 81 mg by mouth daily. 11/13/14  Yes [provider]  insulin lispro (HUMALOG KWIKPEN) 100 UNIT/ML KiwkPen Inject 5 Units into the skin 3 (three) times daily. 04/06/18 07/05/18 Yes [provider]  loratadine (CLARITIN) 10 MG tablet Take 10 mg by mouth daily.   Yes [provider]  metFORMIN (GLUMETZA) 1000 MG (MOD) 24 hr tablet Take 1 tablet (1,000 mg total) by mouth daily with breakfast. Patient taking differently: Take 1,000 mg by mouth 2 (two) times daily with a meal.  06/15/17 02/25/19 Yes Wieting, Richard, MD  Multiple Vitamin  (MULTIVITAMIN WITH MINERALS) TABS tablet Take 1 tablet by mouth daily. 06/15/17  Yes Wieting, Richard, MD  polyethylene glycol (MIRALAX / GLYCOLAX) packet Take 17 g by mouth daily as needed.   Yes [provider]  senna (SENOKOT) 8.6 MG TABS tablet Take 2 tablets by mouth at bedtime.   Yes [provider]  glipiZIDE (GLUCOTROL) 10 MG tablet Take 1 tablet (10 mg total) by mouth 2 (two) times daily before a meal. Patient not taking: Reported on 04/27/2018 06/15/17   Loletha Grayer, MD    Allergies Patient has no known allergies.  Family History  Problem Relation Age of Onset  . Leukemia Sister   . Hyperlipidemia Mother     Social History Social History   Tobacco Use  . Smoking status: Never Smoker  . Smokeless tobacco: Never Used  Substance Use Topics  . Alcohol use: No    Alcohol/week: 0.0 oz  . Drug use: No    Review of Systems  Constitutional: Positive for fever. Eyes: No redness. ENT: No sore throat. Cardiovascular: Denies chest pain. Respiratory: Positive for shortness of breath. Gastrointestinal: No vomiting.  Genitourinary: Positive for dysuria.  Musculoskeletal: Positive for back pain. Skin: Negative for rash. Neurological: Positive for headache.   ____________________________________________   PHYSICAL EXAM:  VITAL SIGNS: ED Triage Vitals  Enc Vitals Group     BP 04/27/18 1732 (!) 102/55     Pulse Rate 04/27/18 1732 (!) 101     Resp 04/27/18 1732 17     Temp 04/27/18 1732 (!) 100.6 F (38.1 C)  Temp Source 04/27/18 1732 Oral     SpO2 04/27/18 1732 91 %     Weight 04/27/18 1733 130 lb (59 kg)     Height 04/27/18 1733 5' 2"  (1.575 m)     Head Circumference --      Peak Flow --      Pain Score --      Pain Loc --      Pain Edu? --      Excl. in [place]? --     Constitutional: Alert and oriented.  Slightly uncomfortable appearing but in no acute distress. Eyes: Conjunctivae are normal.  EOMI.  PERRLA.  No photophobia. Head:  Atraumatic. Nose: No congestion/rhinnorhea. Mouth/Throat: Mucous membranes are dry.   Neck: Normal range of motion.  No meningeal signs. Cardiovascular: Normal rate, regular rhythm. Grossly normal heart sounds.  Good peripheral circulation. Respiratory: Normal respiratory effort.  No retractions. Lungs CTAB. Gastrointestinal: Soft and nontender. No distention.  Genitourinary: No flank tenderness. Musculoskeletal: No lower extremity edema.  Extremities warm and well perfused.  Neurologic:  Normal speech and language. No gross focal neurologic deficits are appreciated.  Skin:  Skin is warm and dry. No rash noted. Psychiatric: Mood and affect are normal. Speech and behavior are normal.  ____________________________________________   LABS (all labs ordered are listed, but only abnormal results are displayed)  Labs Reviewed  COMPREHENSIVE METABOLIC PANEL - Abnormal; Notable for the following components:      Result Value   Sodium 133 (*)    Chloride 100 (*)    Glucose, Bld 257 (*)    BUN 27 (*)    Calcium 8.2 (*)    Albumin 3.2 (*)    Alkaline Phosphatase 129 (*)    All other components within normal limits  CBC WITH DIFFERENTIAL/PLATELET - Abnormal; Notable for the following components:   WBC 17.2 (*)    RBC 3.48 (*)    Hemoglobin 9.7 (*)    HCT 28.4 (*)    RDW 15.0 (*)    Neutro Abs 14.7 (*)    Monocytes Absolute 1.2 (*)    All other components within normal limits  URINALYSIS, COMPLETE (UACMP) WITH MICROSCOPIC - Abnormal; Notable for the following components:   Color, Urine YELLOW (*)    APPearance CLOUDY (*)    Glucose, UA 50 (*)    Protein, ur 100 (*)    Leukocytes, UA LARGE (*)    WBC, UA >50 (*)    Bacteria, UA MANY (*)    All other components within normal limits  CULTURE, BLOOD (ROUTINE X 2)  CULTURE, BLOOD (ROUTINE X 2)  URINE CULTURE  LACTIC ACID, PLASMA  TROPONIN I  LACTIC ACID, PLASMA   ____________________________________________  EKG  ED ECG  REPORT I, Arta Silence, the attending physician, personally viewed and interpreted this ECG.  Date: 04/27/2018 EKG Time: 1735 Rate: 98 Rhythm: normal sinus rhythm QRS Axis: Left axis deviation Intervals: normal ST/T Wave abnormalities: normal Narrative Interpretation: no evidence of acute ischemia  ____________________________________________  RADIOLOGY  CXR: No focal infiltrate or other acute abnormality  ____________________________________________   PROCEDURES  Procedure(s) performed: No  Procedures  Critical Care performed: Yes  CRITICAL CARE Performed by: Arta Silence   Total critical care time: 30 minutes  Critical care time was exclusive of separately billable procedures and treating other patients.  Critical care was necessary to treat or prevent imminent or life-threatening deterioration.  Critical care was time spent personally by me on the following activities: development of treatment  plan with patient and/or surrogate as well as nursing, discussions with consultants, evaluation of patient's response to treatment, examination of patient, obtaining history from patient or surrogate, ordering and performing treatments and interventions, ordering and review of laboratory studies, ordering and review of radiographic studies, pulse oximetry and re-evaluation of patient's condition. ____________________________________________   INITIAL IMPRESSION / ASSESSMENT AND PLAN / ED COURSE  Pertinent labs & imaging results that were available during my care of the patient were reviewed by me and considered in my medical decision making (see chart for details).  52 year old female with PMH as noted above presents with generalized weakness, fever, and some shortness of breath and dysuria over the last day.  I reviewed the past medical records in Epic; the patient was most recently seen in the ED last year for hypoglycemia, and before that for dehydration and  tachycardia but has had no recent admissions.  On exam, the patient is weak appearing but in no acute distress.  She is febrile and was noted to be hypotensive on repeat vital signs.  Presentation is consistent with sepsis, most likely UTI versus pneumonia.  Plan: Sepsis work-up, IV fluids, empiric antibiotics, and reassess.  ----------------------------------------- 7:01 PM on 04/27/2018 -----------------------------------------  Work-up is consistent with UTI.  Broad-spectrum antibiotics have been given.  Patient's BP is improving.  Plan for admission.    ----------------------------------------- 7:42 PM on 04/27/2018 -----------------------------------------  I signed the patient out to the hospitalist Dr. Estanislado Pandy for admission.  ____________________________________________   FINAL CLINICAL IMPRESSION(S) / ED DIAGNOSES  Final diagnoses:  Urinary tract infection without hematuria, site unspecified  Sepsis, due to unspecified organism [facility])      NEW MEDICATIONS STARTED DURING THIS VISIT:  New Prescriptions   No medications on file     Note:  This document was prepared using Dragon voice recognition software and may include unintentional dictation errors.    Arta Silence, MD 04/27/18 1943

## 2018-04-27 NOTE — H&P (Signed)
[facility] [place] NAME: Ruth Walters    MR#:  [id number]  DATE OF BIRTH:  [birth year]  DATE OF ADMISSION:  04/27/2018  PRIMARY CARE PHYSICIAN: System, Pcp Not In   REQUESTING/REFERRING PHYSICIAN:   CHIEF COMPLAINT:   Chief Complaint  Patient presents with  . Weakness    HISTORY OF PRESENT ILLNESS: [name]  is a 52 y.o. female with a known history of multiple myeloma, diabetes mellitus type 2 presented to the emergency room with generalized weakness.  Patient also had fever and burning sensation when she passes urine.  Has foul-smelling urine and generalized body aches.  She was found to be hypotensive when she came to ER and she was given IV fluids and blood pressure responded.  She was given broad-spectrum IV antibiotics initially considering sepsis as a possible differential diagnosis.  Lactic acid was normal.  No complaints of any chest pain.  No recent travel, sick contacts at home.  Hospitalist service was consulted for further care.  PAST MEDICAL HISTORY:   Past Medical History:  Diagnosis Date  . Cancer ([place])    Multiple Myleoma  . Diabetes mellitus without complication ([place])   . Low blood pressure     PAST SURGICAL HISTORY:  Past Surgical History:  Procedure Laterality Date  . ABDOMINAL HYSTERECTOMY      SOCIAL HISTORY:  Social History   Tobacco Use  . Smoking status: Never Smoker  . Smokeless tobacco: Never Used  Substance Use Topics  . Alcohol use: No    Alcohol/week: 0.0 oz    FAMILY HISTORY:  Family History  Problem Relation Age of Onset  . Leukemia Sister   . Hyperlipidemia Mother     DRUG ALLERGIES: No Known Allergies  REVIEW OF SYSTEMS:   CONSTITUTIONAL: Has fever, fatigue and weakness.  EYES: No blurred or double vision.  EARS, NOSE, AND THROAT: No tinnitus or ear pain.  RESPIRATORY: No cough, shortness of breath, wheezing or hemoptysis.  CARDIOVASCULAR: No chest pain,  orthopnea, edema.  GASTROINTESTINAL: No nausea, vomiting, diarrhea or abdominal pain.  GENITOURINARY: Has dysuria, no hematuria.  ENDOCRINE: No polyuria, nocturia,  HEMATOLOGY: No anemia, easy bruising or bleeding SKIN: No rash or lesion. MUSCULOSKELETAL: No joint pain or arthritis.   NEUROLOGIC: No tingling, numbness, weakness.  PSYCHIATRY: No anxiety or depression.   MEDICATIONS AT HOME:  Prior to Admission medications   Medication Sig Start Date End Date Taking? Authorizing Provider  aspirin EC 81 MG tablet Take 81 mg by mouth daily. 11/13/14  Yes [provider]  insulin lispro (HUMALOG KWIKPEN) 100 UNIT/ML KiwkPen Inject 5 Units into the skin 3 (three) times daily. 04/06/18 07/05/18 Yes [provider]  loratadine (CLARITIN) 10 MG tablet Take 10 mg by mouth daily.   Yes [provider]  metFORMIN (GLUMETZA) 1000 MG (MOD) 24 hr tablet Take 1 tablet (1,000 mg total) by mouth daily with breakfast. Patient taking differently: Take 1,000 mg by mouth 2 (two) times daily with a meal.  06/15/17 02/25/19 Yes Wieting, Richard, MD  Multiple Vitamin (MULTIVITAMIN WITH MINERALS) TABS tablet Take 1 tablet by mouth daily. 06/15/17  Yes Wieting, Richard, MD  polyethylene glycol (MIRALAX / GLYCOLAX) packet Take 17 g by mouth daily as needed.   Yes [provider]  senna (SENOKOT) 8.6 MG TABS tablet Take 2 tablets by mouth at bedtime.   Yes [provider]  glipiZIDE (GLUCOTROL) 10 MG tablet Take 1 tablet (10 mg total)  by mouth 2 (two) times daily before a meal. Patient not taking: Reported on 04/27/2018 06/15/17   Loletha Grayer, MD      PHYSICAL EXAMINATION:   VITAL SIGNS: Blood pressure (!) 92/53, pulse 82, temperature (!) 100.6 F (38.1 C), temperature source Oral, resp. rate 16, height '5\' 2"'$  (1.575 m), weight 59 kg (130 lb), SpO2 99 %.  GENERAL:  52 y.o.-year-old patient lying in the bed with no acute distress.  EYES: Pupils equal, round, reactive to  light and accommodation. No scleral icterus. Extraocular muscles intact.  HEENT: Head atraumatic, normocephalic. Oropharynx dry and nasopharynx clear.  NECK:  Supple, no jugular venous distention. No thyroid enlargement, no tenderness.  LUNGS: Normal breath sounds bilaterally, no wheezing, rales,rhonchi or crepitation. No use of accessory muscles of respiration.  CARDIOVASCULAR: S1, S2 normal. No murmurs, rubs, or gallops.  ABDOMEN: Soft, mild tenderness lower abdomen, nondistended. Bowel sounds present. No organomegaly or mass.  EXTREMITIES: No pedal edema, cyanosis, or clubbing.  NEUROLOGIC: Cranial nerves II through XII are intact. Muscle strength 5/5 in all extremities. Sensation intact. Gait not checked.  PSYCHIATRIC: The patient is alert and oriented x 3.  SKIN: No obvious rash, lesion, or ulcer.   LABORATORY PANEL:   CBC Recent Labs  Lab 04/27/18 1742  WBC 17.2*  HGB 9.7*  HCT 28.4*  PLT 353  MCV 81.7  MCH 27.8  MCHC 34.1  RDW 15.0*  LYMPHSABS 1.3  MONOABS 1.2*  EOSABS 0.0  BASOSABS 0.0   ------------------------------------------------------------------------------------------------------------------  Chemistries  Recent Labs  Lab 04/27/18 1742  NA 133*  K 4.3  CL 100*  CO2 25  GLUCOSE 257*  BUN 27*  CREATININE 0.65  CALCIUM 8.2*  AST 17  ALT 22  ALKPHOS 129*  BILITOT 0.6   ------------------------------------------------------------------------------------------------------------------ estimated creatinine clearance is 65.8 mL/min (by C-G formula based on SCr of 0.65 mg/dL). ------------------------------------------------------------------------------------------------------------------ No results for input(s): TSH, T4TOTAL, T3FREE, THYROIDAB in the last 72 hours.  Invalid input(s): FREET3   Coagulation profile No results for input(s): INR, PROTIME in the last 168  hours. ------------------------------------------------------------------------------------------------------------------- No results for input(s): DDIMER in the last 72 hours. -------------------------------------------------------------------------------------------------------------------  Cardiac Enzymes Recent Labs  Lab 04/27/18 1742  TROPONINI <0.03   ------------------------------------------------------------------------------------------------------------------ Invalid input(s): POCBNP  ---------------------------------------------------------------------------------------------------------------  Urinalysis    Component Value Date/Time   COLORURINE YELLOW (A) 04/27/2018 1816   APPEARANCEUR CLOUDY (A) 04/27/2018 1816   APPEARANCEUR Hazy 07/31/2013 1006   LABSPEC 1.014 04/27/2018 1816   LABSPEC 1.024 07/31/2013 1006   PHURINE 5.0 04/27/2018 1816   GLUCOSEU 50 (A) 04/27/2018 1816   GLUCOSEU >=500 07/31/2013 1006   HGBUR NEGATIVE 04/27/2018 1816   BILIRUBINUR NEGATIVE 04/27/2018 1816   BILIRUBINUR Negative 07/31/2013 1006   KETONESUR NEGATIVE 04/27/2018 1816   PROTEINUR 100 (A) 04/27/2018 1816   NITRITE NEGATIVE 04/27/2018 1816   LEUKOCYTESUR LARGE (A) 04/27/2018 1816   LEUKOCYTESUR Negative 07/31/2013 1006     RADIOLOGY: Dg Chest Port 1 View  Result Date: 04/27/2018 CLINICAL DATA:  Generalized weakness, with fever. EXAM: PORTABLE CHEST 1 VIEW COMPARISON:  08/07/2017. FINDINGS: The heart is enlarged. Bibasilar subsegmental atelectasis. No consolidation or edema. No effusion or pneumothorax. Bones unremarkable. IMPRESSION: Stable chest. Electronically Signed   By: Staci Righter M.D.   On: 04/27/2018 18:20    EKG: Orders placed or performed during the hospital encounter of 04/27/18  . ED EKG 12-Lead  . ED EKG 12-Lead  . EKG 12-Lead  . EKG 12-Lead    IMPRESSION AND PLAN:  52 year old  female patient with history of multiple myeloma, diabetes mellitus presented  to the emergency room with dysuria and body aches and fever.  -Hypotension IV fluid hydration  -Sepsis secondary to UTI IV Zosyn antibiotic IV fluids  -Urinary tract infection IV Zosyn antibiotic Follow-up urine cultures and taper the antibiotic  -Hyponatremia Follow-up electrolytes after IV hydration  -DVT prophylaxis with subcu Lovenox daily  -Diabetes mellitus type 2  sliding scale coverage with insulin    All the records are reviewed and case discussed with ED provider. Management plans discussed with the patient, family and they are in agreement.  CODE STATUS:Full code Code Status History    Date Active Date Inactive Code Status Order ID Comments User Context   06/12/2017 2102 06/15/2017 1928 Full Code [id number]  Lance Coon, MD Inpatient   10/16/2016 2102 10/19/2016 1807 Full Code [id number]  Loletha Grayer, MD ED   02/14/2016 2247 02/17/2016 1907 Full Code [id number]  Lance Coon, MD Inpatient       TOTAL TIME TAKING CARE OF THIS PATIENT: 52 minutes.    Saundra Shelling M.D on 04/27/2018 at 9:03 PM  Between 7am to 6pm - Pager - [phone]  After 6pm go to [email or web address] - password [email or web address] [place] [facility]  Office  [phone]  CC: Primary care physician; System, Pcp Not In

## 2018-04-27 NOTE — ED Triage Notes (Signed)
Pt arrived from home via EMS with complaints of generalized weakness. Son called EMS for pt because she has had weakness for the past 2 days along with fever and chills. Pt has Hx of bone cancer. Pt has chemotherapy every month and last session was about 3-4 weeks ago. Pt is Spanish speaking. VS per EMS BP-90/50 HR-105 Temp.-102.7oral BS-277 O2sat-94%RA EMS placed pt on 2L nasal cannula. EMS placed a 20 gauge in the left wrist. EMS stated that pt appears to keep "dozing" off.

## 2018-04-27 NOTE — Progress Notes (Signed)
Advanced care plan.  Purpose of the Encounter: CODE STATUS  Parties in Attendance: Patient Patient's Decision Capacity: Good presented to the emergency room Subjective/Patient's story: With fever, burning when she passes urine and body aches Objective/Medical story Has urinary tract infection and low blood pressure Goals of care determination:  Advanced directives were discussed with the help of interpreter For now patient wants everything done which includes cardiac resuscitation, intubation and ventilator if the need arises CODE STATUS: Full code Time spent discussing advanced care planning: 16 minutes

## 2018-04-28 LAB — BASIC METABOLIC PANEL
ANION GAP: 5 (ref 5–15)
BUN: 29 mg/dL — AB (ref 6–20)
CALCIUM: 7.4 mg/dL — AB (ref 8.9–10.3)
CO2: 24 mmol/L (ref 22–32)
Chloride: 104 mmol/L (ref 101–111)
Creatinine, Ser: 0.58 mg/dL (ref 0.44–1.00)
GFR calc Af Amer: >60 mL/min (ref >60)
GLUCOSE: 272 mg/dL — AB (ref 65–99)
Potassium: 4 mmol/L (ref 3.5–5.1)
Sodium: 133 mmol/L — ABNORMAL LOW (ref 135–145)

## 2018-04-28 LAB — CBC
HCT: 25.1 % — ABNORMAL LOW (ref 35.0–47.0)
HEMOGLOBIN: 8.4 g/dL — AB (ref 12.0–16.0)
MCH: 27.5 pg (ref 26.0–34.0)
MCHC: 33.3 g/dL (ref 32.0–36.0)
MCV: 82.4 fL (ref 80.0–100.0)
Platelets: 299 10*3/uL (ref 150–440)
RBC: 3.05 MIL/uL — ABNORMAL LOW (ref 3.80–5.20)
RDW: 15.3 % — AB (ref 11.5–14.5)
WBC: 14.2 10*3/uL — ABNORMAL HIGH (ref 3.6–11.0)

## 2018-04-28 LAB — GLUCOSE, CAPILLARY
Glucose-Capillary: 224 mg/dL — ABNORMAL HIGH (ref 65–99)
Glucose-Capillary: 140 mg/dL — ABNORMAL HIGH (ref 65–99)

## 2018-04-28 MED ORDER — INSULIN STARTER KIT- PEN NEEDLES (SPANISH)
1.0000 | Freq: Once | Status: AC
  Administered 2018-04-28: 22:00:00 1
  Filled 2018-04-28: qty 1

## 2018-04-28 MED ORDER — INSULIN DETEMIR 100 UNIT/ML ~~LOC~~ SOLN
10.0000 [IU] | Freq: Every day | SUBCUTANEOUS | Status: DC
  Administered 2018-04-28 – 2018-04-29 (×2): 10 [IU] via SUBCUTANEOUS
  Filled 2018-04-28 (×3): qty 0.1

## 2018-04-28 MED ORDER — INSULIN ASPART 100 UNIT/ML ~~LOC~~ SOLN
0.0000 [IU] | Freq: Every day | SUBCUTANEOUS | Status: DC
  Administered 2018-04-29: 3 [IU] via SUBCUTANEOUS
  Filled 2018-04-28: qty 1

## 2018-04-28 MED ORDER — SODIUM CHLORIDE 0.9 % IV BOLUS
1000.0000 mL | Freq: Once | INTRAVENOUS | Status: AC
  Administered 2018-04-28: 1000 mL via INTRAVENOUS

## 2018-04-28 MED ORDER — INSULIN ASPART 100 UNIT/ML ~~LOC~~ SOLN
0.0000 [IU] | Freq: Three times a day (TID) | SUBCUTANEOUS | Status: DC
  Administered 2018-04-28: 18:00:00 5 [IU] via SUBCUTANEOUS
  Administered 2018-04-29: 08:00:00 2 [IU] via SUBCUTANEOUS
  Administered 2018-04-29: 12:00:00 3 [IU] via SUBCUTANEOUS
  Administered 2018-04-29: 18:00:00 5 [IU] via SUBCUTANEOUS
  Filled 2018-04-28 (×4): qty 1

## 2018-04-28 NOTE — Plan of Care (Signed)
  Problem: Fluid Volume: Goal: Hemodynamic stability will improve Outcome: Progressing   Problem: Clinical Measurements: Goal: Signs and symptoms of infection will decrease Outcome: Progressing   Problem: Respiratory: Goal: Ability to maintain adequate ventilation will improve Outcome: Progressing   Problem: Health Behavior/Discharge Planning: Goal: Ability to manage health-related needs will improve Outcome: Progressing   Problem: Clinical Measurements: Goal: Ability to maintain clinical measurements within normal limits will improve Outcome: Progressing Goal: Respiratory complications will improve Outcome: Progressing   Problem: Activity: Goal: Risk for activity intolerance will decrease Outcome: Progressing   Problem: Nutrition: Goal: Adequate nutrition will be maintained Outcome: Progressing   Problem: Coping: Goal: Level of anxiety will decrease Outcome: Progressing   Problem: Elimination: Goal: Will not experience complications related to bowel motility Outcome: Progressing   Problem: Pain Managment: Goal: General experience of comfort will improve Outcome: Progressing   Problem: Safety: Goal: Ability to remain free from injury will improve Outcome: Progressing   Problem: Skin Integrity: Goal: Risk for impaired skin integrity will decrease Outcome: Progressing

## 2018-04-28 NOTE — Progress Notes (Addendum)
Inpatient Diabetes Program Recommendations  AACE/ADA: New Consensus Statement on Inpatient Glycemic Control (2015)  Target Ranges:  Prepandial:   less than 140 mg/dL      Peak postprandial:   less than 180 mg/dL (1-2 hours)      Critically ill patients:  140 - 180 mg/dL  Results for HATTIE, PINE (MRN [id number]) as of 04/28/2018 09:24  Ref. Range 04/27/2018 17:42 04/28/2018 04:21  Glucose Latest Ref Range: 65 - 99 mg/dL 257 (H) 272 (H)   Results for EMOGENE, MURATALLA (MRN [id number]) as of 04/28/2018 09:24  Ref. Range 04/27/2018 22:05  Glucose-Capillary Latest Ref Range: 65 - 99 mg/dL 224 (H)   Review of Glycemic Control  Diabetes history: DM2 Outpatient Diabetes medications: NPH 20 units BID, Glumetza 1000 mg BID, Glipizide 10 mg daily Current orders for Inpatient glycemic control: None  Inpatient Diabetes Program Recommendations: Insulin - Basal: Please consider ordering Levemir 10 units Q24H starting now (based on 67 kg x 0.15 units). Correction (SSI): Please consider ordering Novolog 0-15 units TID with meals and Novolog 0-5 units QHS.  Addendum 04/28/18_0 :15-Spoke with patient and her daughter (using translator) about diabetes and home regimen for diabetes control. Patient reports that she is followed by [facility] for diabetes management and currently she takes NPH 20 units BID, Glumetza 1000 mg BID, Glipizide 10 mg daily  as an outpatient for diabetes control. Inquired about Humalog insulin and patient states that she is not taking Humalog. Inquired whether she had Humalog insulin at home and patient stated she did not but her daughter stated that she did have another kind of insulin at home in an insulin pen that she received from the clinic. Patient's daughter states that they are not sure how to use the insulin pen because they use vial/syringe for NPH insulin injections. Educated patient and her daughter on insulin pen use at home. Reviewed all steps of insulin pen including  attachment of needle, 2-unit air shot, dialing up dose, giving injection, removing needle, disposal of sharps, storage of unused insulin, disposal of insulin etc. Asked patient to redemonstrate and she reports that she does not give herself any injections as her daughter administers all insulin shots to her. Patient's daughter was able to provide successful return demonstration. Will order insulin pen starter kit as well for patient and family to refer back to at home. Patient reports that she is taking DM medications consistently as indicated. Patient states that she checks her glucose at home and over the past 2-3 days it has been in the 100's but it is usually in the 200-300's mg/dl.  Inquired about knowledge of an A1C and patient does not know what an A1C is. Discussed A1C results (>14% on 03/03/18) and explained that A1C indicates an average glucose of greater than 360 mg/dl. Discussed glucose and A1C goals. Discussed importance of checking CBGs and maintaining good CBG control to prevent long-term and short-term complications. Explained how hyperglycemia leads to damage within blood vessels which lead to the common complications seen with uncontrolled diabetes. Stressed to the patient the importance of improving glycemic control to prevent further complications from uncontrolled diabetes. Noted patient has had chemotherapy last 3-4 weeks ago. Inquired about any use of steroids (Prednisone or Decadron) and patient reports that she is not aware of getting either medication. Explained that if she is ever given steroids, it will contribute to hyperglycemia and encouraged patient to ask MD about whether she needs to make insulin changes if she has higher glucose  on those medications. Inquired about willingness to take Humalog as her PCP has prescribed and patient is willing to take Humalog insulin at home. Per office visit note on 04/06/2018 by Dr. Nonnie Done patient was asked to start Humalog 5 units with evening  meal. Asked patient that if Humalog was not addressed on discharged medications to call the clinic and ask if she is suppose to take only with evening meal. Encouraged patient to keep a log of glucose and insulin taken and to take it with her to follow up visits.  Patient verbalized understanding of information discussed and she states that she has no further questions at this time related to diabetes.  Thanks, Barnie Alderman, RN, MSN, CDE Diabetes Coordinator Inpatient Diabetes Program [phone] (Team Pager from 8am to 5pm)

## 2018-04-28 NOTE — Progress Notes (Signed)
[place] at [place] NAME: Ruth Walters    MR#:  [id number]  DATE OF BIRTH:  1966/08/11  SUBJECTIVE:   Pt. Here due to generalized weakness and noted to have a UTI.  Patient says she feels a little bit better since yesterday and has more energy.  Afebrile and otherwise stable.  REVIEW OF SYSTEMS:    Review of Systems  Constitutional: Negative for chills and fever.  HENT: Negative for congestion and tinnitus.   Eyes: Negative for blurred vision and double vision.  Respiratory: Negative for cough, shortness of breath and wheezing.   Cardiovascular: Negative for chest pain, orthopnea and PND.  Gastrointestinal: Negative for abdominal pain, diarrhea, nausea and vomiting.  Genitourinary: Negative for dysuria and hematuria.  Neurological: Positive for weakness (generalized). Negative for dizziness, sensory change and focal weakness.  All other systems reviewed and are negative.   Nutrition: Heart Healthy Tolerating Diet: Yes Tolerating PT: Await Eval.     DRUG ALLERGIES:  No Known Allergies  VITALS:  Blood pressure (!) 94/59, pulse 81, temperature 98.3 F (36.8 C), temperature source Oral, resp. rate 14, height 5' (1.524 m), weight 67.9 kg (149 lb 9.6 oz), SpO2 99 %.  PHYSICAL EXAMINATION:   Physical Exam  GENERAL:  52 y.o.-year-old patient lying in bed in no acute distress.  EYES: Pupils equal, round, reactive to light and accommodation. No scleral icterus. Extraocular muscles intact.  HEENT: Head atraumatic, normocephalic. Oropharynx and nasopharynx clear.  NECK:  Supple, no jugular venous distention. No thyroid enlargement, no tenderness.  LUNGS: Normal breath sounds bilaterally, no wheezing, rales, rhonchi. No use of accessory muscles of respiration.  CARDIOVASCULAR: S1, S2 normal. No murmurs, rubs, or gallops.  ABDOMEN: Soft, nontender, nondistended. Bowel sounds present. No organomegaly or mass.  EXTREMITIES: No cyanosis,  clubbing or edema b/l.    NEUROLOGIC: Cranial nerves II through XII are intact. No focal Motor or sensory deficits b/l. Globally weak.    PSYCHIATRIC: The patient is alert and oriented x 3.  SKIN: No obvious rash, lesion, or ulcer.    LABORATORY PANEL:   CBC Recent Labs  Lab 04/28/18 0421  WBC 14.2*  HGB 8.4*  HCT 25.1*  PLT 299   ------------------------------------------------------------------------------------------------------------------  Chemistries  Recent Labs  Lab 04/27/18 1742 04/28/18 0421  NA 133* 133*  K 4.3 4.0  CL 100* 104  CO2 25 24  GLUCOSE 257* 272*  BUN 27* 29*  CREATININE 0.65 0.58  CALCIUM 8.2* 7.4*  AST 17  --   ALT 22  --   ALKPHOS 129*  --   BILITOT 0.6  --    ------------------------------------------------------------------------------------------------------------------  Cardiac Enzymes Recent Labs  Lab 04/27/18 1742  TROPONINI <0.03   ------------------------------------------------------------------------------------------------------------------  RADIOLOGY:  Dg Chest Port 1 View  Result Date: 04/27/2018 CLINICAL DATA:  Generalized weakness, with fever. EXAM: PORTABLE CHEST 1 VIEW COMPARISON:  08/07/2017. FINDINGS: The heart is enlarged. Bibasilar subsegmental atelectasis. No consolidation or edema. No effusion or pneumothorax. Bones unremarkable. IMPRESSION: Stable chest. Electronically Signed   By: Staci Righter M.D.   On: 04/27/2018 18:20     ASSESSMENT AND PLAN:   52 year old Hispanic female with past medical history of multiple myeloma currently undergoing treatment, diabetes, chronic hypotension who presented to the hospital due to generalized weakness and malaise and noted to have a urinary tract infection.  1.  Urinary tract infection- this is based off a urinalysis on admission. - Continue IV Zosyn - Cultures growing gram-negative rod  but not identified yet.   2.  Hypotension-this is chronic for the patient.  She is  clinically asymptomatic right now.  Continue IV fluids, follow hemodynamics.  3.  Diabetes type 2 without complication- blood sugars running a bit high, appreciate diabetes coordinator input, will resume patient's Levemir, sliding scale insulin.  Follow blood sugars.  4.  History of multiple myeloma- pending follow-up with oncology as an outpatient. -Patient currently on treatment.    All the records are reviewed and case discussed with Care Management/Social Worker. Management plans discussed with the patient, family and they are in agreement.  CODE STATUS: Full code  DVT Prophylaxis: Lovenox  TOTAL TIME TAKING CARE OF THIS PATIENT: 30 minutes.   POSSIBLE D/C IN 1-2 DAYS, DEPENDING ON CLINICAL CONDITION.   Henreitta Leber M.D on 04/28/2018 at 2:15 PM  Between 7am to 6pm - Pager - [phone]  After 6pm go to [email or web address] - Proofreader  [facility]  Office  [phone]  CC: Primary care physician; System, Pcp Not In

## 2018-04-28 NOTE — Progress Notes (Signed)
Pharmacy Antibiotic Note  Ruth Walters is a 52 y.o. female admitted on 04/27/2018 with sepsis/UTI.  Pharmacy has been consulted for zosyn dosing.  Plan: Zosyn 3.375g IV q8h (4 hour infusion).  Height: 5' (152.4 cm) Weight: 149 lb 9.6 oz (67.9 kg) IBW/kg (Calculated) : 45.5  Temp (24hrs), Avg:98.9 F (37.2 C), Min:97.7 F (36.5 C), Max:100.6 F (38.1 C)  Recent Labs  Lab 04/27/18 1742 04/27/18 2032 04/28/18 0421  WBC 17.2*  --  14.2*  CREATININE 0.65  --  0.58  LATICACIDVEN 0.8 0.6  --     Estimated Creatinine Clearance: 71.6 mL/min (by C-G formula based on SCr of 0.58 mg/dL).    No Known Allergies  Vanc x1 5/9 Zosyn 5/9 >>  BCx x2 NGTD UCx sent  Thank you for allowing pharmacy to be a part of this patient's care.  Rayna Sexton, PharmD, BCPS Clinical Pharmacist 04/28/2018 9:08 AM

## 2018-04-29 LAB — URINE CULTURE: Culture: >=100000 — AB

## 2018-04-29 LAB — CBC
HCT: 25.9 % — ABNORMAL LOW (ref 35.0–47.0)
Hemoglobin: 8.8 g/dL — ABNORMAL LOW (ref 12.0–16.0)
MCH: 27.9 pg (ref 26.0–34.0)
MCHC: 33.9 g/dL (ref 32.0–36.0)
MCV: 82.4 fL (ref 80.0–100.0)
PLATELETS: 334 10*3/uL (ref 150–440)
RBC: 3.15 MIL/uL — ABNORMAL LOW (ref 3.80–5.20)
RDW: 15 % — AB (ref 11.5–14.5)
WBC: 9.5 10*3/uL (ref 3.6–11.0)

## 2018-04-29 LAB — GLUCOSE, CAPILLARY
Glucose-Capillary: 125 mg/dL — ABNORMAL HIGH (ref 65–99)
Glucose-Capillary: 178 mg/dL — ABNORMAL HIGH (ref 65–99)
Glucose-Capillary: 248 mg/dL — ABNORMAL HIGH (ref 65–99)
Glucose-Capillary: 284 mg/dL — ABNORMAL HIGH (ref 65–99)

## 2018-04-29 NOTE — Progress Notes (Signed)
[place] at [place] NAME: Ruth Walters    MR#:  [id number]  DATE OF BIRTH:  25-Sep-1966  SUBJECTIVE: Afebrile.   Pt. Here due to generalized weakness and noted to have a UTI.  Patient says she feels a little bit better since yesterday and has more energy.  Afebrile and otherwise stable.  REVIEW OF SYSTEMS:    Review of Systems  Constitutional: Negative for chills and fever.  HENT: Negative for congestion and tinnitus.   Eyes: Negative for blurred vision and double vision.  Respiratory: Negative for cough, shortness of breath and wheezing.   Cardiovascular: Negative for chest pain, orthopnea and PND.  Gastrointestinal: Negative for abdominal pain, diarrhea, nausea and vomiting.  Genitourinary: Negative for dysuria and hematuria.  Neurological: Positive for weakness (generalized). Negative for dizziness, sensory change and focal weakness.  All other systems reviewed and are negative.   Nutrition: Heart Healthy Tolerating Diet: Yes Tolerating PT: Await Eval.     DRUG ALLERGIES:  No Known Allergies  VITALS:  Blood pressure (!) 90/41, pulse 88, temperature 98.4 F (36.9 C), temperature source Oral, resp. rate 18, height 5' (1.524 m), weight 67.9 kg (149 lb 9.6 oz), SpO2 94 %.  PHYSICAL EXAMINATION:   Physical Exam  GENERAL:  52 y.o.-year-old patient lying in bed in no acute distress.  EYES: Pupils equal, round, reactive to light and accommodation. No scleral icterus. Extraocular muscles intact.  HEENT: Head atraumatic, normocephalic. Oropharynx and nasopharynx clear.  NECK:  Supple, no jugular venous distention. No thyroid enlargement, no tenderness.  LUNGS: Normal breath sounds bilaterally, no wheezing, rales, rhonchi. No use of accessory muscles of respiration.  CARDIOVASCULAR: S1, S2 normal. No murmurs, rubs, or gallops.  ABDOMEN: Soft, nontender, nondistended. Bowel sounds present. No organomegaly or mass.  EXTREMITIES: No  cyanosis, clubbing or edema b/l.    NEUROLOGIC: Cranial nerves II through XII are intact. No focal Motor or sensory deficits b/l. Globally weak.    PSYCHIATRIC: The patient is alert and oriented x 3.  SKIN: No obvious rash, lesion, or ulcer.    LABORATORY PANEL:   CBC Recent Labs  Lab 04/29/18 0522  WBC 9.5  HGB 8.8*  HCT 25.9*  PLT 334   ------------------------------------------------------------------------------------------------------------------  Chemistries  Recent Labs  Lab 04/27/18 1742 04/28/18 0421  NA 133* 133*  K 4.3 4.0  CL 100* 104  CO2 25 24  GLUCOSE 257* 272*  BUN 27* 29*  CREATININE 0.65 0.58  CALCIUM 8.2* 7.4*  AST 17  --   ALT 22  --   ALKPHOS 129*  --   BILITOT 0.6  --    ------------------------------------------------------------------------------------------------------------------  Cardiac Enzymes Recent Labs  Lab 04/27/18 1742  TROPONINI <0.03   ------------------------------------------------------------------------------------------------------------------  RADIOLOGY:  Dg Chest Port 1 View  Result Date: 04/27/2018 CLINICAL DATA:  Generalized weakness, with fever. EXAM: PORTABLE CHEST 1 VIEW COMPARISON:  08/07/2017. FINDINGS: The heart is enlarged. Bibasilar subsegmental atelectasis. No consolidation or edema. No effusion or pneumothorax. Bones unremarkable. IMPRESSION: Stable chest. Electronically Signed   By: Staci Righter M.D.   On: 04/27/2018 18:20     ASSESSMENT AND PLAN:   52 year old Hispanic female with past medical history of multiple myeloma currently undergoing treatment, diabetes, chronic hypotension who presented to the hospital due to generalized weakness and malaise and noted to have a urinary tract infection.  1.  Urinary tract infection- this is based off a urinalysis on admission. - Continue IV Zosyn - Cultures growing gram-negative  rod but sensitivities are not resulted yet.,  2.  Hypotension-this is chronic  for the patient.  She is clinically asymptomatic right now.  IV fluids stopped..  3.  Diabetes type 2 without complication- better controlled than yesterday, continue Levemir, scale insulin with coverage.  4.  History of multiple myeloma- pending follow-up with oncology as an outpatient. -Patient currently on treatment.  #5 generalized weakness, physical therapy consult today Used hospital provided Spanish interpreter.  All the records are reviewed and case discussed with Care Management/Social Worker. Management plans discussed with the patient, family and they are in agreement.  CODE STATUS: Full code  DVT Prophylaxis: Lovenox  TOTAL TIME TAKING CARE OF THIS PATIENT: 30 minutes.   POSSIBLE D/C IN 1-2 DAYS, DEPENDING ON CLINICAL CONDITION.   Epifanio Lesches M.D on 04/29/2018 at 8:20 AM  Between 7am to 6pm - Pager - [phone]  After 6pm go to [email or web address] - Proofreader  [facility]  Office  [phone]  CC: Primary care physician; System, Pcp Not In

## 2018-04-29 NOTE — Evaluation (Signed)
Physical Therapy Evaluation Patient Details Name: Ruth Walters MRN: [id number] DOB: October 28, 1966 Today's Date: 04/29/2018   History of Present Illness  52 yo female with onset of UTI and sepsis was admitted.  PMHx:  multiple myeloma, DM, chronic orthostasis,   Clinical Impression  Pt was seen via translation from hosp interpreter to determine how independent pt is with RW vs another AD.  Pt is going home alone with some limited assistance, and had used SPC previously.  Has a staired entrance to her home and so determined she needs to walk on stairs and with SPC to dc home without reservations.  Will focus on this for next visit and recommend HHPT follow her for safety and balance training.    Follow Up Recommendations Home health PT;Supervision for mobility/OOB    Equipment Recommendations       Recommendations for Other Services       Precautions / Restrictions Precautions Precautions: Fall Precaution Comments: monitor O2 sats and pulses Restrictions Weight Bearing Restrictions: No      Mobility  Bed Mobility Overal bed mobility: Modified Independent                Transfers Overall transfer level: Needs assistance Equipment used: Rolling walker (2 wheeled);1 person hand held assist Transfers: Sit to/from Stand Sit to Stand: Min guard            Ambulation/Gait Ambulation/Gait assistance: Min guard Ambulation Distance (Feet): 300 Feet Assistive device: Rolling walker (2 wheeled);1 person hand held assist Gait Pattern/deviations: Step-through pattern;Decreased stride length;Trunk flexed;Narrow base of support Gait velocity: reduced Gait velocity interpretation: <1.31 ft/sec, indicative of household Conservation officer, historic buildings [place] (Stroke Patients Only)       Balance Overall balance assessment: Needs assistance Sitting-balance support: Feet supported Sitting balance-Leahy Scale: Good     Standing  balance support: Bilateral upper extremity supported Standing balance-Leahy Scale: Fair                               Pertinent Vitals/Pain Pain Assessment: No/denies pain    Home Living Family/patient expects to be discharged to:: Private residence Living Arrangements: Children Available Help at Discharge: Family;Available 24 hours/day Type of Home: House Home Access: Stairs to enter Entrance Stairs-Rails: Can reach both;Left;Right Entrance Stairs-Number of Steps: 4 Home Layout: One level        Prior Function Level of Independence: Independent with assistive device(s)         Comments: used SPC with no assistance     Hand Dominance   Dominant Hand: Right    Extremity/Trunk Assessment   Upper Extremity Assessment Upper Extremity Assessment: Overall WFL for tasks assessed    Lower Extremity Assessment Lower Extremity Assessment: Overall WFL for tasks assessed    Cervical / Trunk Assessment Cervical / Trunk Assessment: Normal  Communication   Communication: No difficulties  Cognition Arousal/Alertness: Awake/alert Behavior During Therapy: WFL for tasks assessed/performed Overall Cognitive Status: Within Functional Limits for tasks assessed                                        General Comments      Exercises     Assessment/Plan    PT Assessment Patient needs continued PT services  PT  Problem List Decreased strength;Decreased activity tolerance;Decreased range of motion;Decreased balance;Decreased mobility;Decreased coordination;Decreased knowledge of use of DME;Decreased safety awareness;Cardiopulmonary status limiting activity       PT Treatment Interventions DME instruction;Gait training;Stair training;Functional mobility training;Therapeutic activities;Therapeutic exercise;Balance training;Neuromuscular re-education;Patient/family education    PT Goals (Current goals can be found in the Care Plan section)  Acute Rehab  PT Goals Patient Stated Goal: to get home via translator PT Goal Formulation: With patient Time For Goal Achievement: 05/13/18 Potential to Achieve Goals: Good    Frequency Min 2X/week   Barriers to discharge Inaccessible home environment home has 4 steps and pt is declining to use RW at home    Co-evaluation               AM-PAC PT "6 Clicks" Daily Activity  Outcome Measure Difficulty turning over in bed (including adjusting bedclothes, sheets and blankets)?: A Little Difficulty moving from lying on back to sitting on the side of the bed? : Unable Difficulty sitting down on and standing up from a chair with arms (e.g., wheelchair, bedside commode, etc,.)?: Unable Help needed moving to and from a bed to chair (including a wheelchair)?: A Little Help needed walking in hospital room?: A Little Help needed climbing 3-5 steps with a railing? : A Little 6 Click Score: 14    End of Session Equipment Utilized During Treatment: Gait belt Activity Tolerance: Patient tolerated treatment well Patient left: in bed;with call bell/phone within reach;with bed alarm set Nurse Communication: Mobility status PT Visit Diagnosis: Unsteadiness on feet (R26.81);Other abnormalities of gait and mobility (R26.89);Muscle weakness (generalized) (M62.81);Difficulty in walking, not elsewhere classified (R26.2)    Time: 3735-7897 PT Time Calculation (min) (ACUTE ONLY): 27 min   Charges:   PT Evaluation $PT Eval Moderate Complexity: 1 Mod     PT G Codes:   PT G-Codes **NOT FOR INPATIENT CLASS** Functional Assessment Tool Used: AM-PAC 6 Clicks Basic Mobility    Ramond Dial 04/29/2018, 10:04 PM   Mee Hives, PT MS Acute Rehab Dept. Number: [place] [facility] [place]

## 2018-04-30 LAB — GLUCOSE, CAPILLARY: GLUCOSE-CAPILLARY: 124 mg/dL — AB (ref 65–99)

## 2018-04-30 MED ORDER — CEPHALEXIN 500 MG PO CAPS: 500.0000 mg | ORAL_CAPSULE | Freq: Two times a day (BID) | ORAL | 0 refills | Status: AC

## 2018-04-30 MED ORDER — CEPHALEXIN 500 MG PO CAPS: 500.0000 mg | ORAL_CAPSULE | Freq: Four times a day (QID) | ORAL | 0 refills | Status: DC

## 2018-04-30 MED ORDER — ZOLPIDEM TARTRATE 5 MG PO TABS
5.0000 mg | ORAL_TABLET | Freq: Every evening | ORAL | Status: DC | PRN
  Administered 2018-04-30: 03:00:00 5 mg via ORAL
  Filled 2018-04-30: qty 1

## 2018-04-30 NOTE — Care Management (Signed)
Patient admitted from home with UTI.  Patient lives at home with her children.  Patient follows up with her primary care at [facility].  Patient has informed MD that she has a follow up appointment scheduled with her PCP on Tuesday.  Patient is listed as self patient.  Patient declined for MD to write scripts for any of her home medications.  Patient will discharge with script for Keflex.  RNCM requested for MD to print prescription. Course of Keflex is $7 at [facility].  No coupon to be provided.  PT has assessed patient and recommends home health PT. At baseline patient is independent with a cane.  Patient has a cane in the home.  Patient was able to ambulated 300 feet with PT.  Patient will not meet homebound criteria for charity PT.  Home health services will not be arranged.  Should her condition change and she meet home bound criteria her PCP can set home health up outpatient.  RNCM signing off.

## 2018-04-30 NOTE — Progress Notes (Signed)
52 year old female patient admitted for sepsis/UTI.  Patient stable, urine cultures grew E. coli and sensitivities are also back.  Discharged home with Keflex for 10 days, patient said she has appointment with [facility] PCP on Tuesday and she can get all refills on her diabetic medication.  She has enough supply of diabetic medication and does not want any prescriptions except UTI medicine.  Discharge home with home health physical therapy as per physical therapy recommendation.  Continue metformin, glipizide, NPH insulin 20 units 2 times daily before meals.  Spoke with patient through help of Spanish interpreter and also reviewed her discharge medication with her when the interpreter was in the room.  She had no further questions. More than 50% of time spent in coordination, counseling of care. Total time spent more than 30 minutes. [Full discharge summary to follow.

## 2018-04-30 NOTE — Progress Notes (Signed)
Pt D/C to home with husband. IV removed intact. Education given with help from interpreter. All questions answered.

## 2018-05-02 LAB — CULTURE, BLOOD (ROUTINE X 2)
Culture: NO GROWTH
Culture: NO GROWTH
SPECIAL REQUESTS: ADEQUATE

## 2018-05-06 NOTE — Discharge Summary (Signed)
Ruth Walters, is a 52 y.o. female  DOB 1966/08/29  MRN [id number].  Admission date:  04/27/2018  Admitting Physician  Saundra Shelling, MD  Discharge Date:  04/30/2018   Primary MD  System, Pcp Not In  Recommendations for primary care physician for things to follow:  Patient has PCP at [facility] advised her to follow-up with her PCP at [facility].   Admission Diagnosis  Sepsis, due to unspecified organism ([place]) [A41.9] Urinary tract infection without hematuria, site unspecified [N39.0]   Discharge Diagnosis  Sepsis, due to unspecified organism ([place]) [A41.9] Urinary tract infection without hematuria, site unspecified [N39.0]    Active Problems:   Hypotension      Past Medical History:  Diagnosis Date  . Cancer ([place])    Multiple Myleoma  . Diabetes mellitus without complication ([place])   . Low blood pressure     Past Surgical History:  Procedure Laterality Date  . ABDOMINAL HYSTERECTOMY         History of present illness and  Hospital Course:     Kindly see H&P for history of present illness and admission details, please review complete Labs, Consult reports and Test reports for all details in brief  HPI  from the history and physical done on the day of admission 52 year old Hispanic female history of multiple myeloma, diabetes mellitus type 2 admitted for generalized weakness, fever, dysuria.  Patient found to have UTI.  Hospital Course  Sepsis present on admission secondary to UTI: Patient received IV Zosyn, IV fluids, patient WBC 17.2 when  she came, decreased to 9.5 at discharge.  Send urine culture showed E. coli sensitive to all antibiotics except Cipro Bactrim.  Charged home with Keflex 500 mg  Po twice daily for 10 days.  She has her home with home health physical therapy. 2.  Diabetes mellitus type 2: Continue  glipizide, metformin, NPH insulin 20 units 2 times daily before meals 3. hypotension on arrival but improved with IV fluids. Used hospital provided [place] interpreter. Discharge Condition: Stable   Follow UP  Follow-up with PCP at [facility] as scheduled   Discharge Instructions  and  Discharge Medications      Allergies as of 04/30/2018   No Known Allergies     Medication List    TAKE these medications   aspirin EC 81 MG tablet Take 81 mg by mouth daily.   cephALEXin 500 MG capsule Commonly known as:  KEFLEX Take 1 capsule (500 mg total) by mouth 2 (two) times daily for 10 days.   glipiZIDE 10 MG tablet Commonly known as:  GLUCOTROL Take 1 tablet (10 mg total) by mouth 2 (two) times daily before a meal. What changed:  when to take this   HUMALOG KWIKPEN 100 UNIT/ML KiwkPen Generic drug:  insulin lispro Inject 5 Units into the skin daily before supper. Patient has not started taking this but has Humalog insulin pen at home but patient and family did not know how to use it   insulin NPH Human 100 UNIT/ML injection Commonly known as:  HUMULIN N,NOVOLIN N Inject 20 Units into the skin 2 (two) times daily before a meal.   loratadine 10 MG tablet Commonly known as:  CLARITIN Take 10 mg by mouth daily.   metFORMIN 1000 MG (MOD) 24 hr tablet Commonly known as:  GLUMETZA Take 1 tablet (1,000 mg total) by mouth daily with breakfast. What changed:  when to take this   multivitamin with minerals Tabs tablet Take 1 tablet by  mouth daily.   polyethylene glycol packet Commonly known as:  MIRALAX / GLYCOLAX Take 17 g by mouth daily as needed.   senna 8.6 MG Tabs tablet Commonly known as:  SENOKOT Take 2 tablets by mouth at bedtime.         Diet and Activity recommendation: See Discharge Instructions above   Consults obtained -Physical  therapy   Major procedures and Radiology Reports - PLEASE review detailed and final reports for all details, in brief -       Dg Chest Port 1 View  Result Date: 04/27/2018 CLINICAL DATA:  Generalized weakness, with fever. EXAM: PORTABLE CHEST 1 VIEW COMPARISON:  08/07/2017. FINDINGS: The heart is enlarged. Bibasilar subsegmental atelectasis. No consolidation or edema. No effusion or pneumothorax. Bones unremarkable. IMPRESSION: Stable chest. Electronically Signed   By: Staci Righter M.D.   On: 04/27/2018 18:20    Micro Results     Recent Results (from the past 240 hour(s))  Blood Culture (routine x 2)     Status: None   Collection Time: 04/27/18  5:43 PM  Result Value Ref Range Status   Specimen Description BLOOD RIGHT ANTECUBITAL  Final   Special Requests   Final    BOTTLES DRAWN AEROBIC AND ANAEROBIC Blood Culture results may not be optimal due to an excessive volume of blood received in culture bottles   Culture   Final    NO GROWTH 5 DAYS Performed at [facility], [address] [place], [place] [address]    Report Status 05/02/2018 FINAL  Final  Blood Culture (routine x 2)     Status: None   Collection Time: 04/27/18  5:48 PM  Result Value Ref Range Status   Specimen Description BLOOD RIGHT WRIST  Final   Special Requests   Final    BOTTLES DRAWN AEROBIC AND ANAEROBIC Blood Culture adequate volume   Culture   Final    NO GROWTH 5 DAYS Performed at [facility], [address] [place], [place] [address]    Report Status 05/02/2018 FINAL  Final  Urine culture     Status: Abnormal   Collection Time: 04/27/18  6:16 PM  Result Value Ref Range Status   Specimen Description   Final    URINE, RANDOM Performed at [facility], [address] [place], [place] [address]    Special Requests   Final    NONE Performed at [facility], [address] [place], [place] [address]    Culture >=100,000 COLONIES/mL ESCHERICHIA COLI (A)  Final   Report Status 04/29/2018 FINAL  Final   Organism ID, Bacteria ESCHERICHIA COLI (A)  Final      Susceptibility    Escherichia coli - MIC*    AMPICILLIN 8 SENSITIVE Sensitive     CEFAZOLIN <=4 SENSITIVE Sensitive     CEFTRIAXONE <=1 SENSITIVE Sensitive     CIPROFLOXACIN >=4 RESISTANT Resistant     GENTAMICIN <=1 SENSITIVE Sensitive     IMIPENEM <=0.25 SENSITIVE Sensitive     NITROFURANTOIN <=16 SENSITIVE Sensitive     TRIMETH/SULFA >=320 RESISTANT Resistant     AMPICILLIN/SULBACTAM 4 SENSITIVE Sensitive     PIP/TAZO <=4 SENSITIVE Sensitive     Extended ESBL NEGATIVE Sensitive     * >=100,000 COLONIES/mL ESCHERICHIA COLI       Today   Subjective:   Ruth Walters today has no headache,no chest abdominal pain,no new weakness tingling or numbness, feels much better wants to go home today.  Objective:   Blood pressure 99/61, pulse 77, temperature (!) 97.5 F (36.4 C), temperature source Oral, resp. rate 18, height 5' (1.524 m), weight 67.9 kg (149 lb 9.6 oz), SpO2 97 %.  No intake or output data in the 24 hours ending 05/06/18 1303  Exam Awake Alert, Oriented x 3, No new F.N deficits, Normal affect [place].AT,PERRAL Supple Neck,No JVD, No cervical lymphadenopathy appriciated.  Symmetrical Chest wall movement, Good air movement bilaterally, CTAB RRR,No Gallops,Rubs or new Murmurs, No Parasternal Heave +ve B.Sounds, Abd Soft, Non tender, No organomegaly appriciated, No rebound -guarding or rigidity. No Cyanosis, Clubbing or edema, No new Rash or bruise  Data Review   CBC w Diff:  Lab Results  Component Value Date   WBC 9.5 04/29/2018   HGB 8.8 (L) 04/29/2018   HGB 12.9 07/31/2013   HCT 25.9 (L) 04/29/2018   HCT 36.5 07/31/2013   PLT 334 04/29/2018   PLT 437 07/31/2013   LYMPHOPCT 8 04/27/2018   MONOPCT 7 04/27/2018   EOSPCT 0 04/27/2018   BASOPCT 0 04/27/2018    CMP:  Lab Results  Component Value Date   NA 133 (L) 04/28/2018   NA 136 07/31/2013   K 4.0 04/28/2018   K 4.0 07/31/2013   CL 104 04/28/2018   CL 101 07/31/2013   CO2 24 04/28/2018   CO2 30 07/31/2013   BUN 29  (H) 04/28/2018   BUN 18 07/31/2013   CREATININE 0.58 04/28/2018   CREATININE 0.28 (L) 07/31/2013   PROT 6.6 04/27/2018   PROT 7.6 07/31/2013   ALBUMIN 3.2 (L) 04/27/2018   ALBUMIN 3.0 (L) 07/31/2013   BILITOT 0.6 04/27/2018   BILITOT 0.4 07/31/2013   ALKPHOS 129 (H) 04/27/2018   ALKPHOS 159 (H) 07/31/2013   AST 17 04/27/2018   AST 12 (L) 07/31/2013   ALT 22 04/27/2018   ALT 18 07/31/2013  .   Total Time in preparing paper work, data evaluation and todays exam - 35 minutes  Epifanio Lesches M.D on 04/30/2018 at 1:03 PM    Note: This dictation was prepared with Dragon dictation along with smaller phrase technology. Any transcriptional errors that result from this process are unintentional.

## 2018-07-04 ENCOUNTER — Emergency Department
Admission: EM | Admit: 2018-07-04 | Discharge: 2018-07-04 | Disposition: A | Discharge status: Home / Self Care | Payer: Self-pay | Attending: Emergency Medicine | Admitting: Emergency Medicine

## 2018-07-04 ENCOUNTER — Emergency Department: Payer: Self-pay

## 2018-07-04 ENCOUNTER — Encounter: Payer: Self-pay | Admitting: Emergency Medicine

## 2018-07-04 ENCOUNTER — Other Ambulatory Visit: Payer: Self-pay

## 2018-07-04 DIAGNOSIS — Z79899 Other long term (current) drug therapy: Secondary | ICD-10-CM | POA: Insufficient documentation

## 2018-07-04 DIAGNOSIS — Z794 Long term (current) use of insulin: Secondary | ICD-10-CM | POA: Insufficient documentation

## 2018-07-04 DIAGNOSIS — Z7982 Long term (current) use of aspirin: Secondary | ICD-10-CM | POA: Insufficient documentation

## 2018-07-04 DIAGNOSIS — R51 Headache: Secondary | ICD-10-CM | POA: Insufficient documentation

## 2018-07-04 DIAGNOSIS — E119 Type 2 diabetes mellitus without complications: Secondary | ICD-10-CM | POA: Insufficient documentation

## 2018-07-04 DIAGNOSIS — R519 Headache, unspecified: Secondary | ICD-10-CM

## 2018-07-04 IMAGING — CT CT HEAD W/O CM
3 of 4 series · 16 of 47 positions shown, 19 images · non-contrast
Comparison: 06/12/2017

CLINICAL DATA: Headache.  Multiple myeloma.

EXAM:
CT HEAD WITHOUT CONTRAST
TECHNIQUE: Contiguous axial images were obtained from the base of the skull
through the vertex without intravenous contrast.

[Series 2: head wo · axial · 0.39mm/px · z∈[-66,+49]mm · 10 of 29 slices shown, 13 images]
[im 3/29  brain]
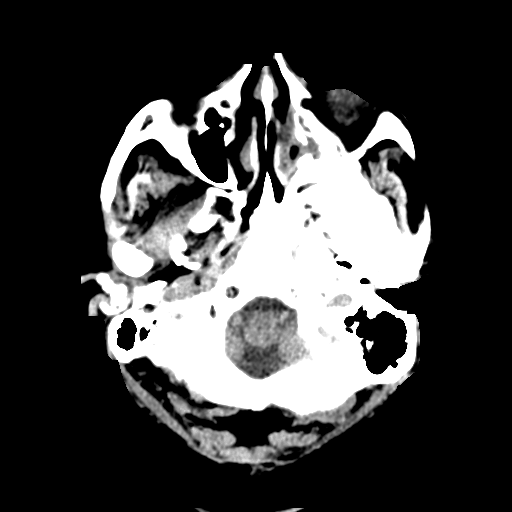
[im 3/29  bone]
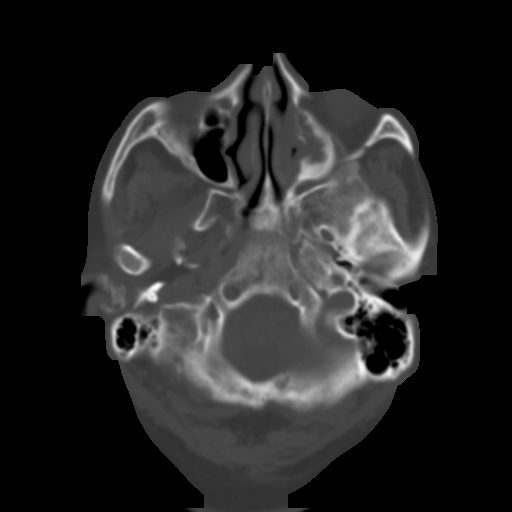
[im 5/29  brain]
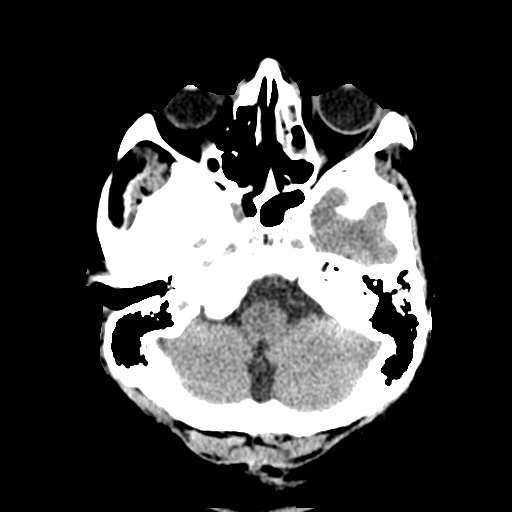
[im 8/29  brain]
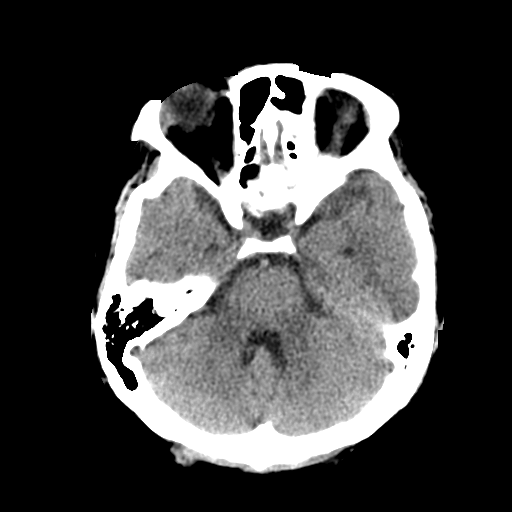
[im 11/29  brain]
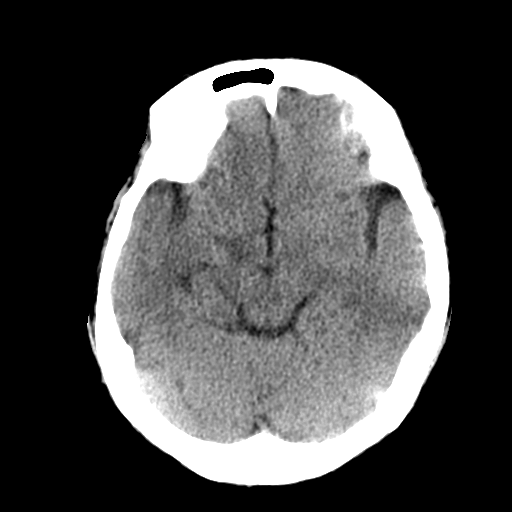
[im 13/29  brain]
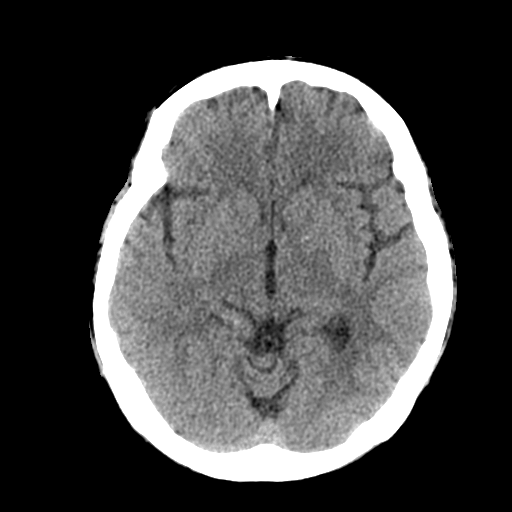
[im 13/29  bone]
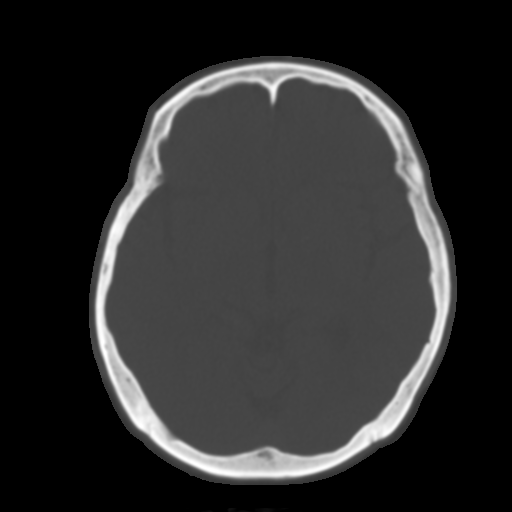
[im 16/29  brain]
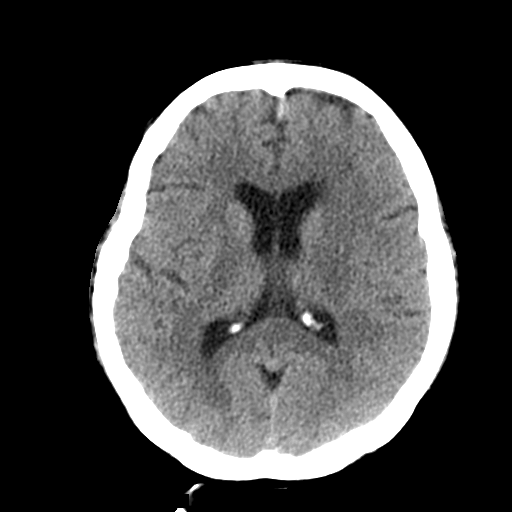
[im 18/29  brain]
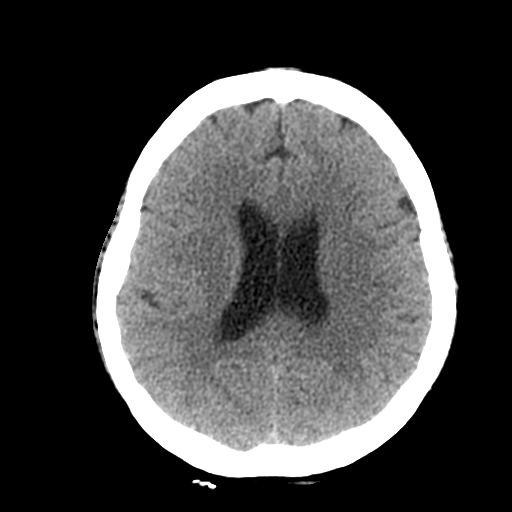
[im 22/29  brain]
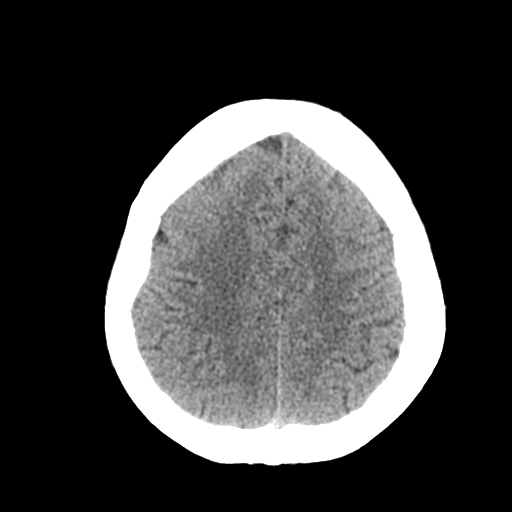
[im 24/29  brain]
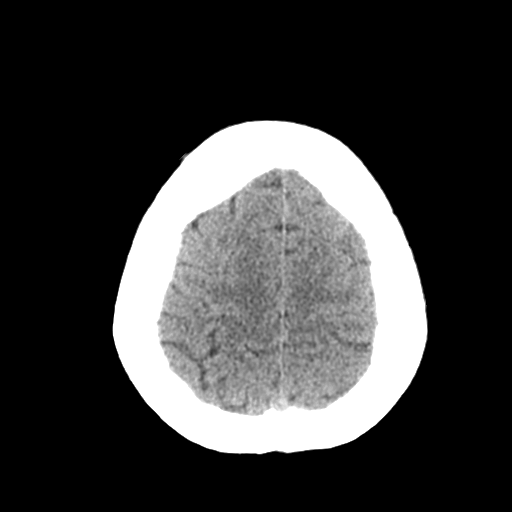
[im 24/29  bone]
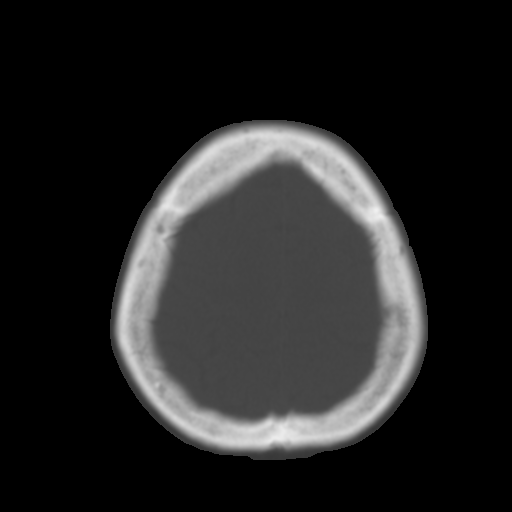
[im 26/29  brain]
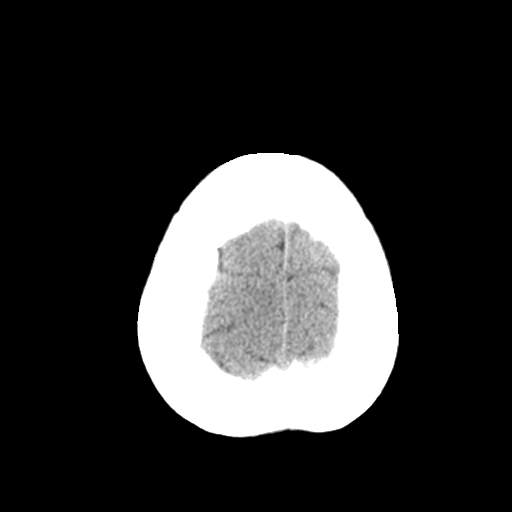

[Series 4: coronal soft tissue · coronal · 0.29mm/px · 3 of 57 slices shown]
[im 19/57  brain]
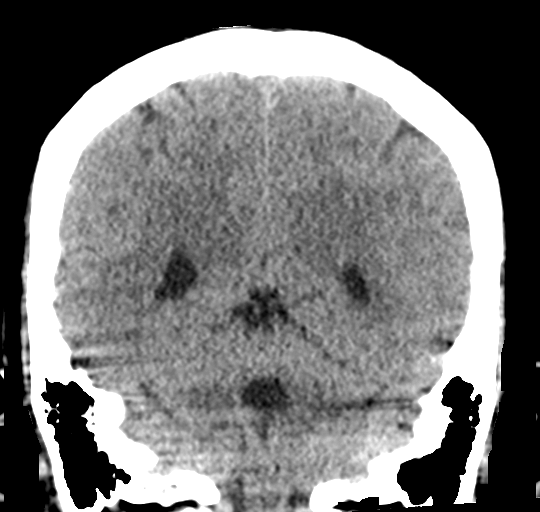
[im 25/57  brain]
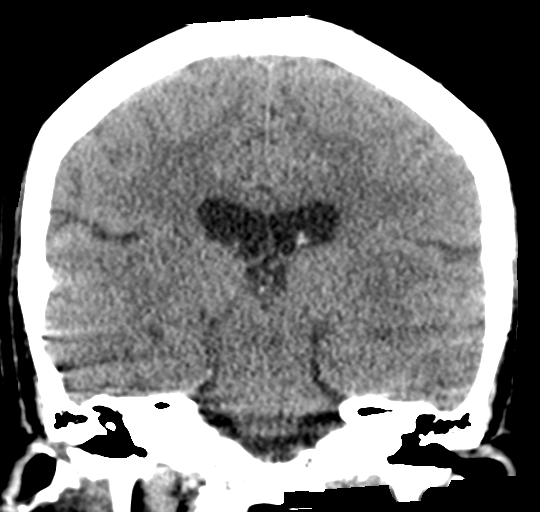
[im 32/57  brain]
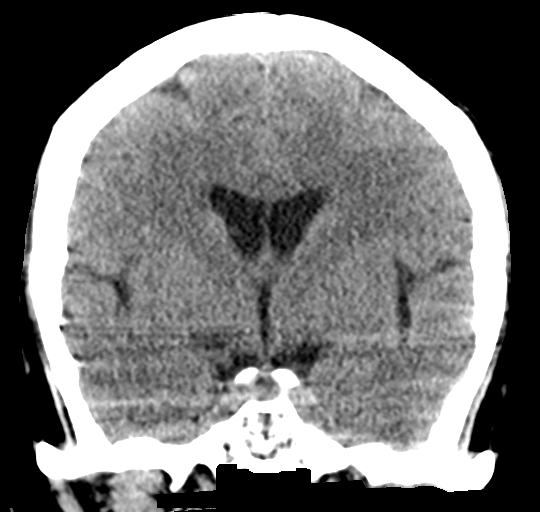

[Series 7: sagittal soft tissue · sagittal · 0.29mm/px · 3 of 52 slices shown]
[im 18/52  brain]
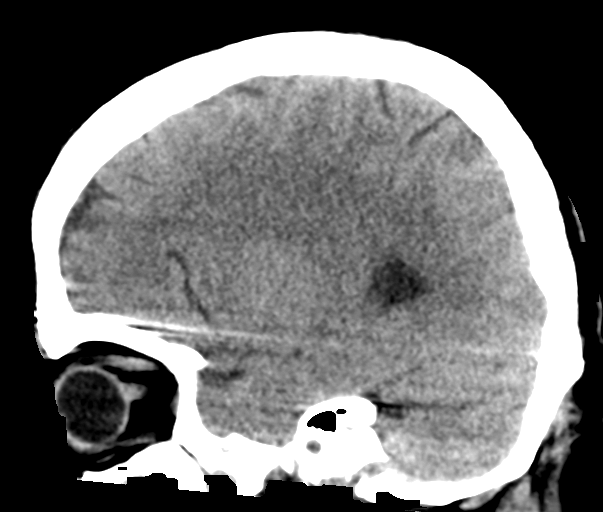
[im 26/52  brain]
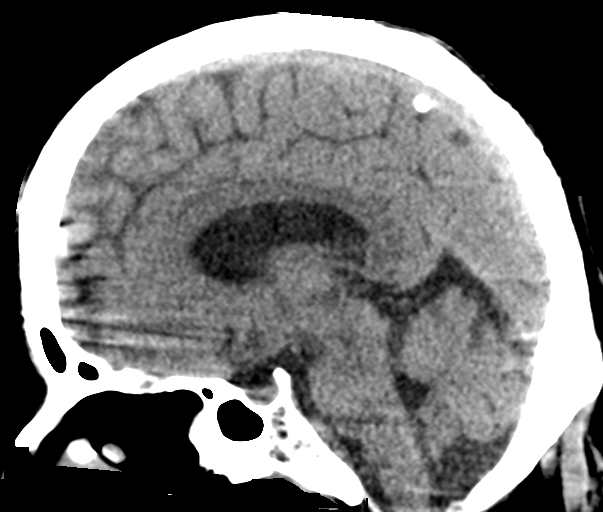
[im 35/52  brain]
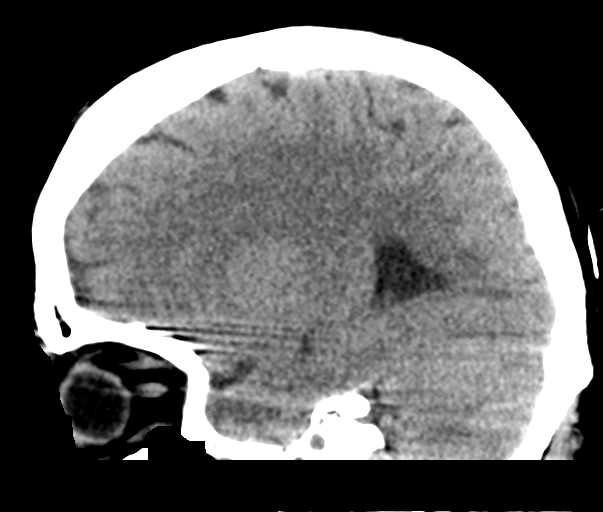

[16 of 47 positions shown; findings below may reference images not displayed]

FINDINGS: Brain: No acute intracranial abnormality. Specifically, no
hemorrhage, hydrocephalus, mass lesion, acute infarction, or
significant intracranial injury.

Vascular: No hyperdense vessel or unexpected calcification.

Skull: Scattered lucent foci within the skull, stable.

Sinuses/Orbits: Opacified left maxillary sinus and adjacent left
ethmoid air cells.

Other: None
IMPRESSION: No acute intracranial abnormality.

Scattered lucent areas within the skull are stable since prior study
compatible with patient's known multiple myeloma.

## 2018-07-04 MED ORDER — BUTALBITAL-APAP-CAFFEINE 50-325-40 MG PO TABS: 1.0000 | ORAL_TABLET | Freq: Four times a day (QID) | ORAL | 0 refills | Status: AC | PRN

## 2018-07-04 MED ORDER — DIPHENHYDRAMINE HCL 50 MG/ML IJ SOLN
25.0000 mg | Freq: Once | INTRAMUSCULAR | Status: AC
  Administered 2018-07-04: 25 mg via INTRAVENOUS
  Filled 2018-07-04: qty 1

## 2018-07-04 MED ORDER — SODIUM CHLORIDE 0.9 % IV SOLN
1000.0000 mL | Freq: Once | INTRAVENOUS | Status: AC
  Administered 2018-07-04: 1000 mL via INTRAVENOUS

## 2018-07-04 MED ORDER — KETOROLAC TROMETHAMINE 30 MG/ML IJ SOLN
30.0000 mg | Freq: Once | INTRAMUSCULAR | Status: AC
  Administered 2018-07-04: 30 mg via INTRAVENOUS
  Filled 2018-07-04: qty 1

## 2018-07-04 MED ORDER — METOCLOPRAMIDE HCL 5 MG/ML IJ SOLN
20.0000 mg | Freq: Once | INTRAVENOUS | Status: DC
  Filled 2018-07-04 (×2): qty 4

## 2018-07-04 NOTE — ED Provider Notes (Signed)
[facility] Emergency Department Provider Note   ____________________________________________    I have reviewed the triage vital signs and the nursing notes.   HISTORY  Chief Complaint Headache    HPI Ruth Walters is a 52 y.o. female history of multiple myeloma, diabetes who presents with complaints of headache.  Patient reports several days of headache which she describes as global and throbbing primarily in the posterior.  Denies neuro deficits.  No acute changes in vision.  No fevers or chills.  Has taken Tylenol with some improvement.  Positive nausea, some light sensitivity.   Past Medical History:  Diagnosis Date  . Cancer ([place])    Multiple Myleoma  . Diabetes mellitus without complication ([place])   . Low blood pressure     Patient Active Problem List   Diagnosis Date Noted  . Hypotension 04/27/2018  . Intractable nausea and vomiting 06/12/2017  . Chest pain 06/12/2017  . Sepsis ([place]) 02/14/2016  . HCAP (healthcare-associated pneumonia) 02/14/2016  . Multiple myeloma ([place]) 02/14/2016  . Type 2 diabetes mellitus ([place]) 02/14/2016    Past Surgical History:  Procedure Laterality Date  . ABDOMINAL HYSTERECTOMY      Prior to Admission medications   Medication Sig Start Date End Date Taking? Authorizing Provider  aspirin EC 81 MG tablet Take 81 mg by mouth daily. 11/13/14   [provider]  butalbital-acetaminophen-caffeine (FIORICET, ESGIC) [id number] MG tablet Take 1-2 tablets by mouth every 6 (six) hours as needed for headache. 07/04/18 07/04/19  Lavonia Drafts, MD  glipiZIDE (GLUCOTROL) 10 MG tablet Take 1 tablet (10 mg total) by mouth 2 (two) times daily before a meal. Patient taking differently: Take 10 mg by mouth daily before breakfast.  06/15/17   Loletha Grayer, MD  insulin lispro (HUMALOG KWIKPEN) 100 UNIT/ML KiwkPen Inject 5 Units into the skin daily before supper. Patient has not started taking this but has Humalog  insulin pen at home but patient and family did not know how to use it 04/06/18 07/05/18  [provider]  insulin NPH Human (HUMULIN N,NOVOLIN N) 100 UNIT/ML injection Inject 20 Units into the skin 2 (two) times daily before a meal.    [provider]  loratadine (CLARITIN) 10 MG tablet Take 10 mg by mouth daily.    [provider]  metFORMIN (GLUMETZA) 1000 MG (MOD) 24 hr tablet Take 1 tablet (1,000 mg total) by mouth daily with breakfast. Patient taking differently: Take 1,000 mg by mouth 2 (two) times daily with a meal.  06/15/17 02/25/19  Loletha Grayer, MD  Multiple Vitamin (MULTIVITAMIN WITH MINERALS) TABS tablet Take 1 tablet by mouth daily. 06/15/17   Loletha Grayer, MD  polyethylene glycol (MIRALAX / GLYCOLAX) packet Take 17 g by mouth daily as needed.    [provider]  senna (SENOKOT) 8.6 MG TABS tablet Take 2 tablets by mouth at bedtime.    [provider]     Allergies Patient has no known allergies.  Family History  Problem Relation Age of Onset  . Leukemia Sister   . Hyperlipidemia Mother     Social History Social History   Tobacco Use  . Smoking status: Never Smoker  . Smokeless tobacco: Never Used  Substance Use Topics  . Alcohol use: No    Alcohol/week: 0.0 oz  . Drug use: No    Review of Systems  Constitutional: No fever/chills Eyes: As above ENT: No sore throat. Cardiovascular: Denies chest pain. Respiratory: Denies shortness of breath.  Gastrointestinal: No abdominal pain.  No nausea, no vomiting.   Genitourinary: Negative for dysuria. Musculoskeletal: Negative for back pain. Skin: Negative for rash. Neurological: As above   ____________________________________________   PHYSICAL EXAM:  VITAL SIGNS: ED Triage Vitals [07/04/18 1155]  Enc Vitals Group     BP 98/60     Pulse Rate 88     Resp 18     Temp 98.6 F (37 C)     Temp Source Oral     SpO2 97 %     Weight 61.2 kg (135 lb)     Height 1.6 m  (5' 3" )     Head Circumference      Peak Flow      Pain Score 6     Pain Loc      Pain Edu?      Excl. in [place]?     Constitutional: Alert and oriented.  Eyes: Conjunctivae are normal.  Head: Atraumatic Nose: No congestion/rhinnorhea. Mouth/Throat: Mucous membranes are moist.   Neck:  Painless ROM, no meningismus Cardiovascular: Normal rate, regular rhythm.   Good peripheral circulation. Respiratory: Normal respiratory effort.  No retractions. Gastrointestinal: Soft and nontender. No distention.   Musculoskeletal: No lower extremity tenderness nor edema.  Warm and well perfused.  Normal strength in all extremities neurologic:  Normal speech and language. No gross focal neurologic deficits are appreciated.  Cranial nerves II through XII are normal Skin:  Skin is warm, dry and intact. No rash noted. Psychiatric: Mood and affect are normal. Speech and behavior are normal.  ____________________________________________   LABS (all labs ordered are listed, but only abnormal results are displayed)  Labs Reviewed - No data to display ____________________________________________  EKG  None ____________________________________________  RADIOLOGY  CT head unremarkable ____________________________________________   PROCEDURES  Procedure(s) performed: No  Procedures   Critical Care performed: No ____________________________________________   INITIAL IMPRESSION / ASSESSMENT AND PLAN / ED COURSE  Pertinent labs & imaging results that were available during my care of the patient were reviewed by me and considered in my medical decision making (see chart for details).  Patient presents with complaints of headache over last several days.  Denies a history of migraines.  No trauma.  No blood thinners.  No neuro deficits.  Will obtain CT head and give headache cocktail and reevaluate  CT head unchanged from prior  Patient feeling significantly better after IV Reglan, IV Benadryl  and IV Toradol, do not feel further work-up is indicated at this time I will ask her to follow-up closely with her PCP    ____________________________________________   FINAL CLINICAL IMPRESSION(S) / ED DIAGNOSES  Final diagnoses:  Acute nonintractable headache, unspecified headache type        Note:  This document was prepared using Dragon voice recognition software and may include unintentional dictation errors.    Lavonia Drafts, MD 07/04/18 [phone]

## 2018-07-04 NOTE — ED Triage Notes (Signed)
Pt reports that she developed a headache, last night with nausea. She states that light and sound make it worse. She states that she tool tylenol and it helped a little bit.

## 2018-07-05 ENCOUNTER — Other Ambulatory Visit: Payer: Self-pay

## 2018-07-05 ENCOUNTER — Emergency Department
Admission: EM | Admit: 2018-07-05 | Discharge: 2018-07-05 | Disposition: A | Discharge status: Home / Self Care | Payer: Self-pay | Attending: Emergency Medicine | Admitting: Emergency Medicine

## 2018-07-05 DIAGNOSIS — R51 Headache: Secondary | ICD-10-CM | POA: Insufficient documentation

## 2018-07-05 DIAGNOSIS — Z794 Long term (current) use of insulin: Secondary | ICD-10-CM | POA: Insufficient documentation

## 2018-07-05 DIAGNOSIS — Z7982 Long term (current) use of aspirin: Secondary | ICD-10-CM | POA: Insufficient documentation

## 2018-07-05 DIAGNOSIS — R519 Headache, unspecified: Secondary | ICD-10-CM

## 2018-07-05 DIAGNOSIS — R739 Hyperglycemia, unspecified: Secondary | ICD-10-CM

## 2018-07-05 DIAGNOSIS — E1165 Type 2 diabetes mellitus with hyperglycemia: Secondary | ICD-10-CM | POA: Insufficient documentation

## 2018-07-05 DIAGNOSIS — N390 Urinary tract infection, site not specified: Secondary | ICD-10-CM | POA: Insufficient documentation

## 2018-07-05 LAB — CBC
HCT: 34.5 % — ABNORMAL LOW (ref 35.0–47.0)
HEMOGLOBIN: 11.5 g/dL — AB (ref 12.0–16.0)
MCH: 27.8 pg (ref 26.0–34.0)
MCHC: 33.3 g/dL (ref 32.0–36.0)
MCV: 83.7 fL (ref 80.0–100.0)
Platelets: 310 10*3/uL (ref 150–440)
RBC: 4.12 MIL/uL (ref 3.80–5.20)
RDW: 16 % — ABNORMAL HIGH (ref 11.5–14.5)
WBC: 15.2 10*3/uL — ABNORMAL HIGH (ref 3.6–11.0)

## 2018-07-05 LAB — BASIC METABOLIC PANEL
Anion gap: 9 (ref 5–15)
BUN: 17 mg/dL (ref 6–20)
CHLORIDE: 104 mmol/L (ref 98–111)
CO2: 24 mmol/L (ref 22–32)
Calcium: 8.7 mg/dL — ABNORMAL LOW (ref 8.9–10.3)
Creatinine, Ser: 0.68 mg/dL (ref 0.44–1.00)
GFR calc non Af Amer: >60 mL/min (ref >60)
Glucose, Bld: 351 mg/dL — ABNORMAL HIGH (ref 70–99)
POTASSIUM: 3.8 mmol/L (ref 3.5–5.1)
SODIUM: 137 mmol/L (ref 135–145)

## 2018-07-05 LAB — URINALYSIS, COMPLETE (UACMP) WITH MICROSCOPIC
Bilirubin Urine: NEGATIVE
Hgb urine dipstick: NEGATIVE
Ketones, ur: 20 mg/dL — AB
NITRITE: NEGATIVE
PH: 6 (ref 5.0–8.0)
PROTEIN: NEGATIVE mg/dL
Specific Gravity, Urine: 1.017 (ref 1.005–1.030)

## 2018-07-05 LAB — GLUCOSE, CAPILLARY
GLUCOSE-CAPILLARY: 249 mg/dL — AB (ref 70–99)
Glucose-Capillary: 273 mg/dL — ABNORMAL HIGH (ref 70–99)

## 2018-07-05 MED ORDER — AMOXICILLIN-POT CLAVULANATE 875-125 MG PO TABS
1.0000 | ORAL_TABLET | Freq: Once | ORAL | Status: AC
  Administered 2018-07-05: 1 via ORAL

## 2018-07-05 MED ORDER — METOCLOPRAMIDE HCL 5 MG/ML IJ SOLN
10.0000 mg | Freq: Once | INTRAMUSCULAR | Status: AC
  Administered 2018-07-05: 10 mg via INTRAVENOUS
  Filled 2018-07-05: qty 2

## 2018-07-05 MED ORDER — KETOROLAC TROMETHAMINE 30 MG/ML IJ SOLN
15.0000 mg | Freq: Once | INTRAMUSCULAR | Status: AC
  Administered 2018-07-05: 15 mg via INTRAVENOUS
  Filled 2018-07-05: qty 1

## 2018-07-05 MED ORDER — AMOXICILLIN-POT CLAVULANATE 875-125 MG PO TABS
ORAL_TABLET | ORAL | Status: AC
  Filled 2018-07-05: qty 1

## 2018-07-05 MED ORDER — ONDANSETRON 4 MG PO TBDP
4.0000 mg | ORAL_TABLET | Freq: Once | ORAL | Status: AC
  Administered 2018-07-05: 4 mg via ORAL

## 2018-07-05 MED ORDER — AMOXICILLIN-POT CLAVULANATE 500-125 MG PO TABS: 1.0000 | ORAL_TABLET | Freq: Two times a day (BID) | ORAL | 0 refills | Status: DC

## 2018-07-05 MED ORDER — SODIUM CHLORIDE 0.9 % IV BOLUS
1000.0000 mL | Freq: Once | INTRAVENOUS | Status: AC
  Administered 2018-07-05: 1000 mL via INTRAVENOUS

## 2018-07-05 MED ORDER — ONDANSETRON 4 MG PO TBDP
ORAL_TABLET | ORAL | Status: AC
  Filled 2018-07-05: qty 1

## 2018-07-05 MED ORDER — INSULIN ASPART 100 UNIT/ML ~~LOC~~ SOLN
3.0000 [IU] | Freq: Once | SUBCUTANEOUS | Status: AC
  Administered 2018-07-05: 3 [IU] via INTRAVENOUS
  Filled 2018-07-05: qty 1

## 2018-07-05 NOTE — ED Triage Notes (Signed)
Pt brought in by [facility] for migraine for few days hx of the same. States was seen here yesterday for the same and discharged home. States headache not improved.

## 2018-07-05 NOTE — ED Notes (Signed)
Ruth Walters, Medical interpreter at bedside with nurse to explain discharge instructions and prescriptions. Patient verbalized understanding of instructions and follow-up care. Ambulatory to lobby with steady gait and NAD noted.

## 2018-07-05 NOTE — ED Notes (Signed)
Pt actively vomiting in waiting room, med given per md order.

## 2018-07-05 NOTE — ED Provider Notes (Signed)
[facility] Emergency Department Provider Note   ____________________________________________   First MD Initiated Contact with Patient 07/05/18 [phone]     (approximate)  I have reviewed the triage vital signs and the nursing notes.   HISTORY  Chief Complaint Migraine  Spanish interpreter utilized  HPI Ruth Walters is a 52 y.o. female with a history of diabetes, prior multiple myeloma.  Patient reports that for about 1 week now she is headed off and on headache.  It is primarily located in the front of her scalp.  Is been going for several days.  No numbness or weakness with it.  No trouble speaking.  No vision changes.  Reports that seem better when she left the hospital yesterday, got home and the symptoms seem to start to come back.  She took a Tylenol tablet that helped somewhat and she was able to rest last night.  This morning she reports she continues to have a moderate throbbing headache across the front of the scalp.  She has not yet utilized the prescription she was given yesterday, reports that she got home late and could not get it filled yet.  No abdominal pain.  No fevers or chills.  Reports decreased appetite Past Medical History:  Diagnosis Date  . Cancer ([place])    Multiple Myleoma  . Diabetes mellitus without complication ([place])   . Low blood pressure     Patient Active Problem List   Diagnosis Date Noted  . Hypotension 04/27/2018  . Intractable nausea and vomiting 06/12/2017  . Chest pain 06/12/2017  . Sepsis ([place]) 02/14/2016  . HCAP (healthcare-associated pneumonia) 02/14/2016  . Multiple myeloma ([place]) 02/14/2016  . Type 2 diabetes mellitus ([place]) 02/14/2016    Past Surgical History:  Procedure Laterality Date  . ABDOMINAL HYSTERECTOMY      Prior to Admission medications   Medication Sig Start Date End Date Taking? Authorizing Provider  amoxicillin-clavulanate (AUGMENTIN) 500-125 MG tablet Take 1 tablet (500 mg total) by  mouth 2 (two) times daily. 07/05/18   Delman Kitten, MD  aspirin EC 81 MG tablet Take 81 mg by mouth daily. 11/13/14   [provider]  butalbital-acetaminophen-caffeine (FIORICET, ESGIC) [phone] MG tablet Take 1-2 tablets by mouth every 6 (six) hours as needed for headache. 07/04/18 07/04/19  Lavonia Drafts, MD  glipiZIDE (GLUCOTROL) 10 MG tablet Take 1 tablet (10 mg total) by mouth 2 (two) times daily before a meal. Patient taking differently: Take 10 mg by mouth daily before breakfast.  06/15/17   Loletha Grayer, MD  insulin lispro (HUMALOG KWIKPEN) 100 UNIT/ML KiwkPen Inject 5 Units into the skin daily before supper. Patient has not started taking this but has Humalog insulin pen at home but patient and family did not know how to use it 04/06/18 07/05/18  [provider]  insulin NPH Human (HUMULIN N,NOVOLIN N) 100 UNIT/ML injection Inject 20 Units into the skin 2 (two) times daily before a meal.    [provider]  loratadine (CLARITIN) 10 MG tablet Take 10 mg by mouth daily.    [provider]  metFORMIN (GLUMETZA) 1000 MG (MOD) 24 hr tablet Take 1 tablet (1,000 mg total) by mouth daily with breakfast. Patient taking differently: Take 1,000 mg by mouth 2 (two) times daily with a meal.  06/15/17 02/25/19  Loletha Grayer, MD  Multiple Vitamin (MULTIVITAMIN WITH MINERALS) TABS tablet Take 1 tablet by mouth daily. 06/15/17   Loletha Grayer, MD  polyethylene glycol (MIRALAX / Floria Raveling) packet  Take 17 g by mouth daily as needed.    [provider]  senna (SENOKOT) 8.6 MG TABS tablet Take 2 tablets by mouth at bedtime.    [provider]    Allergies Patient has no known allergies.  Family History  Problem Relation Age of Onset  . Leukemia Sister   . Hyperlipidemia Mother     Social History Social History   Tobacco Use  . Smoking status: Never Smoker  . Smokeless tobacco: Never Used  Substance Use Topics  . Alcohol use: No     Alcohol/week: 0.0 oz  . Drug use: No    Review of Systems Constitutional: No fever/chills Eyes: No visual changes. ENT: No sore throat. Cardiovascular: Denies chest pain. Respiratory: Denies shortness of breath. Gastrointestinal: No abdominal pain.  No nausea, no vomiting.  No diarrhea.  No constipation. Genitourinary: Reports she is noticed some slight feeling of burning   with urination for a couple of days time.  No abnormal odor or dark urine.  No trouble urinating. Musculoskeletal: Negative for back pain. Skin: Negative for rash. Neurological: Negative for focal weakness or numbness.    ____________________________________________   PHYSICAL EXAM:  VITAL SIGNS: ED Triage Vitals [07/05/18 0315]  Enc Vitals Group     BP (!) 100/49     Pulse Rate 94     Resp 20     Temp 98.9 F (37.2 C)     Temp Source Oral     SpO2 100 %     Weight 135 lb (61.2 kg)     Height      Head Circumference      Peak Flow      Pain Score 10     Pain Loc      Pain Edu?      Excl. in [place]?     Constitutional: Alert and oriented. Well appearing and in no acute distress.  Patient is resting on entry, arouses upon being spoken to. Eyes: Conjunctivae are normal. Head: Atraumatic. Nose: No congestion/rhinnorhea.  No meningismus. Mouth/Throat: Mucous membranes are just slightly dry. Neck: No stridor.  Tympanic membranes normal on the left, right tympanic membrane appears to have some slight scarring but no evidence of erythema or bulging. Cardiovascular: Normal rate, regular rhythm. Grossly normal heart sounds.  Good peripheral circulation. Respiratory: Normal respiratory effort.  No retractions. Lungs CTAB. Gastrointestinal: Soft and nontender. No distention. Musculoskeletal: No lower extremity tenderness nor edema. Neurologic:  Normal speech and language. No gross focal neurologic deficits are appreciated.  Skin:  Skin is warm, dry and intact. No rash noted. Psychiatric: Mood and affect are  normal to very calm. Speech and behavior are normal.  ____________________________________________   LABS (all labs ordered are listed, but only abnormal results are displayed)  Labs Reviewed  CBC - Abnormal; Notable for the following components:      Result Value   WBC 15.2 (*)    Hemoglobin 11.5 (*)    HCT 34.5 (*)    RDW 16.0 (*)    All other components within normal limits  BASIC METABOLIC PANEL - Abnormal; Notable for the following components:   Glucose, Bld 351 (*)    Calcium 8.7 (*)    All other components within normal limits  URINALYSIS, COMPLETE (UACMP) WITH MICROSCOPIC - Abnormal; Notable for the following components:   Color, Urine YELLOW (*)    APPearance CLEAR (*)    Glucose, UA >=500 (*)    Ketones, ur 20 (*)  Leukocytes, UA SMALL (*)    Bacteria, UA MANY (*)    All other components within normal limits  GLUCOSE, CAPILLARY - Abnormal; Notable for the following components:   Glucose-Capillary 273 (*)    All other components within normal limits  GLUCOSE, CAPILLARY - Abnormal; Notable for the following components:   Glucose-Capillary 249 (*)    All other components within normal limits  CBG MONITORING, ED  CBG MONITORING, ED  CBG MONITORING, ED  CBG MONITORING, ED   ____________________________________________  EKG   ____________________________________________  RADIOLOGY  Patient had a CT of the head yesterday, reviewed results which is negative for acute ____________________________________________   PROCEDURES  Procedure(s) performed: None  Procedures  Critical Care performed: No  ____________________________________________   INITIAL IMPRESSION / ASSESSMENT AND PLAN / ED COURSE  Pertinent labs & imaging results that were available during my care of the patient were reviewed by me and considered in my medical decision making (see chart for details).  Rather an insidious headache, at least 7 days now.  Afebrile.  No meningismus.  No  alteration in mental status.  Reassuring clinical examination without focal neuro abnormality.  Reports a frontal throbbing discomfort.  No visual changes.  No tenderness over the temporal regions and has strongly normal temporal artery pulsations.  No interim injury since her head CT yesterday.  Reassuring CT of the head yesterday.  Does however report now that she is having some slight feeling of dysuria.  Given the patient's history of multiple myeloma, dysuria, and re-presentation I plan to hydrate her check labs including urinalysis.  Clinical Course as of Jul 06 1023  Wed Jul 05, 2018  0842 Level for slight leukocytosis, also hyperglycemia with normal anion gap.  No evidence to support DKA or HH NK, however will check urinalysis, give fluids, and anticipate small dose of insulin if hyperglycemia not improving with fluids.   [MQ]    Clinical Course User Index [MQ] Delman Kitten, MD   ----------------------------------------- 10:24 AM on 07/05/2018 -----------------------------------------  Patient feeling improved.  Appears to be resting comfortably, updated patient on plan of care findings and plans for discharge and treatment recommendations via [place] interpreter.  Urinalysis appears consistent with UTI, patient does endorse she is had some dysuria the last couple of days and this seems to likely explain why she may be slightly hyperglycemic at this time, also having headaches.  Treat for UTI, Augmentin.  Follow-up with primary doctor which she identifies in [place].  Return precautions and treatment recommendations and follow-up discussed with the patient who is agreeable with the plan.  ____________________________________________   FINAL CLINICAL IMPRESSION(S) / ED DIAGNOSES  Final diagnoses:  Hyperglycemia  Frontal headache  Urinary tract infection, acute      NEW MEDICATIONS STARTED DURING THIS VISIT:  New Prescriptions   AMOXICILLIN-CLAVULANATE (AUGMENTIN)  500-125 MG TABLET    Take 1 tablet (500 mg total) by mouth 2 (two) times daily.     Note:  This document was prepared using Dragon voice recognition software and may include unintentional dictation errors.     Delman Kitten, MD 07/05/18 1024

## 2018-07-05 NOTE — Discharge Instructions (Addendum)
You have been seen in the Emergency Department (ED) for a headache.    As we have discussed, please follow up with your primary care doctor as soon as possible regarding today?s Emergency Department (ED) visit and your headache symptoms.    Call your doctor or return to the ED if you have a worsening headache, sudden and severe headache, confusion, slurred speech, facial droop, weakness or numbness in any arm or leg, extreme fatigue, vision problems, or other symptoms that concern you.

## 2019-11-23 ENCOUNTER — Emergency Department: Payer: Self-pay

## 2019-11-23 ENCOUNTER — Emergency Department
Admission: EM | Admit: 2019-11-23 | Discharge: 2019-11-23 | Disposition: A | Discharge status: Home / Self Care | Payer: Self-pay | Attending: Emergency Medicine | Admitting: Emergency Medicine

## 2019-11-23 ENCOUNTER — Other Ambulatory Visit: Payer: Self-pay

## 2019-11-23 ENCOUNTER — Encounter: Payer: Self-pay | Admitting: Emergency Medicine

## 2019-11-23 DIAGNOSIS — E119 Type 2 diabetes mellitus without complications: Secondary | ICD-10-CM | POA: Insufficient documentation

## 2019-11-23 DIAGNOSIS — Z20822 Contact with and (suspected) exposure to covid-19: Secondary | ICD-10-CM

## 2019-11-23 DIAGNOSIS — R0789 Other chest pain: Secondary | ICD-10-CM | POA: Insufficient documentation

## 2019-11-23 DIAGNOSIS — I10 Essential (primary) hypertension: Secondary | ICD-10-CM | POA: Insufficient documentation

## 2019-11-23 DIAGNOSIS — Z79899 Other long term (current) drug therapy: Secondary | ICD-10-CM | POA: Insufficient documentation

## 2019-11-23 DIAGNOSIS — R0602 Shortness of breath: Secondary | ICD-10-CM | POA: Insufficient documentation

## 2019-11-23 DIAGNOSIS — Z7982 Long term (current) use of aspirin: Secondary | ICD-10-CM | POA: Insufficient documentation

## 2019-11-23 DIAGNOSIS — Z20828 Contact with and (suspected) exposure to other viral communicable diseases: Secondary | ICD-10-CM | POA: Insufficient documentation

## 2019-11-23 DIAGNOSIS — Z8579 Personal history of other malignant neoplasms of lymphoid, hematopoietic and related tissues: Secondary | ICD-10-CM | POA: Insufficient documentation

## 2019-11-23 DIAGNOSIS — Z794 Long term (current) use of insulin: Secondary | ICD-10-CM | POA: Insufficient documentation

## 2019-11-23 HISTORY — DX: Essential (primary) hypertension: I10

## 2019-11-23 LAB — CBC
HCT: 34.6 % — ABNORMAL LOW (ref 36.0–46.0)
Hemoglobin: 11.6 g/dL — ABNORMAL LOW (ref 12.0–15.0)
MCH: 27.6 pg (ref 26.0–34.0)
MCHC: 33.5 g/dL (ref 30.0–36.0)
MCV: 82.2 fL (ref 80.0–100.0)
Platelets: 305 10*3/uL (ref 150–400)
RBC: 4.21 MIL/uL (ref 3.87–5.11)
RDW: 14.3 % (ref 11.5–15.5)
WBC: 7.3 10*3/uL (ref 4.0–10.5)
nRBC: 0 % (ref 0.0–0.2)

## 2019-11-23 LAB — BASIC METABOLIC PANEL
Anion gap: 8 (ref 5–15)
BUN: 25 mg/dL — ABNORMAL HIGH (ref 6–20)
CO2: 24 mmol/L (ref 22–32)
Calcium: 8.7 mg/dL — ABNORMAL LOW (ref 8.9–10.3)
Chloride: 107 mmol/L (ref 98–111)
Creatinine, Ser: 0.67 mg/dL (ref 0.44–1.00)
GFR calc Af Amer: >60 mL/min (ref >60)
GFR calc non Af Amer: >60 mL/min (ref >60)
Glucose, Bld: 376 mg/dL — ABNORMAL HIGH (ref 70–99)
Potassium: 4.5 mmol/L (ref 3.5–5.1)
Sodium: 139 mmol/L (ref 135–145)

## 2019-11-23 LAB — TROPONIN I (HIGH SENSITIVITY)
Troponin I (High Sensitivity): 3 ng/L (ref <18)
Troponin I (High Sensitivity): 3 ng/L (ref <18)

## 2019-11-23 IMAGING — CT CT ANGIO CHEST
2 of 6 series · 18 of 46 positions shown · IV contrast (APPLIED)
Comparison: Chest x-ray of the same date.

CLINICAL DATA: Suspected PE, intermittent chest pain worsening over
the last week with radiation to the back, associated with shortness
of breath.

EXAM:
CT ANGIOGRAPHY CHEST WITH CONTRAST
TECHNIQUE: Multidetector CT imaging of the chest was performed using the
standard protocol during bolus administration of intravenous
contrast. Multiplanar CT image reconstructions and MIPs were
obtained to evaluate the vascular anatomy.
CONTRAST:  75mL OMNIPAQUE IOHEXOL 350 MG/ML SOLN

[Series 5: thins · axial · 0.59mm/px · z∈[-378,-147]mm · 15 of 253 slices shown]
[im 11/253  lung]
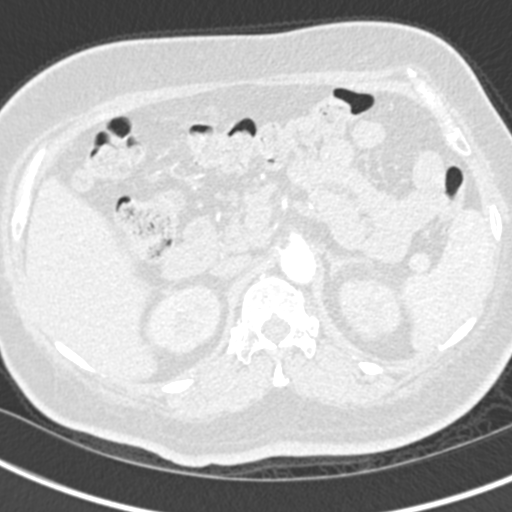
[im 33/253  soft-tissue]
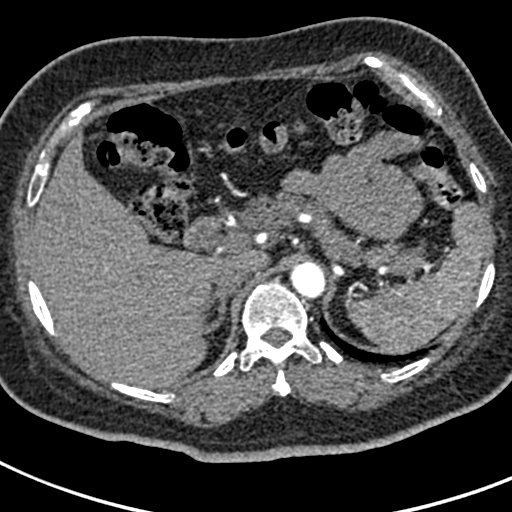
[im 44/253  lung]
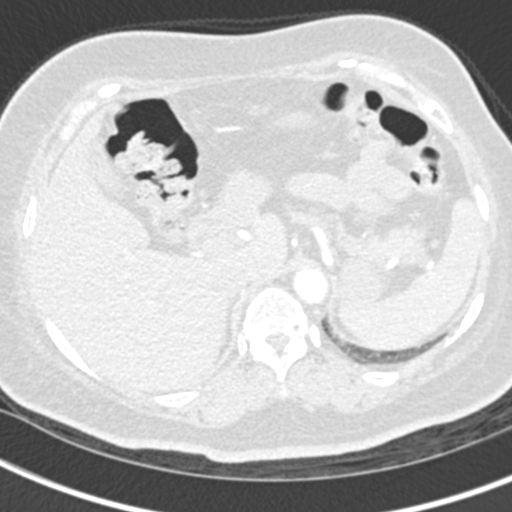
[im 66/253  soft-tissue]
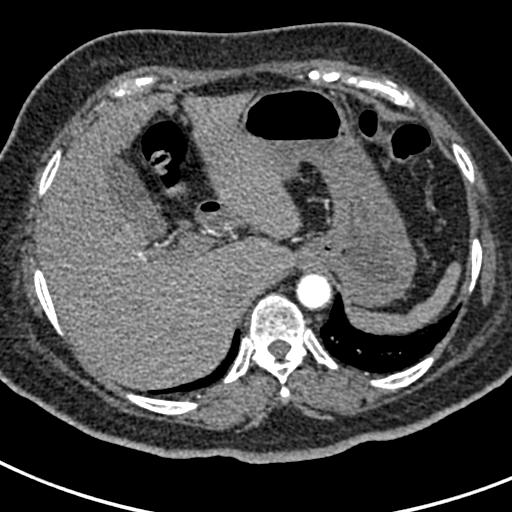
[im 77/253  lung]
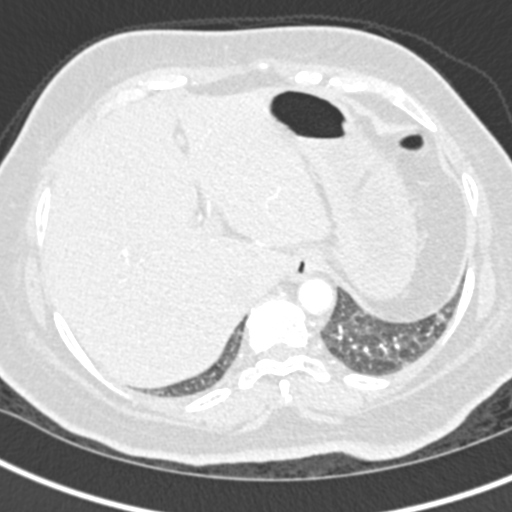
[im 99/253  soft-tissue]
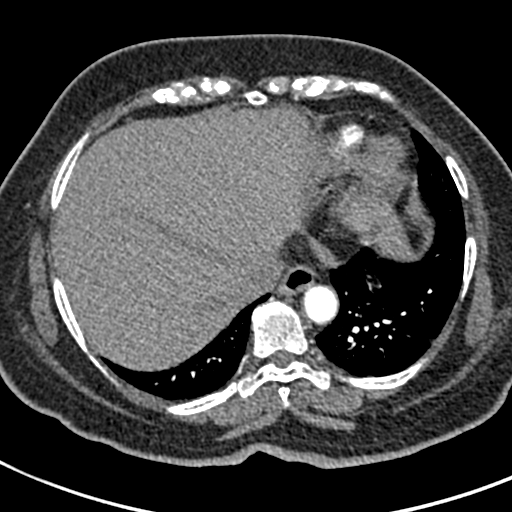
[im 110/253  lung]
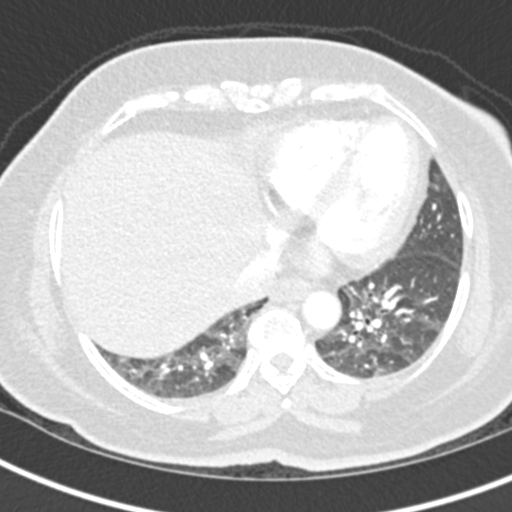
[im 132/253  soft-tissue]
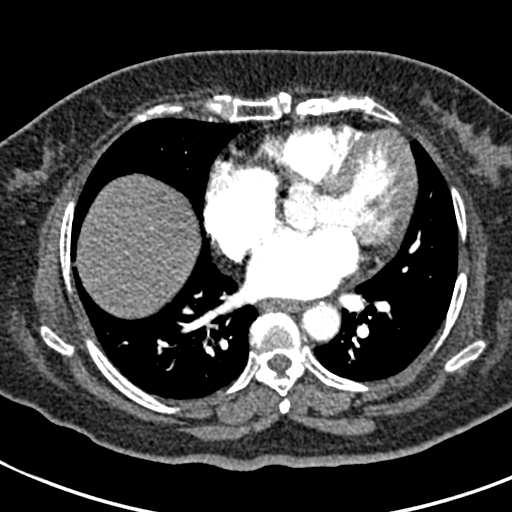
[im 143/253  lung]
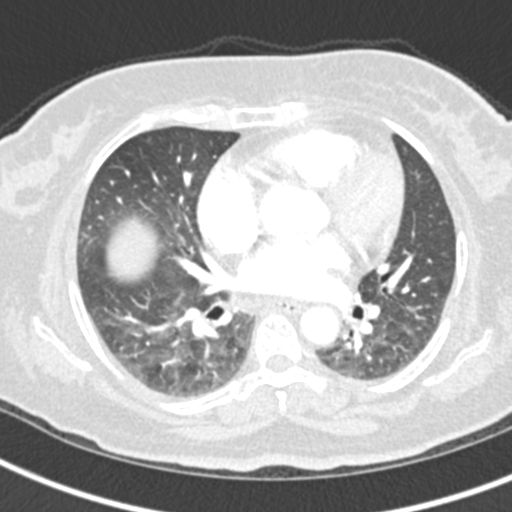
[im 154/253  soft-tissue]
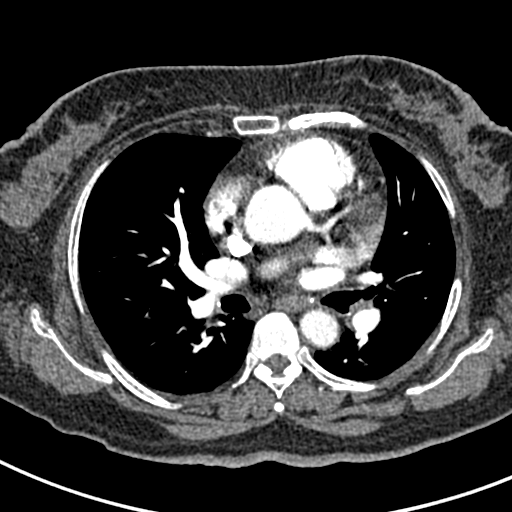
[im 176/253  lung]
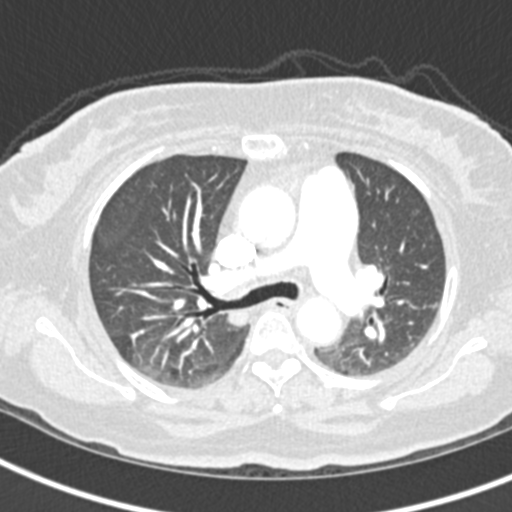
[im 187/253  soft-tissue]
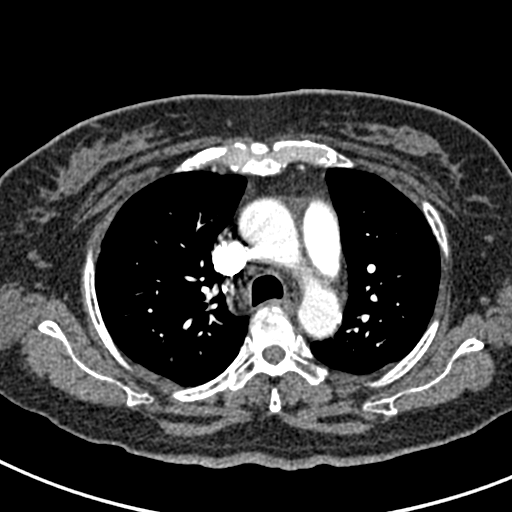
[im 209/253  lung]
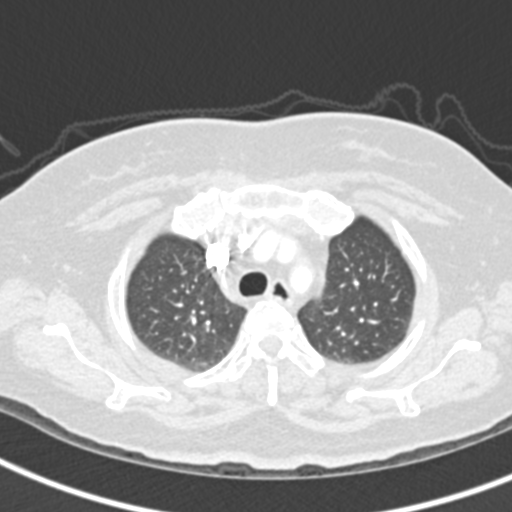
[im 220/253  soft-tissue]
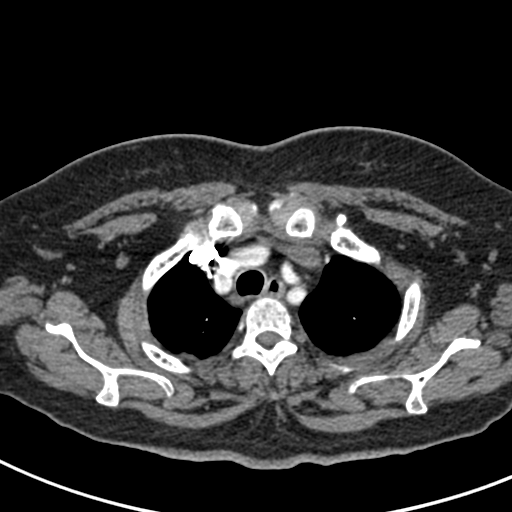
[im 242/253  lung]
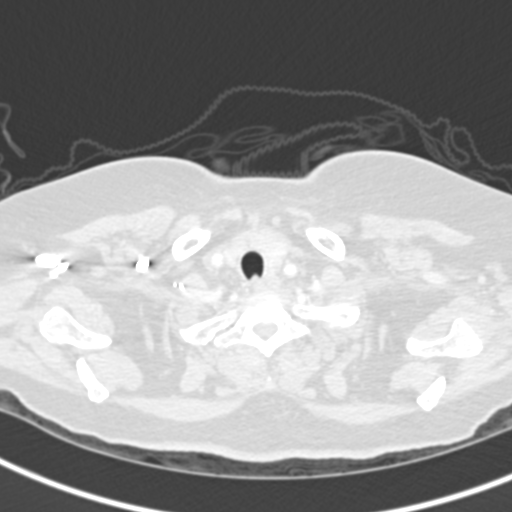

[Series 7: coronal mpr · coronal · 0.59mm/px · 3 of 85 slices shown]
[im 22/85  soft-tissue]
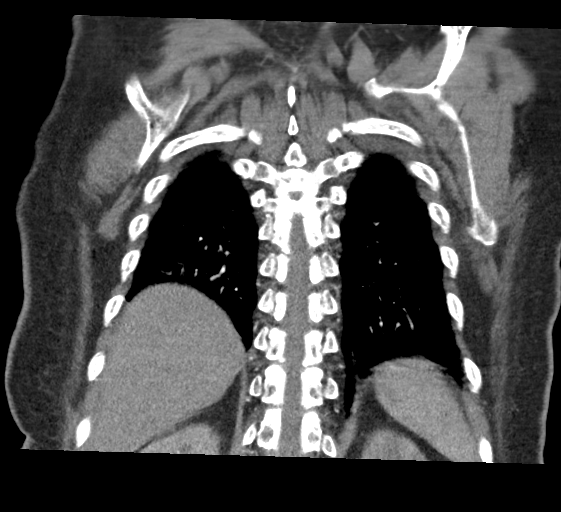
[im 43/85  soft-tissue]
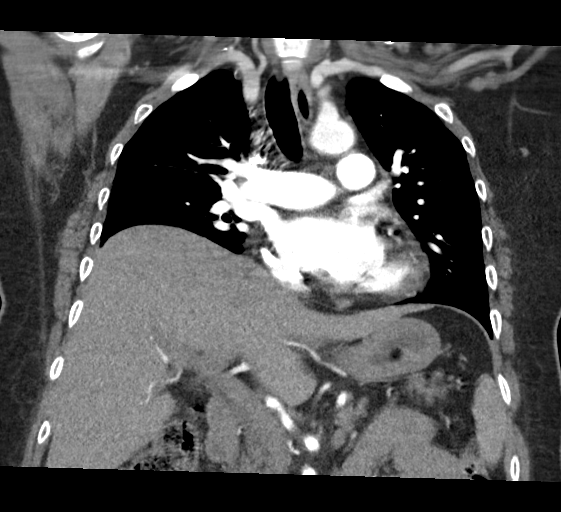
[im 64/85  soft-tissue]
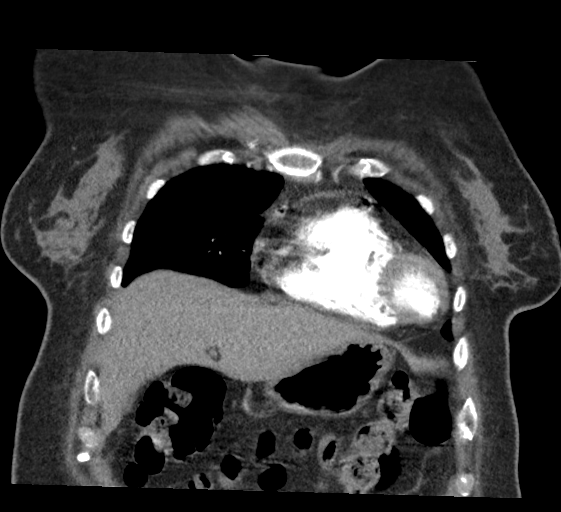

[18 of 46 positions shown; findings below may reference images not displayed]

FINDINGS: Cardiovascular: Scattered calcified and noncalcified atherosclerotic
plaque. Heart size is mild to moderately enlarged without
pericardial effusion. Central pulmonary vasculature measures 3.3 cm.
No signs of acute pulmonary embolism.

Mediastinum/Nodes: No enlarged mediastinal, hilar, or axillary lymph
nodes. Thyroid gland, trachea, and esophagus demonstrate no
significant findings.

Lungs/Pleura: Signs of basilar atelectasis. Airways are patent. No
signs of consolidation, pleural effusion or pneumothorax. Signs of
prior granulomatous disease with calcified granuloma in the left
chest in the lingula.

Upper Abdomen: No acute process in the upper abdomen.

Musculoskeletal: No signs of acute bone finding or evidence of
destructive bone process.

Review of the MIP images confirms the above findings.
IMPRESSION: 1. Negative for pulmonary embolus.
2. Mild to moderate cardiomegaly without pericardial effusion.
3. Mild engorgement of central pulmonary vasculature.
4. Signs of prior granulomatous disease.

Aortic Atherosclerosis ([id number]-[id number].S).

## 2019-11-23 IMAGING — CR DG CHEST 2V
2 series · 2 of 2 positions shown · non-contrast
Comparison: April 27, 2018.

CLINICAL DATA: Chest pain.

EXAM:
CHEST - 2 VIEW

[chest pa]
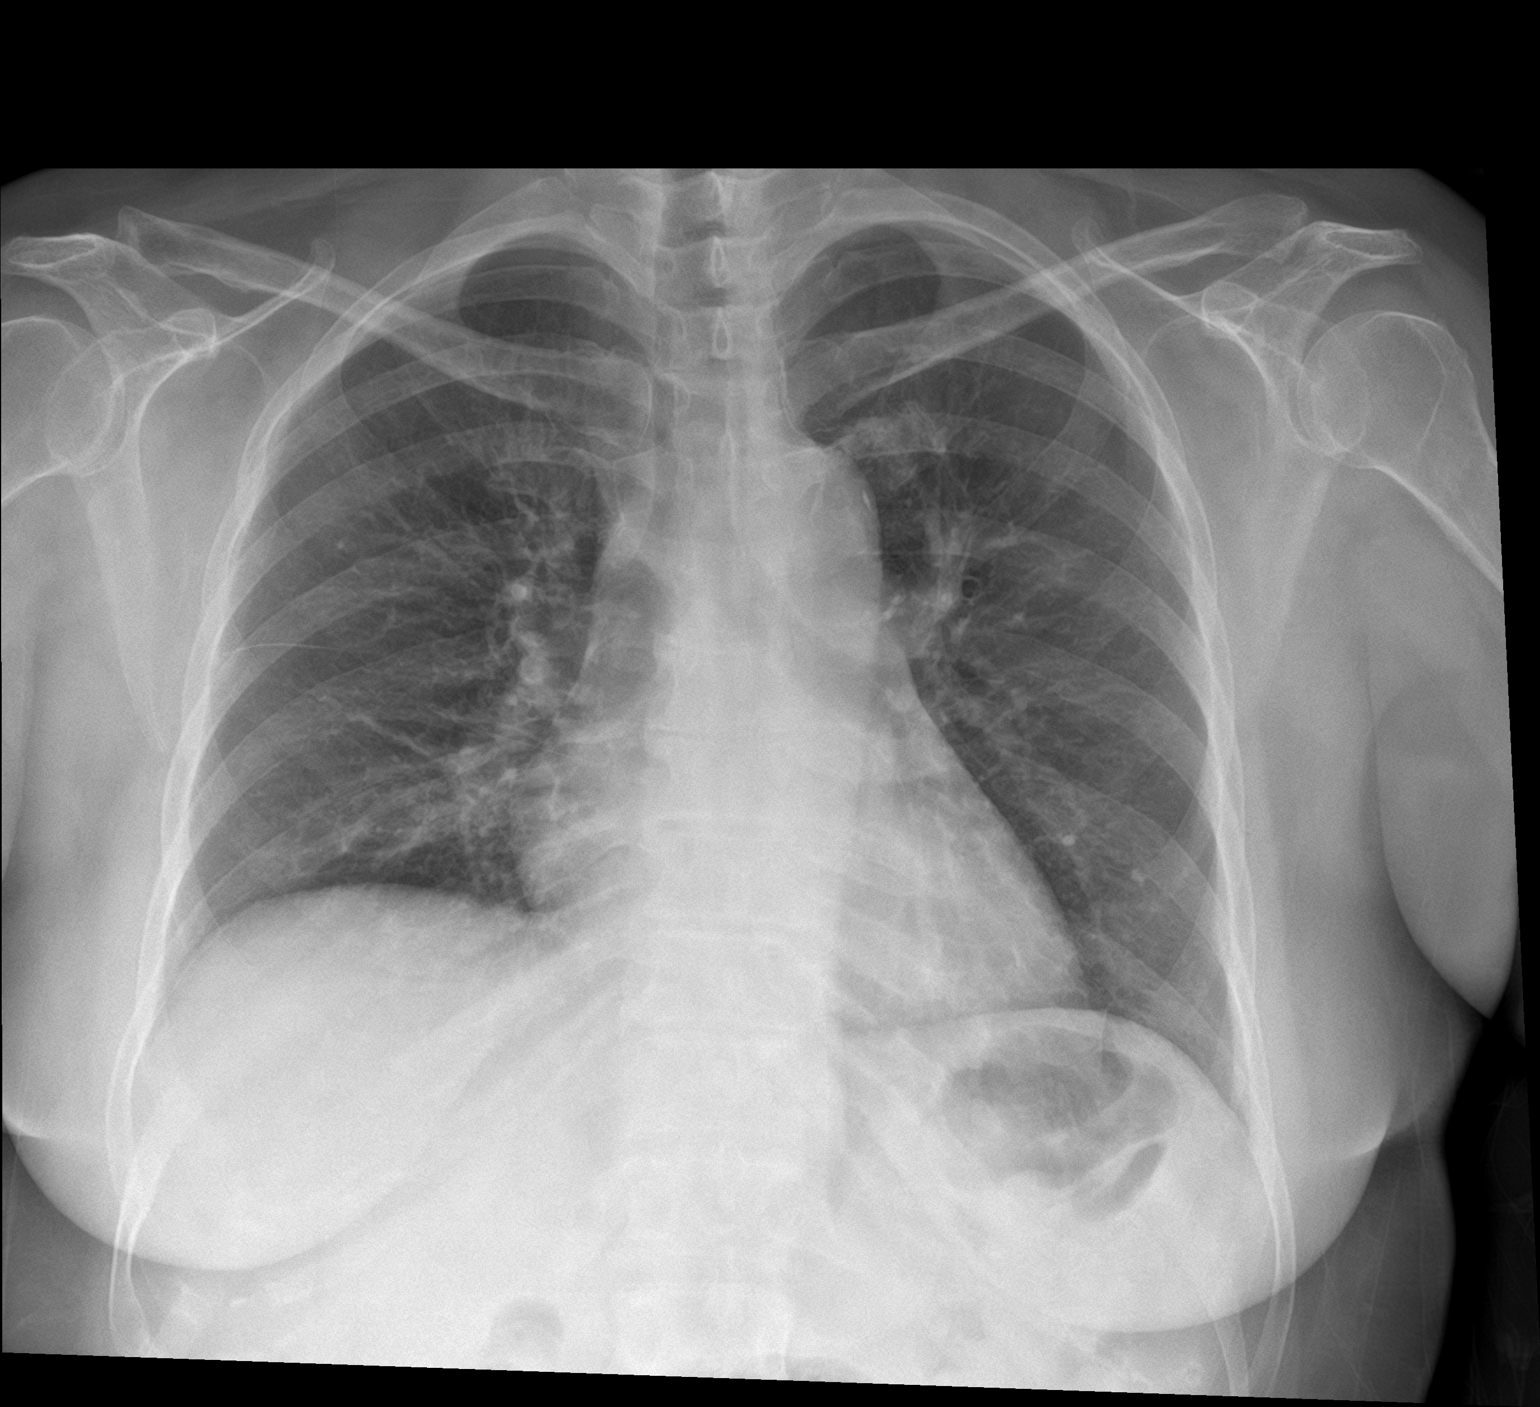

[chest lat]
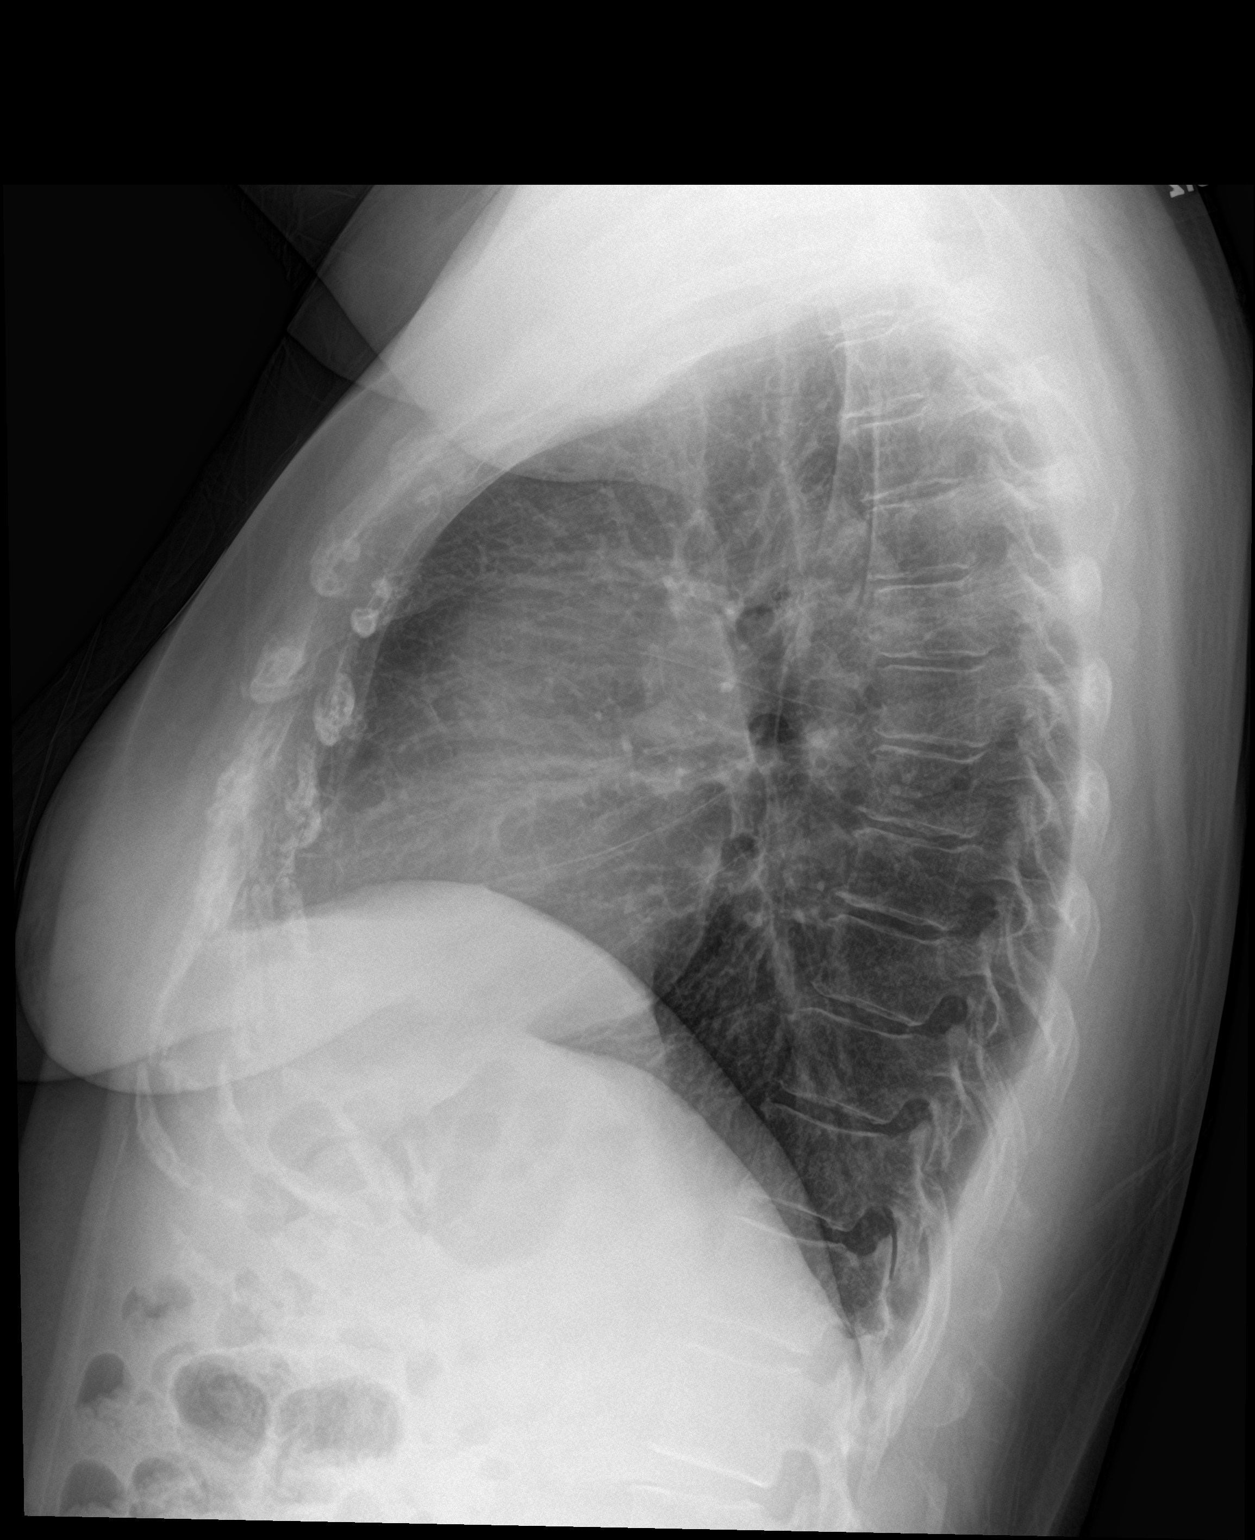

[2 of 2 positions shown; findings below may reference images not displayed]

FINDINGS: The heart size and mediastinal contours are within normal limits.
Both lungs are clear. No pneumothorax or pleural effusion is noted.
The visualized skeletal structures are unremarkable.
IMPRESSION: No active cardiopulmonary disease.

## 2019-11-23 MED ORDER — IOHEXOL 350 MG/ML SOLN
75.0000 mL | Freq: Once | INTRAVENOUS | Status: AC | PRN
  Administered 2019-11-23: 15:00:00 75 mL via INTRAVENOUS
  Filled 2019-11-23: qty 75

## 2019-11-23 MED ORDER — FAMOTIDINE 20 MG PO TABS: 20.0000 mg | ORAL_TABLET | Freq: Two times a day (BID) | ORAL | 0 refills | Status: DC

## 2019-11-23 MED ORDER — SODIUM CHLORIDE 0.9 % IV BOLUS
500.0000 mL | Freq: Once | INTRAVENOUS | Status: AC
  Administered 2019-11-23: 14:00:00 500 mL via INTRAVENOUS

## 2019-11-23 NOTE — ED Provider Notes (Signed)
[facility] Emergency Department Provider Note  ____________________________________________  Time seen: Approximately 4:06 PM  I have reviewed the triage vital signs and the nursing notes.   HISTORY  Chief Complaint Chest Pain  Family member at bedside acted as Spanish interpreter at patient's request.  HPI Ruth Walters is a 53 y.o. female with history of diabetes hypertension and multiple myeloma receiving chemotherapy treatment at [facility] hematology comes to the ED complaining of intermittent chest pain and shortness of breath for the past 3 or 4 days.  No aggravating or alleviating factors, not specifically exertional but it is pleuritic.  Denies fever or chills.  She has a chronic nonproductive cough which is unchanged.  No body aches or sick contacts.  No other acute symptoms.   Pain is in center of chest, radiates to the back, off and on, moderate intensity, described as burning.     Past Medical History:  Diagnosis Date  . Cancer ([place])    Multiple Myleoma  . Diabetes mellitus without complication ([place])   . Hypertension   . Low blood pressure      Patient Active Problem List   Diagnosis Date Noted  . Hypotension 04/27/2018  . Intractable nausea and vomiting 06/12/2017  . Chest pain 06/12/2017  . Sepsis ([place]) 02/14/2016  . HCAP (healthcare-associated pneumonia) 02/14/2016  . Multiple myeloma ([place]) 02/14/2016  . Type 2 diabetes mellitus ([place]) 02/14/2016     Past Surgical History:  Procedure Laterality Date  . ABDOMINAL HYSTERECTOMY       Prior to Admission medications   Medication Sig Start Date End Date Taking? Authorizing Provider  amoxicillin-clavulanate (AUGMENTIN) 500-125 MG tablet Take 1 tablet (500 mg total) by mouth 2 (two) times daily. 07/05/18   Delman Kitten, MD  aspirin EC 81 MG tablet Take 81 mg by mouth daily. 11/13/14   [provider]  famotidine (PEPCID) 20 MG tablet Take 1 tablet (20 mg total) by mouth 2 (two)  times daily. 11/23/19   Carrie Mew, MD  glipiZIDE (GLUCOTROL) 10 MG tablet Take 1 tablet (10 mg total) by mouth 2 (two) times daily before a meal. Patient taking differently: Take 10 mg by mouth daily before breakfast.  06/15/17   Loletha Grayer, MD  insulin lispro (HUMALOG KWIKPEN) 100 UNIT/ML KiwkPen Inject 5 Units into the skin daily before supper. Patient has not started taking this but has Humalog insulin pen at home but patient and family did not know how to use it 04/06/18 07/05/18  [provider]  insulin NPH Human (HUMULIN N,NOVOLIN N) 100 UNIT/ML injection Inject 20 Units into the skin 2 (two) times daily before a meal.    [provider]  loratadine (CLARITIN) 10 MG tablet Take 10 mg by mouth daily.    [provider]  metFORMIN (GLUMETZA) 1000 MG (MOD) 24 hr tablet Take 1 tablet (1,000 mg total) by mouth daily with breakfast. Patient taking differently: Take 1,000 mg by mouth 2 (two) times daily with a meal.  06/15/17 02/25/19  Loletha Grayer, MD  Multiple Vitamin (MULTIVITAMIN WITH MINERALS) TABS tablet Take 1 tablet by mouth daily. 06/15/17   Loletha Grayer, MD  polyethylene glycol (MIRALAX / GLYCOLAX) packet Take 17 g by mouth daily as needed.    [provider]  senna (SENOKOT) 8.6 MG TABS tablet Take 2 tablets by mouth at bedtime.    [provider]     Allergies Patient has no known allergies.   Family History  Problem Relation Age  of Onset  . Leukemia Sister   . Hyperlipidemia Mother     Social History Social History   Tobacco Use  . Smoking status: Never Smoker  . Smokeless tobacco: Never Used  Substance Use Topics  . Alcohol use: No    Alcohol/week: 0.0 standard drinks  . Drug use: No    Review of Systems  Constitutional:   No fever or chills.  ENT:   No sore throat. No rhinorrhea. Cardiovascular: Positive chest pain as above without syncope. Respiratory: Positive shortness of breath and nonproductive  cough. Gastrointestinal:   Negative for abdominal pain, vomiting and diarrhea.  Musculoskeletal:   Negative for focal pain or swelling All other systems reviewed and are negative except as documented above in ROS and HPI.  ____________________________________________   PHYSICAL EXAM:  VITAL SIGNS: ED Triage Vitals  Enc Vitals Group     BP 11/23/19 1130 110/73     Pulse Rate 11/23/19 1130 84     Resp 11/23/19 1130 17     Temp 11/23/19 1130 98.2 F (36.8 C)     Temp Source 11/23/19 1130 Oral     SpO2 11/23/19 1130 100 %     Weight 11/23/19 1126 140 lb (63.5 kg)     Height 11/23/19 1126 _0  (1.499 m)     Head Circumference --      Peak Flow --      Pain Score 11/23/19 1125 8     Pain Loc --      Pain Edu? --      Excl. in [place]? --     Vital signs reviewed, nursing assessments reviewed.   Constitutional:   Alert and oriented. Non-toxic appearance. Eyes:   Conjunctivae are normal. EOMI. PERRL. ENT      Head:   Normocephalic and atraumatic.      Nose:   Wearing a mask.      Mouth/Throat:   Wearing a mask.      Neck:   No meningismus. Full ROM. Hematological/Lymphatic/Immunilogical:   No cervical lymphadenopathy. Cardiovascular:   RRR. Symmetric bilateral radial and DP pulses.  No murmurs. Cap refill less than 2 seconds. Respiratory:   Normal respiratory effort without tachypnea/retractions. Breath sounds are clear and equal bilaterally. No wheezes/rales/rhonchi. Gastrointestinal:   Soft and nontender. Non distended. There is no CVA tenderness.  No rebound, rigidity, or guarding.  Musculoskeletal:   Normal range of motion in all extremities. No joint effusions.  No lower extremity tenderness.  No edema. Neurologic:   Normal speech and language.  Motor grossly intact. No acute focal neurologic deficits are appreciated.  Skin:    Skin is warm, dry and intact. No rash noted.  No petechiae, purpura, or bullae.  ____________________________________________    LABS  (pertinent positives/negatives) (all labs ordered are listed, but only abnormal results are displayed) Labs Reviewed  BASIC METABOLIC PANEL - Abnormal; Notable for the following components:      Result Value   Glucose, Bld 376 (*)    BUN 25 (*)    Calcium 8.7 (*)    All other components within normal limits  CBC - Abnormal; Notable for the following components:   Hemoglobin 11.6 (*)    HCT 34.6 (*)    All other components within normal limits  NOVEL CORONAVIRUS, NAA (HOSP ORDER, SEND-OUT TO REF LAB; TAT 18-24 HRS)  POC URINE PREG, ED  TROPONIN I (HIGH SENSITIVITY)  TROPONIN I (HIGH SENSITIVITY)   ____________________________________________   EKG  Interpreted by  me Normal sinus rhythm rate of 82.  Normal axis and intervals.  Poor R wave progression.  Normal ST segments and T waves.  No ischemic changes.  ____________________________________________    RADIOLOGY  Dg Chest 2 View  Result Date: 11/23/2019 CLINICAL DATA:  Chest pain. EXAM: CHEST - 2 VIEW COMPARISON:  Apr 27, 2018. FINDINGS: The heart size and mediastinal contours are within normal limits. Both lungs are clear. No pneumothorax or pleural effusion is noted. The visualized skeletal structures are unremarkable. IMPRESSION: No active cardiopulmonary disease. Electronically Signed   By: Marijo Conception M.D.   On: 11/23/2019 12:05   Ct Angio Chest Pe W And/or Wo Contrast  Result Date: 11/23/2019 CLINICAL DATA:  Suspected PE, intermittent chest pain worsening over the last week with radiation to the back, associated with shortness of breath. EXAM: CT ANGIOGRAPHY CHEST WITH CONTRAST TECHNIQUE: Multidetector CT imaging of the chest was performed using the standard protocol during bolus administration of intravenous contrast. Multiplanar CT image reconstructions and MIPs were obtained to evaluate the vascular anatomy. CONTRAST:  38m OMNIPAQUE IOHEXOL 350 MG/ML SOLN COMPARISON:  Chest x-ray of the same date. FINDINGS:  Cardiovascular: Scattered calcified and noncalcified atherosclerotic plaque. Heart size is mild to moderately enlarged without pericardial effusion. Central pulmonary vasculature measures 3.3 cm. No signs of acute pulmonary embolism. Mediastinum/Nodes: No enlarged mediastinal, hilar, or axillary lymph nodes. Thyroid gland, trachea, and esophagus demonstrate no significant findings. Lungs/Pleura: Signs of basilar atelectasis. Airways are patent. No signs of consolidation, pleural effusion or pneumothorax. Signs of prior granulomatous disease with calcified granuloma in the left chest in the lingula. Upper Abdomen: No acute process in the upper abdomen. Musculoskeletal: No signs of acute bone finding or evidence of destructive bone process. Review of the MIP images confirms the above findings. IMPRESSION: 1. Negative for pulmonary embolus. 2. Mild to moderate cardiomegaly without pericardial effusion. 3. Mild engorgement of central pulmonary vasculature. 4. Signs of prior granulomatous disease. Aortic Atherosclerosis (ICD10-I70.0). Electronically Signed   By: GZetta BillsM.D.   On: 11/23/2019 15:02    ____________________________________________   PROCEDURES Procedures  ____________________________________________  DIFFERENTIAL DIAGNOSIS   Pneumonia, COVID-19, GERD, pulmonary embolism, non-STEMI  CLINICAL IMPRESSION / ASSESSMENT AND PLAN / ED COURSE  Medications ordered in the ED: Medications  sodium chloride 0.9 % bolus 500 mL (0 mLs Intravenous Stopped 11/23/19 1602)  iohexol (OMNIPAQUE) 350 MG/ML injection 75 mL (75 mLs Intravenous Contrast Given 11/23/19 1443)    Pertinent labs & imaging results that were available during my care of the patient were reviewed by me and considered in my medical decision making (see chart for details).  MCHERYE GAERTNERwas evaluated in Emergency Department on 11/23/2019 for the symptoms described in the history of present illness. She was evaluated in the  context of the global COVID-19 pandemic, which necessitated consideration that the patient might be at risk for infection with the SARS-CoV-2 virus that causes COVID-19. Institutional protocols and algorithms that pertain to the evaluation of patients at risk for COVID-19 are in a state of rapid change based on information released by regulatory bodies including the CDC and federal and state organizations. These policies and algorithms were followed during the patient's care in the ED.   Patient presents with atypical chest pain, intermittent.  Vital signs are normal, initial EKG, labs and chest x-ray are all normal.  Exam is reassuring.  With normal vital signs, PE is unlikely but given her multiple myeloma and chemotherapy history, she will need a  CT scan to rule out.  ----------------------------------------- 4:09 PM on 11/23/2019 -----------------------------------------  CT chest is negative for PE or other acute findings.  Plan to do a trial of Pepcid over the weekend in case the symptoms are due to GERD.  Send a Covid swab today.  Follow-up with her doctors on Monday in 3 days as scheduled.  Return precautions discussed.  2 troponins are both normal, highly doubt ACS dissection carditis.      ____________________________________________   FINAL CLINICAL IMPRESSION(S) / ED DIAGNOSES    Final diagnoses:  Atypical chest pain  Suspected COVID-19 virus infection     ED Discharge Orders         Ordered    famotidine (PEPCID) 20 MG tablet  2 times daily     11/23/19 1605          Portions of this note were generated with dragon dictation software. Dictation errors may occur despite best attempts at proofreading.   Carrie Mew, MD 11/23/19 1610

## 2019-11-23 NOTE — Discharge Instructions (Signed)
Your EKG, blood tests, and CT scan of the chest were all okay today.  Try taking famotidine over the weekend to see if this helps with your symptoms and follow-up with your doctors on Monday.  We sent a Covid test today which should be resulted in Mychart in the next 1 or 2 days.

## 2019-11-23 NOTE — ED Notes (Signed)
[employer] interpreter at bedside at this time.

## 2019-11-23 NOTE — ED Notes (Signed)
PT does not want to sign d/c due to poor eyesight. PT denies any further questions. Witnessed by [employer].

## 2019-11-23 NOTE — ED Triage Notes (Signed)
Pt in via POV, reports intermittent chest pain, worsening over the last week with radiation through to back, reports associated shortness of breath.  Pt reports taking ASA with some relief.  NAD noted at this time.

## 2019-11-23 NOTE — ED Notes (Signed)
Patient transported to CT 

## 2019-11-23 NOTE — ED Notes (Signed)
Pt denies chest pain at this time but c/o of SOB. Pt states cough that is dry that is normal due to cancer dx.

## 2019-11-24 LAB — NOVEL CORONAVIRUS, NAA (HOSP ORDER, SEND-OUT TO REF LAB; TAT 18-24 HRS): SARS-CoV-2, NAA: NOT DETECTED

## 2021-07-29 ENCOUNTER — Emergency Department: Payer: Self-pay

## 2021-07-29 ENCOUNTER — Encounter: Payer: Self-pay | Admitting: Emergency Medicine

## 2021-07-29 ENCOUNTER — Inpatient Hospital Stay
Admission: EM | Admit: 2021-07-29 | Discharge: 2021-08-02 | DRG: 177 | Disposition: A | Discharge status: Home / Self Care | Payer: Self-pay | Attending: Internal Medicine | Admitting: Internal Medicine

## 2021-07-29 ENCOUNTER — Other Ambulatory Visit: Payer: Self-pay

## 2021-07-29 DIAGNOSIS — I444 Left anterior fascicular block: Secondary | ICD-10-CM | POA: Diagnosis present

## 2021-07-29 DIAGNOSIS — Z806 Family history of leukemia: Secondary | ICD-10-CM

## 2021-07-29 DIAGNOSIS — Z7982 Long term (current) use of aspirin: Secondary | ICD-10-CM

## 2021-07-29 DIAGNOSIS — E860 Dehydration: Secondary | ICD-10-CM | POA: Diagnosis present

## 2021-07-29 DIAGNOSIS — Z79899 Other long term (current) drug therapy: Secondary | ICD-10-CM

## 2021-07-29 DIAGNOSIS — Z83438 Family history of other disorder of lipoprotein metabolism and other lipidemia: Secondary | ICD-10-CM

## 2021-07-29 DIAGNOSIS — Z7984 Long term (current) use of oral hypoglycemic drugs: Secondary | ICD-10-CM

## 2021-07-29 DIAGNOSIS — J9601 Acute respiratory failure with hypoxia: Secondary | ICD-10-CM | POA: Diagnosis present

## 2021-07-29 DIAGNOSIS — E1165 Type 2 diabetes mellitus with hyperglycemia: Secondary | ICD-10-CM | POA: Diagnosis present

## 2021-07-29 DIAGNOSIS — J1282 Pneumonia due to coronavirus disease 2019: Secondary | ICD-10-CM | POA: Diagnosis present

## 2021-07-29 DIAGNOSIS — A0839 Other viral enteritis: Secondary | ICD-10-CM | POA: Diagnosis present

## 2021-07-29 DIAGNOSIS — N390 Urinary tract infection, site not specified: Secondary | ICD-10-CM | POA: Diagnosis present

## 2021-07-29 DIAGNOSIS — U071 COVID-19: Principal | ICD-10-CM | POA: Diagnosis present

## 2021-07-29 DIAGNOSIS — Z20822 Contact with and (suspected) exposure to covid-19: Secondary | ICD-10-CM | POA: Diagnosis present

## 2021-07-29 DIAGNOSIS — C9000 Multiple myeloma not having achieved remission: Secondary | ICD-10-CM | POA: Diagnosis present

## 2021-07-29 DIAGNOSIS — Z794 Long term (current) use of insulin: Secondary | ICD-10-CM

## 2021-07-29 DIAGNOSIS — I10 Essential (primary) hypertension: Secondary | ICD-10-CM | POA: Diagnosis present

## 2021-07-29 LAB — COMPREHENSIVE METABOLIC PANEL
ALT: 14 U/L (ref 0–44)
AST: 19 U/L (ref 15–41)
Albumin: 3 g/dL — ABNORMAL LOW (ref 3.5–5.0)
Alkaline Phosphatase: 109 U/L (ref 38–126)
Anion gap: 11 (ref 5–15)
BUN: 18 mg/dL (ref 6–20)
CO2: 26 mmol/L (ref 22–32)
Calcium: 8.4 mg/dL — ABNORMAL LOW (ref 8.9–10.3)
Chloride: 97 mmol/L — ABNORMAL LOW (ref 98–111)
Creatinine, Ser: 0.78 mg/dL (ref 0.44–1.00)
GFR, Estimated: >60 mL/min (ref >60)
Glucose, Bld: 262 mg/dL — ABNORMAL HIGH (ref 70–99)
Potassium: 4 mmol/L (ref 3.5–5.1)
Sodium: 134 mmol/L — ABNORMAL LOW (ref 135–145)
Total Bilirubin: 0.6 mg/dL (ref 0.3–1.2)
Total Protein: 6.5 g/dL (ref 6.5–8.1)

## 2021-07-29 LAB — URINALYSIS, COMPLETE (UACMP) WITH MICROSCOPIC
Bilirubin Urine: NEGATIVE
Glucose, UA: >=500 mg/dL — AB
Ketones, ur: NEGATIVE mg/dL
Nitrite: NEGATIVE
Protein, ur: 100 mg/dL — AB
Specific Gravity, Urine: 1.01 (ref 1.005–1.030)
pH: 6 (ref 5.0–8.0)

## 2021-07-29 LAB — CBC
HCT: 35.2 % — ABNORMAL LOW (ref 36.0–46.0)
HCT: 40.2 % (ref 36.0–46.0)
Hemoglobin: 12.3 g/dL (ref 12.0–15.0)
Hemoglobin: 13.9 g/dL (ref 12.0–15.0)
MCH: 29.1 pg (ref 26.0–34.0)
MCH: 29.4 pg (ref 26.0–34.0)
MCHC: 34.9 g/dL (ref 30.0–36.0)
MCHC: 34.6 g/dL (ref 30.0–36.0)
MCV: 83.4 fL (ref 80.0–100.0)
MCV: 85.2 fL (ref 80.0–100.0)
Platelets: 206 10*3/uL (ref 150–400)
Platelets: 245 10*3/uL (ref 150–400)
RBC: 4.22 MIL/uL (ref 3.87–5.11)
RBC: 4.72 MIL/uL (ref 3.87–5.11)
RDW: 13 % (ref 11.5–15.5)
RDW: 13 % (ref 11.5–15.5)
WBC: 10.9 10*3/uL — ABNORMAL HIGH (ref 4.0–10.5)
WBC: 12.4 10*3/uL — ABNORMAL HIGH (ref 4.0–10.5)
nRBC: 0 % (ref 0.0–0.2)
nRBC: 0 % (ref 0.0–0.2)

## 2021-07-29 LAB — C-REACTIVE PROTEIN: CRP: 21.2 mg/dL — ABNORMAL HIGH (ref <1.0)

## 2021-07-29 LAB — HEMOGLOBIN A1C
Hgb A1c MFr Bld: 12.9 % — ABNORMAL HIGH (ref 4.8–5.6)
Mean Plasma Glucose: 323.53 mg/dL

## 2021-07-29 LAB — D-DIMER, QUANTITATIVE: D-Dimer, Quant: 0.69 ug/mL-FEU — ABNORMAL HIGH (ref 0.00–0.50)

## 2021-07-29 LAB — CBG MONITORING, ED
Glucose-Capillary: 244 mg/dL — ABNORMAL HIGH (ref 70–99)
Glucose-Capillary: 224 mg/dL — ABNORMAL HIGH (ref 70–99)

## 2021-07-29 LAB — LIPASE, BLOOD: Lipase: 24 U/L (ref 11–51)

## 2021-07-29 LAB — TROPONIN I (HIGH SENSITIVITY): Troponin I (High Sensitivity): 4 ng/L (ref <18)

## 2021-07-29 LAB — RESP PANEL BY RT-PCR (FLU A&B, COVID) ARPGX2
Influenza A by PCR: NEGATIVE
Influenza B by PCR: NEGATIVE
SARS Coronavirus 2 by RT PCR: POSITIVE — AB

## 2021-07-29 LAB — LACTATE DEHYDROGENASE: LDH: 196 U/L — ABNORMAL HIGH (ref 98–192)

## 2021-07-29 LAB — HIV ANTIBODY (ROUTINE TESTING W REFLEX): HIV Screen 4th Generation wRfx: NONREACTIVE

## 2021-07-29 LAB — GLUCOSE, CAPILLARY: Glucose-Capillary: 204 mg/dL — ABNORMAL HIGH (ref 70–99)

## 2021-07-29 LAB — VITAMIN D 25 HYDROXY (VIT D DEFICIENCY, FRACTURES): Vit D, 25-Hydroxy: 14.55 ng/mL — ABNORMAL LOW (ref 30–100)

## 2021-07-29 LAB — FERRITIN: Ferritin: 142 ng/mL (ref 11–307)

## 2021-07-29 LAB — CREATININE, SERUM
Creatinine, Ser: 0.77 mg/dL (ref 0.44–1.00)
GFR, Estimated: >60 mL/min (ref >60)

## 2021-07-29 LAB — PROCALCITONIN: Procalcitonin: 0.11 ng/mL

## 2021-07-29 IMAGING — CR DG CHEST 2V
1 series · 2 of 2 positions shown · non-contrast
Comparison: Chest CTA 11/23/2019 and earlier.

CLINICAL DATA: 54-year-old female with dehydration, nausea
vomiting, abdominal pain.

EXAM:
CHEST - 2 VIEW

[Series 1: dg chest 2 view · 0.14mm/px · 2 of 2 slices shown]
[im 1/2]
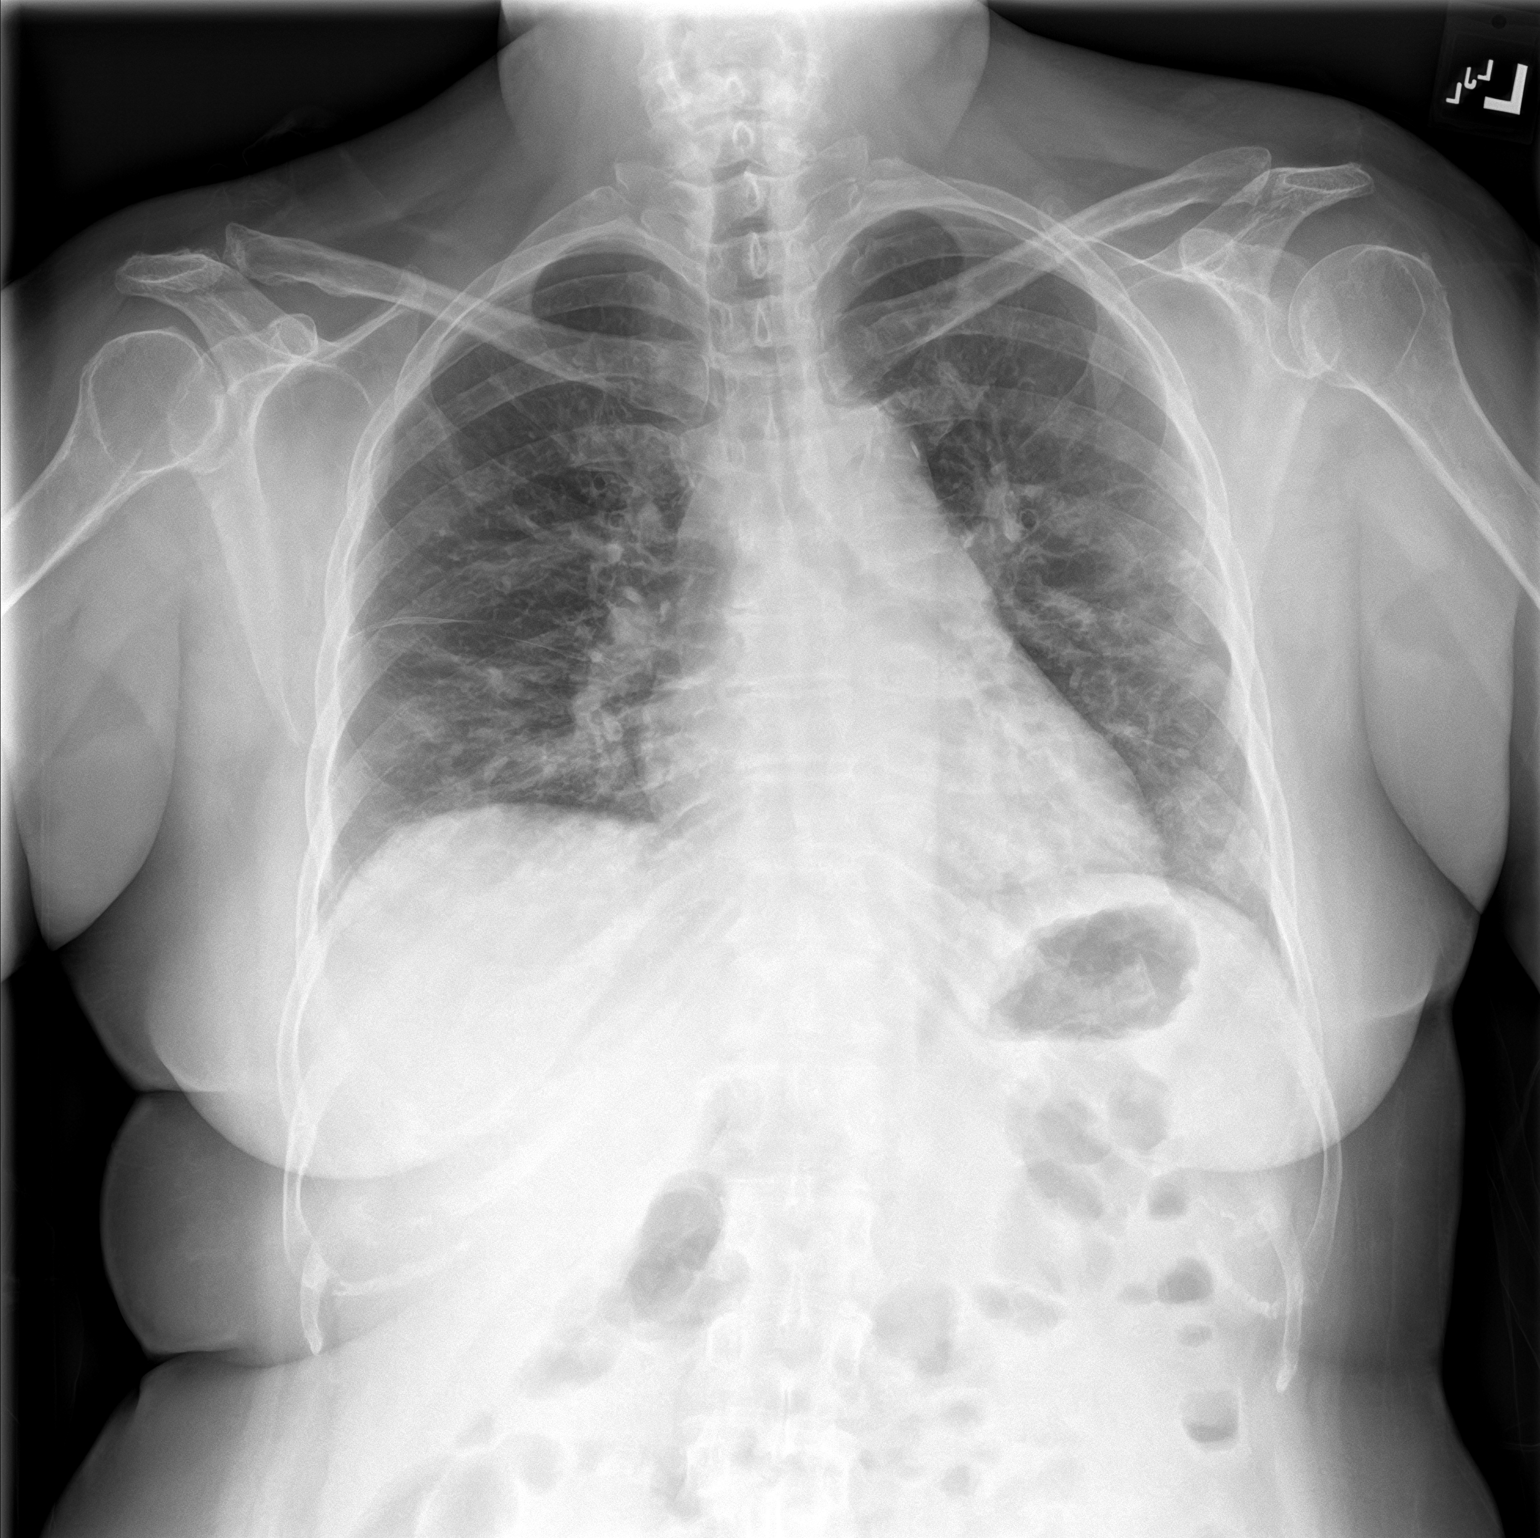
[im 2/2]
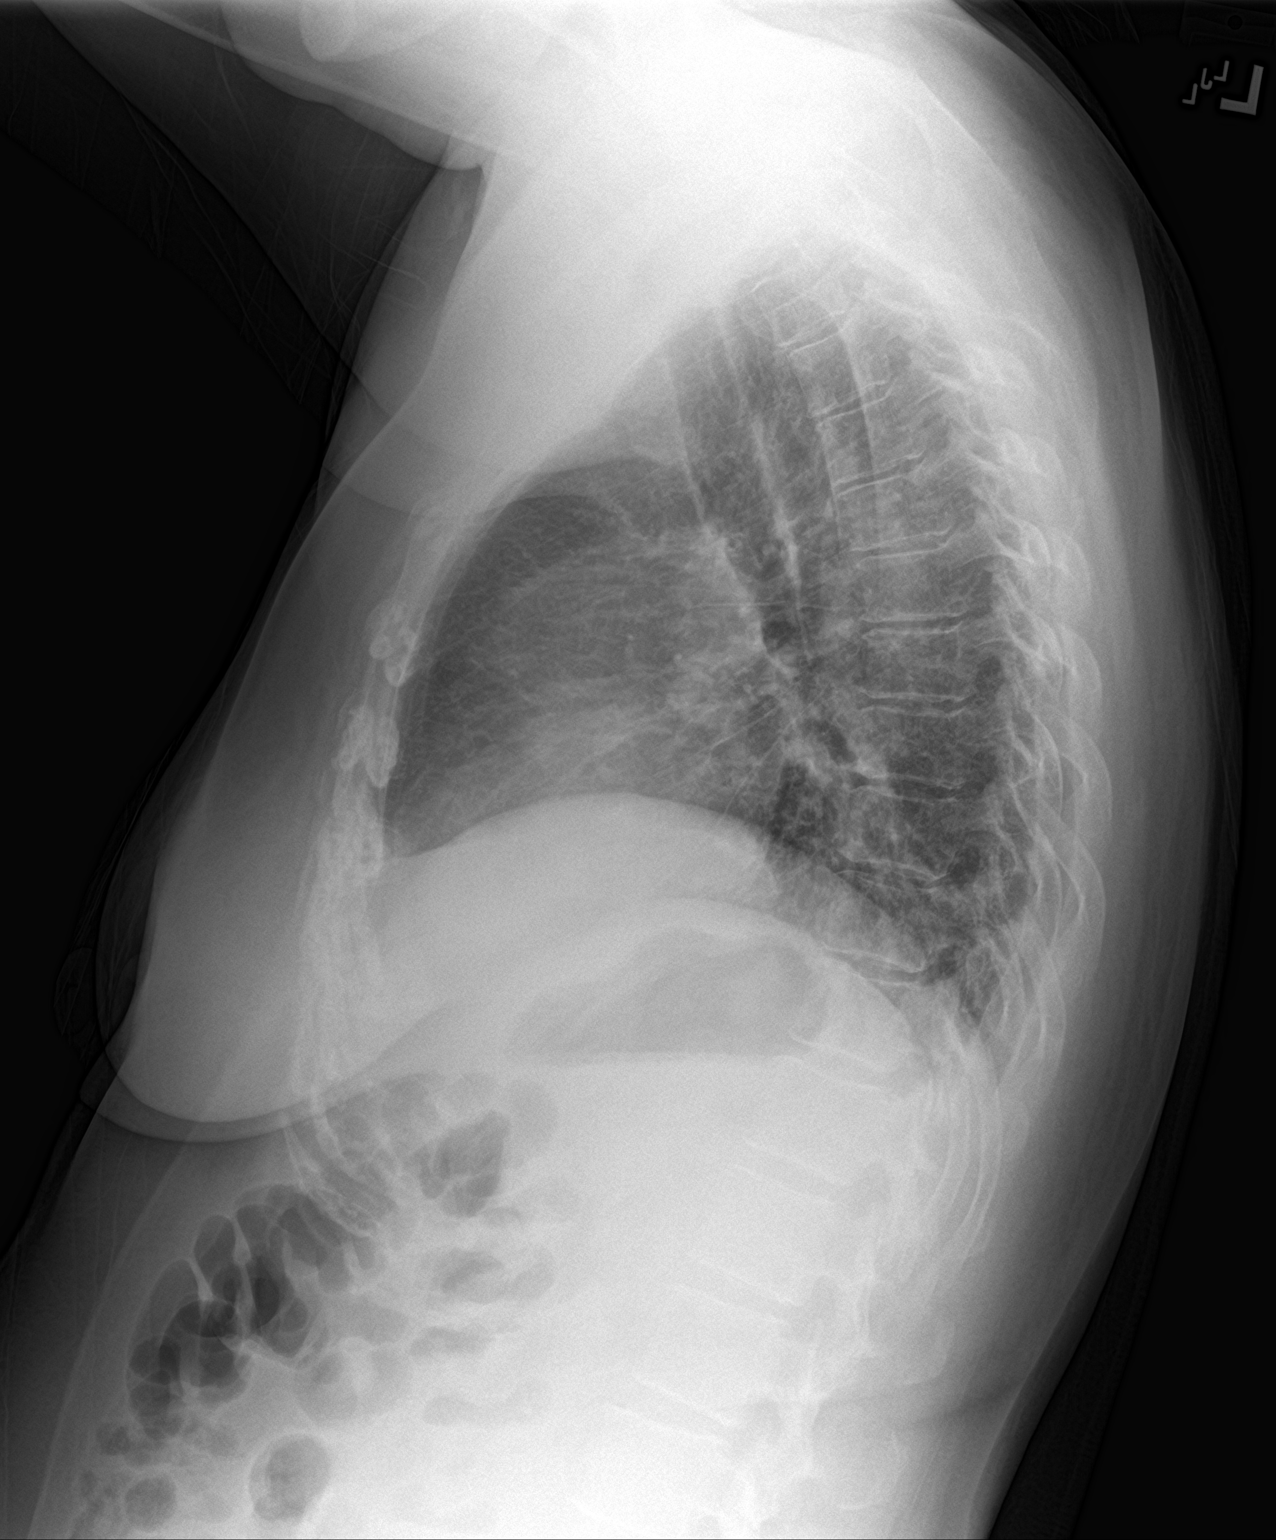

[2 of 2 positions shown; findings below may reference images not displayed]

FINDINGS: PA and lateral views. Lower lung volumes compared to 0404. Patchy,
peripheral and indistinct pulmonary opacities are asymmetric and
more extensive in the left lung. No pneumothorax, pulmonary edema or
pleural effusion.

Mediastinal contours remain normal. Visualized tracheal air column
is within normal limits.

No acute osseous abnormality identified. Negative visible bowel gas
pattern.
IMPRESSION: Aymmetric patchy and indistinct pulmonary opacities, suspicious for
acute viral/atypical respiratory infection. No pleural effusion.

## 2021-07-29 MED ORDER — HYDROCOD POLST-CPM POLST ER 10-8 MG/5ML PO SUER
5.0000 mL | Freq: Two times a day (BID) | ORAL | Status: DC | PRN
  Administered 2021-07-31: 5 mL via ORAL
  Filled 2021-07-29 (×2): qty 5

## 2021-07-29 MED ORDER — ONDANSETRON HCL 4 MG/2ML IJ SOLN
4.0000 mg | Freq: Four times a day (QID) | INTRAMUSCULAR | Status: DC | PRN
  Administered 2021-07-30: 12:00:00 4 mg via INTRAVENOUS
  Filled 2021-07-29: qty 2

## 2021-07-29 MED ORDER — INSULIN ASPART 100 UNIT/ML IJ SOLN: 3.0000 [IU] | Freq: Three times a day (TID) | INTRAMUSCULAR | Status: DC

## 2021-07-29 MED ORDER — SODIUM CHLORIDE 0.9 % IV SOLN: 200.0000 mg | Freq: Once | INTRAVENOUS | Status: DC

## 2021-07-29 MED ORDER — DOCUSATE SODIUM 100 MG PO CAPS
100.0000 mg | ORAL_CAPSULE | Freq: Two times a day (BID) | ORAL | Status: DC
  Administered 2021-07-29 – 2021-08-02 (×6): 100 mg via ORAL
  Filled 2021-07-29 (×8): qty 1

## 2021-07-29 MED ORDER — POLYETHYLENE GLYCOL 3350 17 G PO PACK: 17.0000 g | PACK | Freq: Every day | ORAL | Status: DC | PRN

## 2021-07-29 MED ORDER — ALBUTEROL SULFATE HFA 108 (90 BASE) MCG/ACT IN AERS
2.0000 | INHALATION_SPRAY | Freq: Four times a day (QID) | RESPIRATORY_TRACT | Status: DC | PRN
  Filled 2021-07-29: qty 6.7

## 2021-07-29 MED ORDER — SODIUM CHLORIDE 0.9 % IV SOLN: 100.0000 mg | Freq: Every day | INTRAVENOUS | Status: DC

## 2021-07-29 MED ORDER — FAMOTIDINE IN NACL 20-0.9 MG/50ML-% IV SOLN
20.0000 mg | Freq: Once | INTRAVENOUS | Status: AC
  Administered 2021-07-29: 20 mg via INTRAVENOUS
  Filled 2021-07-29: qty 50

## 2021-07-29 MED ORDER — ASCORBIC ACID 500 MG PO TABS
500.0000 mg | ORAL_TABLET | Freq: Every day | ORAL | Status: DC
  Administered 2021-07-29 – 2021-08-02 (×5): 500 mg via ORAL
  Filled 2021-07-29 (×5): qty 1

## 2021-07-29 MED ORDER — INSULIN ASPART 100 UNIT/ML IJ SOLN
0.0000 [IU] | Freq: Every day | INTRAMUSCULAR | Status: DC
  Administered 2021-07-29 – 2021-07-30 (×2): 2 [IU] via SUBCUTANEOUS
  Administered 2021-07-31 – 2021-08-01 (×2): 3 [IU] via SUBCUTANEOUS
  Filled 2021-07-29 (×4): qty 1

## 2021-07-29 MED ORDER — SODIUM CHLORIDE 0.9% FLUSH
3.0000 mL | Freq: Two times a day (BID) | INTRAVENOUS | Status: DC
  Administered 2021-07-29 – 2021-08-02 (×9): 3 mL via INTRAVENOUS

## 2021-07-29 MED ORDER — DEXAMETHASONE SODIUM PHOSPHATE 10 MG/ML IJ SOLN
6.0000 mg | Freq: Once | INTRAMUSCULAR | Status: AC
  Administered 2021-07-29: 6 mg via INTRAVENOUS
  Filled 2021-07-29: qty 1

## 2021-07-29 MED ORDER — SODIUM CHLORIDE 0.9 % IV SOLN
200.0000 mg | Freq: Once | INTRAVENOUS | Status: AC
  Administered 2021-07-29: 200 mg via INTRAVENOUS
  Filled 2021-07-29: qty 40

## 2021-07-29 MED ORDER — INSULIN ASPART 100 UNIT/ML IJ SOLN
0.0000 [IU] | Freq: Three times a day (TID) | INTRAMUSCULAR | Status: DC
  Administered 2021-07-29 (×2): 5 [IU] via SUBCUTANEOUS
  Filled 2021-07-29 (×2): qty 1

## 2021-07-29 MED ORDER — ONDANSETRON HCL 4 MG PO TABS: 4.0000 mg | ORAL_TABLET | Freq: Four times a day (QID) | ORAL | Status: DC | PRN

## 2021-07-29 MED ORDER — INSULIN DETEMIR 100 UNIT/ML ~~LOC~~ SOLN
20.0000 [IU] | Freq: Two times a day (BID) | SUBCUTANEOUS | Status: DC
  Administered 2021-07-29 (×2): 20 [IU] via SUBCUTANEOUS
  Filled 2021-07-29 (×4): qty 0.2

## 2021-07-29 MED ORDER — ONDANSETRON HCL 4 MG/2ML IJ SOLN
4.0000 mg | Freq: Once | INTRAMUSCULAR | Status: AC
  Administered 2021-07-29: 4 mg via INTRAVENOUS
  Filled 2021-07-29: qty 2

## 2021-07-29 MED ORDER — ZINC SULFATE 220 (50 ZN) MG PO CAPS
220.0000 mg | ORAL_CAPSULE | Freq: Every day | ORAL | Status: DC
  Administered 2021-07-29 – 2021-08-02 (×5): 220 mg via ORAL
  Filled 2021-07-29 (×5): qty 1

## 2021-07-29 MED ORDER — ACETAMINOPHEN 325 MG PO TABS
650.0000 mg | ORAL_TABLET | Freq: Four times a day (QID) | ORAL | Status: DC | PRN
  Administered 2021-07-29 – 2021-07-31 (×3): 650 mg via ORAL
  Filled 2021-07-29 (×4): qty 2

## 2021-07-29 MED ORDER — GUAIFENESIN-DM 100-10 MG/5ML PO SYRP
10.0000 mL | ORAL_SOLUTION | ORAL | Status: DC | PRN
  Administered 2021-07-30 – 2021-07-31 (×2): 10 mL via ORAL
  Filled 2021-07-29 (×2): qty 10

## 2021-07-29 MED ORDER — SODIUM CHLORIDE 0.9 % IV SOLN
100.0000 mg | Freq: Every day | INTRAVENOUS | Status: AC
  Administered 2021-07-30 – 2021-08-02 (×4): 100 mg via INTRAVENOUS
  Filled 2021-07-29 (×4): qty 100

## 2021-07-29 MED ORDER — LACTATED RINGERS IV BOLUS
1000.0000 mL | Freq: Once | INTRAVENOUS | Status: AC
  Administered 2021-07-29: 1000 mL via INTRAVENOUS

## 2021-07-29 MED ORDER — DEXAMETHASONE SODIUM PHOSPHATE 10 MG/ML IJ SOLN: 6.0000 mg | INTRAMUSCULAR | Status: DC

## 2021-07-29 MED ORDER — ENOXAPARIN SODIUM 40 MG/0.4ML IJ SOSY
40.0000 mg | PREFILLED_SYRINGE | INTRAMUSCULAR | Status: DC
  Administered 2021-07-29 – 2021-08-01 (×4): 40 mg via SUBCUTANEOUS
  Filled 2021-07-29 (×4): qty 0.4

## 2021-07-29 NOTE — ED Notes (Signed)
Patient is resting comfortably in room talking with family on the phone. VSS. NAD. Call light within reach. Will continue to monitor for changes.

## 2021-07-29 NOTE — H&P (Signed)
History and Physical    TAVONNA RULLI [id number] DOB: 02/12/1966 DOA: 07/29/2021  PCP: Pcp, No  Patient coming from: home  I have personally briefly reviewed patient's old medical records in [place]  Chief Complaint:cough, sob, n/v/ x 1 week   HPI: Ruth Walters is a 55 y.o. 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  Per patient she has sick contact in her son who only had a cough. She states he took an at  home COVID test and was negative . She currently states that she feels improved at this time.But notes still has cough with pleurisy.  ED Course: Afeb, bp 127/61, hr 80, rr 17 Wbc: 10.9, hbg12.3, plt206  Lipase 24 NA:134, k 4, cl97, glu 262,  CE 4 UA:LE+ wbc 6-10+ bacteria  COVID pcr + Tx zofran , ppi,LR 1L [id number]: Aymmetric patchy and indistinct pulmonary opacities, suspicious for acute viral/atypical respiratory infection. No pleural effusion. Review of Systems: As per HPI otherwise 10 point review of systems negative.   Past Medical History:  Diagnosis Date   Cancer ([place])    Multiple Myleoma   Diabetes mellitus without complication ([place])    Hypertension    Low blood pressure     Past Surgical History:  Procedure Laterality Date   ABDOMINAL HYSTERECTOMY       reports that she has never smoked. She has never used smokeless tobacco. She reports that she does not drink alcohol and does not use drugs.  No Known Allergies  Family History  Problem Relation Age of Onset   Leukemia Sister    Hyperlipidemia Mother    Prior to Admission medications   Medication Sig Start Date End Date Taking? Authorizing Provider   amoxicillin-clavulanate (AUGMENTIN) 500-125 MG tablet Take 1 tablet (500 mg total) by mouth 2 (two) times daily. 07/05/18   Delman Kitten, MD  aspirin EC 81 MG tablet Take 81 mg by mouth daily. 11/13/14   [provider]  famotidine (PEPCID) 20 MG tablet Take 1 tablet (20 mg total) by mouth 2 (two) times daily. 11/23/19   Carrie Mew, MD  glipiZIDE (GLUCOTROL) 10 MG tablet Take 1 tablet (10 mg total) by mouth 2 (two) times daily before a meal. Patient taking differently: Take 10 mg by mouth daily before breakfast.  06/15/17   Loletha Grayer, MD  insulin lispro (HUMALOG KWIKPEN) 100 UNIT/ML KiwkPen Inject 5 Units into the skin daily before supper. Patient has not started taking this but has Humalog insulin pen at home but patient and family did not know how to use it 04/06/18 07/05/18  [provider]  insulin NPH Human (HUMULIN N,NOVOLIN N) 100 UNIT/ML injection Inject 20 Units into the skin 2 (two) times daily before a meal.    [provider]  loratadine (CLARITIN) 10 MG tablet Take 10 mg by mouth daily.    [provider]  metFORMIN (GLUMETZA) 1000 MG (MOD) 24 hr tablet Take 1 tablet (1,000 mg total) by mouth daily with breakfast. Patient taking differently: Take 1,000 mg by mouth 2 (two) times daily with a meal.  06/15/17 02/25/19  Loletha Grayer, MD  Multiple Vitamin (MULTIVITAMIN WITH MINERALS) TABS tablet Take 1 tablet by mouth daily. 06/15/17   Loletha Grayer, MD  polyethylene glycol (MIRALAX / GLYCOLAX) packet Take 17 g by mouth daily as needed.    [provider]  senna (SENOKOT) 8.6 MG TABS tablet Take 2 tablets by mouth at bedtime.    [provider]    Physical Exam: Vitals:   07/29/21 0841 07/29/21 0842  BP:  127/61  Pulse:  80  Resp:  17  Temp:  98.4 F (36.9 C)  TempSrc:  Oral  SpO2:  94%  Weight: 63.5 kg   Height: '4\' 11"'$  (1.499 m)     Constitutional: NAD, calm, comfortable Vitals:   07/29/21 0841 07/29/21 0842   BP:  127/61  Pulse:  80  Resp:  17  Temp:  98.4 F (36.9 C)  TempSrc:  Oral  SpO2:  94%  Weight: 63.5 kg   Height: '4\' 11"'$  (1.499 m)    Eyes: PERRL, lids and conjunctivae normal ENMT: Mucous membranes are moist. Posterior pharynx clear of any exudate or lesions.Normal dentition.  Neck: normal, supple, no masses, no thyromegaly Respiratory: clear to auscultation bilaterally, no wheezing, no crackles. Normal respiratory effort. No accessory muscle use.  Cardiovascular: Regular rate and rhythm, no murmurs / rubs / gallops. No extremity edema. 2+ pedal pulses. No carotid bruits.  Abdomen: no tenderness, no masses palpated. No hepatosplenomegaly. Bowel sounds positive.  Musculoskeletal: no clubbing / cyanosis. No joint deformity upper and lower extremities. Good ROM, no contractures. Normal muscle tone.  Skin: no rashes, lesions, ulcers. No induration Neurologic: CN 2-12 grossly intact. Sensation intact, DTR normal. Strength 5/5 in all 4.  Psychiatric: Normal judgment and insight. Alert and oriented x 3. Normal mood.    Labs on Admission: I have personally reviewed following labs and imaging studies  CBC: Recent Labs  Lab 07/29/21 0846  WBC 10.9*  HGB 12.3  HCT 35.2*  MCV 83.4  PLT [id number]   Basic Metabolic Panel: Recent Labs  Lab 07/29/21 0846  NA 134*  K 4.0  CL 97*  CO2 26  GLUCOSE 262*  BUN 18  CREATININE 0.78  CALCIUM 8.4*   GFR: Estimated Creatinine Clearance: 65.1 mL/min (by C-G formula based on SCr of 0.78 mg/dL). Liver Function Tests: Recent Labs  Lab 07/29/21 0846  AST 19  ALT 14  ALKPHOS 109  BILITOT 0.6  PROT 6.5  ALBUMIN 3.0*   Recent Labs  Lab 07/29/21 0846  LIPASE 24   No results for input(s): AMMONIA in the last 168 hours. Coagulation Profile: No results for input(s): INR, PROTIME in the last 168 hours. Cardiac Enzymes: No results for input(s): CKTOTAL, CKMB, CKMBINDEX, TROPONINI in the last 168 hours. BNP (last 3 results) No results for  input(s): PROBNP in the last 8760 hours. HbA1C: No results for input(s): HGBA1C in the last 72 hours. CBG: No results for input(s): GLUCAP in the last 168 hours. Lipid Profile: No results for input(s): CHOL, HDL, LDLCALC, TRIG, CHOLHDL, LDLDIRECT in the last 72 hours. Thyroid Function Tests: No results for input(s): TSH, T4TOTAL, FREET4, T3FREE, THYROIDAB in the last 72 hours. Anemia Panel: No results for input(s): VITAMINB12, FOLATE, FERRITIN, TIBC, IRON, RETICCTPCT in the last 72 hours. Urine analysis:    Component Value Date/Time   COLORURINE YELLOW (A) 07/29/2021 0846   APPEARANCEUR HAZY (A) 07/29/2021 0846   APPEARANCEUR Hazy 07/31/2013 1006   LABSPEC 1.010 07/29/2021 0846   LABSPEC 1.024 07/31/2013 1006   PHURINE 6.0 07/29/2021 0846   GLUCOSEU >=500 (A) 07/29/2021 0846   GLUCOSEU >=500 07/31/2013 1006   HGBUR SMALL (A) 07/29/2021  [place] 07/29/2021 0846   BILIRUBINUR Negative 07/31/2013 1006   [place] 07/29/2021 0846   PROTEINUR 100 (A) 07/29/2021 0846   NITRITE NEGATIVE 07/29/2021 0846   LEUKOCYTESUR MODERATE (A) 07/29/2021 0846   LEUKOCYTESUR Negative 07/31/2013 1006    Radiological Exams on Admission: DG Chest 2 View  Result Date: 07/29/2021 CLINICAL DATA:  55 year old female with dehydration, nausea vomiting, abdominal pain. EXAM: CHEST - 2 VIEW COMPARISON:  Chest CTA 11/23/2019 and earlier. FINDINGS: PA and lateral views. Lower lung volumes compared to 2020. Patchy, peripheral and indistinct pulmonary opacities are asymmetric and more extensive in the left lung. No pneumothorax, pulmonary edema or pleural effusion. Mediastinal contours remain normal. Visualized tracheal air column is within normal limits. No acute osseous abnormality identified. Negative visible bowel gas pattern. IMPRESSION: Aymmetric patchy and indistinct pulmonary opacities, suspicious for acute viral/atypical respiratory infection. No pleural effusion. Electronically  Signed   By: Genevie Ann M.D.   On: 07/29/2021 09:54    EKG: Independently reviewed.  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

## 2021-07-29 NOTE — Consult Note (Signed)
Remdesivir - Pharmacy Brief Note   O:  ALT: 14 CXR: "Aymmetric patchy and indistinct pulmonary opacities, suspicious for acute viral/atypical respiratory infection" SpO2: On room air. Per chart review, desaturates with ambulation to 91-93%   A/P:  07/29/21 SARS-CoV-2 PCR (+)  Remdesivir 200 mg IVPB once followed by 100 mg IVPB daily x 4 days.   Benita Gutter  07/29/2021 11:42 AM

## 2021-07-29 NOTE — ED Notes (Signed)
Pt ambulated in room and was 91 to 93% on room air.

## 2021-07-29 NOTE — ED Notes (Signed)
Pts Daughter Helene Kelp [phone]

## 2021-07-29 NOTE — ED Triage Notes (Signed)
Pt comes into the ED via POV c/o possible dehydration from N/V.  Pt states she had some abd pain last night and she also has a headache.  Pt explains that the symptoms have been ongoing for almost a week. PT currently has even and unlabored respirations and is in NAD.

## 2021-07-29 NOTE — ED Provider Notes (Signed)
[facility]  ____________________________________________   Event Date/Time   First MD Initiated Contact with Patient 07/29/21 [phone]     (approximate)  I have reviewed the triage vital signs and the nursing notes.   HISTORY  Chief Complaint Dehydration    HPI Ruth Walters is a 55 y.o. female multiple myeloma, diabetes, hypertension who presents with nausea, cough and generalized malaise.  Symptom onset was 1 week ago.  She endorses cough that is productive as well as shortness of breath.  Has also had difficulty eating and feels like food gets stuck but is able to tolerate p.o.  Has not actually vomited.  She has also had several episodes of diarrhea.  Occasional abdominal pain but not at the moment.  Has had chills and subjective fever at home.  She denies chest pain.  No lower extremity swelling.  Tells me that she does not usually have nausea and vomiting after chemotherapy.  She is currently getting chemotherapy once per month at [facility], does not remember when she last had it.  She has been vaccinated for COVID.         Past Medical History:  Diagnosis Date   Cancer [facility])    Multiple Myleoma   Diabetes mellitus without complication ([place])    Hypertension    Low blood pressure     Patient Active Problem List   Diagnosis Date Noted   Pneumonia due to COVID-19 virus 07/29/2021   Hypotension 04/27/2018   Intractable nausea and vomiting 06/12/2017   Chest pain 06/12/2017   Sepsis ([place]) 02/14/2016   HCAP (healthcare-associated pneumonia) 02/14/2016   Multiple myeloma ([place]) 02/14/2016   Type 2 diabetes mellitus ([place]) 02/14/2016    Past Surgical History:  Procedure Laterality Date   ABDOMINAL HYSTERECTOMY      Prior to Admission medications   Medication Sig Start Date End Date Taking? Authorizing Provider  aspirin EC 81 MG tablet Take 81 mg by mouth daily. Swallow whole.   Yes [provider]  brimonidine (ALPHAGAN) 0.2 % ophthalmic  solution Place 1 drop into the right eye 3 (three) times daily.   Yes [provider]  insulin aspart (NOVOLOG) 100 UNIT/ML injection Inject 5 Units into the skin 3 (three) times daily before meals.   Yes [provider]  insulin glargine (LANTUS) 100 UNIT/ML injection Inject 48 Units into the skin at bedtime.   Yes [provider]  lisinopril (ZESTRIL) 5 MG tablet Take 2.5 mg by mouth daily.   Yes [provider]  gabapentin (NEURONTIN) 300 MG capsule Take 300 mg by mouth 2 (two) times daily. Patient not taking: Reported on 07/29/2021    [provider]  glipiZIDE (GLUCOTROL) 10 MG tablet Take 10 mg by mouth daily before breakfast. Patient not taking: Reported on 07/29/2021    [provider]  metFORMIN (GLUCOPHAGE-XR) 500 MG 24 hr tablet Take 1,000 mg by mouth 2 (two) times daily. Patient not taking: Reported on 07/29/2021    [provider]  pantoprazole (PROTONIX) 20 MG tablet Take 20 mg by mouth daily. Patient not taking: Reported on 07/29/2021    [provider]  valACYclovir (VALTREX) 500 MG tablet Take 500 mg by mouth daily. Patient not taking: Reported on 07/29/2021    [provider]    Allergies Patient has no known allergies.  Family History  Problem Relation Age of Onset   Leukemia Sister    Hyperlipidemia Mother     Social History Social History  Tobacco Use   Smoking status: Never   Smokeless tobacco: Never  Vaping Use   Vaping Use: Never used  Substance Use Topics   Alcohol use: No    Alcohol/week: 0.0 standard drinks   Drug use: No    Review of Systems   Review of Systems  Constitutional:  Positive for appetite change, chills, fatigue and fever.  HENT:  Positive for congestion.   Respiratory:  Positive for cough and shortness of breath.   Cardiovascular:  Negative for chest pain.  Gastrointestinal:  Positive for abdominal pain, diarrhea and nausea. Negative for vomiting.   Genitourinary:  Negative for dysuria.  Musculoskeletal:  Positive for myalgias.  Neurological:  Negative for headaches.  All other systems reviewed and are negative.  Physical Exam Updated Vital Signs BP 119/65 (BP Location: Right Arm)   Pulse (!) 107   Temp 98.4 F (36.9 C) (Oral)   Resp 16   Ht _0  (1.499 m)   Wt 63.5 kg   SpO2 96%   BMI 28.27 kg/m   Physical Exam Vitals and nursing note reviewed.  Constitutional:      General: She is not in acute distress.    Appearance: Normal appearance. She is not toxic-appearing.     Comments: Appears fatigued but nontoxic  HENT:     Head: Normocephalic and atraumatic.     Nose: Nose normal. No congestion.     Mouth/Throat:     Mouth: Mucous membranes are dry.  Eyes:     General: No scleral icterus.    Conjunctiva/sclera: Conjunctivae normal.  Cardiovascular:     Rate and Rhythm: Normal rate and regular rhythm.  Pulmonary:     Effort: Pulmonary effort is normal. No respiratory distress.     Breath sounds: Normal breath sounds. No wheezing.  Abdominal:     Palpations: Abdomen is soft.     Tenderness: There is no abdominal tenderness. There is no guarding.  Musculoskeletal:        General: No swelling, tenderness, deformity or signs of injury. Normal range of motion.     Cervical back: Neck supple. No rigidity.  Skin:    General: Skin is warm and dry.     Coloration: Skin is not jaundiced.  Neurological:     General: No focal deficit present.     Mental Status: She is alert and oriented to person, place, and time.  Psychiatric:        Mood and Affect: Mood normal.        Behavior: Behavior normal.     LABS (all labs ordered are listed, but only abnormal results are displayed)  Labs Reviewed  RESP PANEL BY RT-PCR (FLU A&B, COVID) ARPGX2 - Abnormal; Notable for the following components:      Result Value   SARS Coronavirus 2 by RT PCR POSITIVE (*)    All other components within normal limits  COMPREHENSIVE  METABOLIC PANEL - Abnormal; Notable for the following components:   Sodium 134 (*)    Chloride 97 (*)    Glucose, Bld 262 (*)    Calcium 8.4 (*)    Albumin 3.0 (*)    All other components within normal limits  CBC - Abnormal; Notable for the following components:   WBC 10.9 (*)    HCT 35.2 (*)    All other components within normal limits  URINALYSIS, COMPLETE (UACMP) WITH MICROSCOPIC - Abnormal; Notable for the following components:   Color, Urine YELLOW (*)    APPearance  HAZY (*)    Glucose, UA >=500 (*)    Hgb urine dipstick SMALL (*)    Protein, ur 100 (*)    Leukocytes,Ua MODERATE (*)    Bacteria, UA FEW (*)    All other components within normal limits  CBC - Abnormal; Notable for the following components:   WBC 12.4 (*)    All other components within normal limits  CBG MONITORING, ED - Abnormal; Notable for the following components:   Glucose-Capillary 244 (*)    All other components within normal limits  CULTURE, BLOOD (ROUTINE X 2)  CULTURE, BLOOD (ROUTINE X 2)  LIPASE, BLOOD  HIV ANTIBODY (ROUTINE TESTING W REFLEX)  CREATININE, SERUM  C-REACTIVE PROTEIN  D-DIMER, QUANTITATIVE  FERRITIN  PROCALCITONIN  LACTATE DEHYDROGENASE  VITAMIN D 25 HYDROXY (VIT D DEFICIENCY, FRACTURES)  HEMOGLOBIN A1C  TROPONIN I (HIGH SENSITIVITY)   ____________________________________________  EKG  Left axis deviation, left anterior fascicular block, normal sinus rhythm, poor R wave progression, no acute ischemic changes ____________________________________________  RADIOLOGY Almeta Monas, personally viewed and evaluated these images (plain radiographs) as part of my medical decision making, as well as reviewing the written report by the radiologist.  ED MD interpretation: I reviewed the chest x-ray which shows multifocal infiltrates    ____________________________________________   PROCEDURES  Procedure(s) performed (including Critical  Care):  Procedures   ____________________________________________   INITIAL IMPRESSION / ASSESSMENT AND PLAN / ED COURSE     This patient is a 55 year old female with a history of multiple myeloma currently on chemotherapy who presents with generalized fatigue cough and nausea.  She has borderline hypoxia with a sat of 94% on room air.  She appears fatigued and volume down but is not toxic appearing.  Abdomen is benign.  Her labs are fairly reassuring without electrolyte derangements or change in renal function.  Her chest x-ray does show signs of multifocal infiltrates which could be from viral pneumonia.  I suspect that she has COVID pneumonia.  We will wait for her COVID test to come back.    Covid is positive. Her ambulatory sat is 91%. D/w hospitalist who agrees with admission. Will give dex and remdesivir.   Clinical Course as of 07/29/21 1537  Wed Jul 29, 2021  1059 SARS Coronavirus 2 by RT PCR(!): POSITIVE [KM]    Clinical Course User Index [KM] Rada Hay, MD     ____________________________________________   FINAL CLINICAL IMPRESSION(S) / ED DIAGNOSES  Final diagnoses:  Pneumonia due to COVID-19 virus     ED Discharge Orders     None        Note:  This document was prepared using Dragon voice recognition software and may include unintentional dictation errors.    Rada Hay, MD 07/29/21 1540

## 2021-07-30 DIAGNOSIS — E1165 Type 2 diabetes mellitus with hyperglycemia: Secondary | ICD-10-CM

## 2021-07-30 DIAGNOSIS — I10 Essential (primary) hypertension: Secondary | ICD-10-CM

## 2021-07-30 LAB — COMPREHENSIVE METABOLIC PANEL
ALT: 14 U/L (ref 0–44)
AST: 18 U/L (ref 15–41)
Albumin: 2.8 g/dL — ABNORMAL LOW (ref 3.5–5.0)
Alkaline Phosphatase: 112 U/L (ref 38–126)
Anion gap: 8 (ref 5–15)
BUN: 21 mg/dL — ABNORMAL HIGH (ref 6–20)
CO2: 27 mmol/L (ref 22–32)
Calcium: 8.3 mg/dL — ABNORMAL LOW (ref 8.9–10.3)
Chloride: 102 mmol/L (ref 98–111)
Creatinine, Ser: 1.01 mg/dL — ABNORMAL HIGH (ref 0.44–1.00)
GFR, Estimated: >60 mL/min (ref >60)
Glucose, Bld: 95 mg/dL (ref 70–99)
Potassium: 3.5 mmol/L (ref 3.5–5.1)
Sodium: 137 mmol/L (ref 135–145)
Total Bilirubin: 0.5 mg/dL (ref 0.3–1.2)
Total Protein: 6.1 g/dL — ABNORMAL LOW (ref 6.5–8.1)

## 2021-07-30 LAB — CBC WITH DIFFERENTIAL/PLATELET
Abs Immature Granulocytes: 0.03 10*3/uL (ref 0.00–0.07)
Basophils Absolute: 0 10*3/uL (ref 0.0–0.1)
Basophils Relative: 0 %
Eosinophils Absolute: 0 10*3/uL (ref 0.0–0.5)
Eosinophils Relative: 0 %
HCT: 32.9 % — ABNORMAL LOW (ref 36.0–46.0)
Hemoglobin: 11.4 g/dL — ABNORMAL LOW (ref 12.0–15.0)
Immature Granulocytes: 0 %
Lymphocytes Relative: 26 %
Lymphs Abs: 2.2 10*3/uL (ref 0.7–4.0)
MCH: 28.8 pg (ref 26.0–34.0)
MCHC: 34.7 g/dL (ref 30.0–36.0)
MCV: 83.1 fL (ref 80.0–100.0)
Monocytes Absolute: 0.7 10*3/uL (ref 0.1–1.0)
Monocytes Relative: 9 %
Neutro Abs: 5.3 10*3/uL (ref 1.7–7.7)
Neutrophils Relative %: 65 %
Platelets: 239 10*3/uL (ref 150–400)
RBC: 3.96 MIL/uL (ref 3.87–5.11)
RDW: 12.9 % (ref 11.5–15.5)
WBC: 8.2 10*3/uL (ref 4.0–10.5)
nRBC: 0 % (ref 0.0–0.2)

## 2021-07-30 LAB — D-DIMER, QUANTITATIVE: D-Dimer, Quant: 0.56 ug/mL-FEU — ABNORMAL HIGH (ref 0.00–0.50)

## 2021-07-30 LAB — FERRITIN: Ferritin: 125 ng/mL (ref 11–307)

## 2021-07-30 LAB — GLUCOSE, CAPILLARY
Glucose-Capillary: 95 mg/dL (ref 70–99)
Glucose-Capillary: 150 mg/dL — ABNORMAL HIGH (ref 70–99)
Glucose-Capillary: 126 mg/dL — ABNORMAL HIGH (ref 70–99)
Glucose-Capillary: 224 mg/dL — ABNORMAL HIGH (ref 70–99)

## 2021-07-30 LAB — C-REACTIVE PROTEIN: CRP: 20.1 mg/dL — ABNORMAL HIGH (ref <1.0)

## 2021-07-30 MED ORDER — PANTOPRAZOLE SODIUM 40 MG PO TBEC
40.0000 mg | DELAYED_RELEASE_TABLET | Freq: Every day | ORAL | Status: DC
  Administered 2021-07-30 – 2021-08-02 (×4): 40 mg via ORAL
  Filled 2021-07-30 (×4): qty 1

## 2021-07-30 MED ORDER — INSULIN DETEMIR 100 UNIT/ML ~~LOC~~ SOLN
25.0000 [IU] | Freq: Two times a day (BID) | SUBCUTANEOUS | Status: DC
  Administered 2021-07-30 – 2021-08-02 (×7): 25 [IU] via SUBCUTANEOUS
  Filled 2021-07-30 (×9): qty 0.25

## 2021-07-30 MED ORDER — SODIUM CHLORIDE 0.9 % IV SOLN
1.0000 g | INTRAVENOUS | Status: AC
  Administered 2021-07-30 – 2021-08-01 (×3): 1 g via INTRAVENOUS
  Filled 2021-07-30 (×3): qty 1

## 2021-07-30 MED ORDER — INSULIN ASPART 100 UNIT/ML IJ SOLN
0.0000 [IU] | Freq: Three times a day (TID) | INTRAMUSCULAR | Status: DC
  Administered 2021-07-30 (×2): 3 [IU] via SUBCUTANEOUS
  Administered 2021-07-31: 17:00:00 7 [IU] via SUBCUTANEOUS
  Administered 2021-07-31: 4 [IU] via SUBCUTANEOUS
  Administered 2021-07-31 – 2021-08-01 (×2): 7 [IU] via SUBCUTANEOUS
  Administered 2021-08-01: 4 [IU] via SUBCUTANEOUS
  Administered 2021-08-01 – 2021-08-02 (×2): 7 [IU] via SUBCUTANEOUS
  Filled 2021-07-30 (×9): qty 1

## 2021-07-30 MED ORDER — METHYLPREDNISOLONE SODIUM SUCC 40 MG IJ SOLR
35.0000 mg | Freq: Two times a day (BID) | INTRAMUSCULAR | Status: DC
Start: 2021-07-30 — End: 2021-08-02
  Administered 2021-07-30 – 2021-08-02 (×7): 35 mg via INTRAVENOUS
  Filled 2021-07-30 (×7): qty 1

## 2021-07-30 MED ORDER — INSULIN ASPART 100 UNIT/ML IJ SOLN
6.0000 [IU] | Freq: Three times a day (TID) | INTRAMUSCULAR | Status: DC
  Administered 2021-07-30 – 2021-08-02 (×9): 6 [IU] via SUBCUTANEOUS
  Filled 2021-07-30 (×9): qty 1

## 2021-07-30 NOTE — Progress Notes (Addendum)
Inpatient Diabetes Program Recommendations  AACE/ADA: New Consensus Statement on Inpatient Glycemic Control (2015)  Target Ranges:  Prepandial:   less than 140 mg/dL      Peak postprandial:   less than 180 mg/dL (1-2 hours)      Critically ill patients:  140 - 180 mg/dL   Results for Ruth Walters, Ruth Walters (MRN [id number]) as of 07/30/2021 10:15  Ref. Range 07/29/2021 13:27 07/29/2021 16:36 07/29/2021 23:21 07/30/2021 08:16  Glucose-Capillary Latest Ref Range: 70 - 99 mg/dL 244 (H)  5 units NOVOLOG _0   20 units Levemir _1  224 (H)  5 units NOVOLOG  204 (H)  2 units NOVOLOG   20 units Levemir _2  95  Results for Ruth Walters, Ruth Walters (MRN [id number]) as of 07/30/2021 10:15  Ref. Range 07/29/2021 14:51  Hemoglobin A1C Latest Ref Range: 4.8 - 5.6 % 12.9 (H)  (323 mg/dl)    Admit with: COVID viral Pneumonia with associated hypoxic respiratory failure   History: DM, Multiple Myeloma  Home DM Meds: Novolog 5 units TID       Lantus 48 units QHS  Current Orders: Novolog Resistant Correction Scale/ SSI (0-20 units) TID AC + HS      Novolog 6 units TID with meals     Levemir 25 units BID    Getting Solumedrol 35 mg BID  Note Levemir started yesterday AM and dose increased this AM   MD- Note patient received total of 40 units Levemir yesterday (08/10).  CBG 95 this AM.  May consider reducing Levemir back to 20 units BID    Addendum 12:40pm--Met w/ pt at bedside using [employer] interpreter Hetty Blend).  Verified home insulin with pt and she was not able to tell me the names of the insulins but told me there are 2 and they are pens--Pt also told me she takes 5 units of the one insulin with meals and 48 units of the other insulin at bedtime.  Clarified Lantus and Novolog with pt and she stated "those are the names".  Gets her insulins thru the hospital pharmacy/clinic with [facility].  Told me she does not miss doses of insulin, however, upon further investigation told me she doesn't have a CBG  meter at home and has been out of insulin for over 1 week at home.  Discussed with pt the importance of taking her insulin regularly and also emphasized to pt the dangers of taking insulin without knowing her CBGs.  Discussed with pt that I will ask the [facility] team if they can give her a free Meter prior to d/c (maybe she could also seek a CBG meter at the [facility] clinic or pt was instructed she can buy an inexpensive CBG meter at [facility].  Reviewed current A1c of 12.9% with pt (explained what it is and what it measures) and the dangers of chronically uncontrolled CBGs.  Also reviewed goal/healthy CBG levels and A1c levels.  Pt did not have any questions for me at this time.     --Will follow patient during hospitalization--  Wyn Quaker RN, MSN, CDE Diabetes Coordinator Inpatient Glycemic Control Team Team Pager: [phone] (8a-5p)

## 2021-07-30 NOTE — Progress Notes (Signed)
PROGRESS NOTE                                                                                                                                                                                                             Patient Demographics:    Ruth Walters, is a 55 y.o. female, DOB - 20-Jun-1966, GNF:[id number]  Outpatient Primary MD for the patient is Pcp, No   Admit date - 07/29/2021   LOS - 1  Chief Complaint  Patient presents with   Dehydration       Brief Narrative: Patient is a 55 y.o. female with PMHx of multiple myeloma-getting chemotherapy at [facility], insulin-dependent DM-2, HTN-who presented with cough, nausea, vomiting, diarrhea for the past several days.  She was found to have COVID 19 pneumonia.  See below for further details.  COVID-19 vaccinated status: Vaccinated x1 booster.  Significant Events: 8/10>> Admit to [facility] for COVID-19 related pneumonia.  Significant studies: 8/10>>Chest x-ray: Bilateral patchy infiltrates (personally reviewed)  COVID-19 medications: Steroids: 8/10>> Remdesivir: 8/10>>  Antibiotics: Rocephin: 8/10>>  Microbiology data: 8/10 >>blood culture: No growth  Procedures: None  Consults: None  DVT prophylaxis: enoxaparin (LOVENOX) injection 40 mg Start: 07/29/21 2200     Subjective:    Hessie Diener today feels better today-no vomiting or diarrhea.  On room air.  Note-interview conducted with iPad translator at bedside.   Assessment  & Plan :   Acute Hypoxic Resp Failure due to Covid 19 Viral pneumonia: Clinically improved-continue steroid/Remdesivir.  Given immunocompromise status-severe Lee elevated CRP-we will escalate dosing of steroids to Solu-Medrol.  Continue to watch closely as she is at risk for severe disease.  Fever: afebrile O2 requirements:  SpO2: 92 %   COVID-19 Labs: Recent Labs    07/29/21 1451 07/30/21 0628  DDIMER 0.69* 0.56*   FERRITIN 142 125  LDH 196*  --   CRP 21.2*  --     No results found for: BNP  Recent Labs  Lab 07/29/21 1451  PROCALCITON 0.11    Lab Results  Component Value Date   SARSCOV2NAA POSITIVE (A) 07/29/2021   SARSCOV2NAA NOT DETECTED 11/23/2019      Prone/Incentive Spirometry: encouraged incentive spirometry use 3-4/hour.  Asymptomatic bacteriuria: No UTI symptoms-immunocompromised-urine culture never done-will likely be sterile as she is already on Rocephin.  We will plan on empiric treatment with  Rocephin x3 days.  HTN: BP stable-resume lisinopril when able.  Insulin-dependent DM-2 with uncontrolled hyperglycemia (A1c 12.9): At risk for further worsening of hyperglycemia due to steroids-change Levemir to 25 units twice daily, add 6 units of NovoLog with meals-change to resistant scale SSI.  Reassess on 8/12.  Recent Labs    07/29/21 1636 07/29/21 2321 07/30/21 0816  GLUCAP 224* 204* 95    History of multiple myeloma: Claims she is getting chemotherapy every month at [facility].    GI prophylaxis: PPI  ABG: No results found for: PHART, PCO2ART, PO2ART, HCO3, TCO2, ACIDBASEDEF, O2SAT  Vent Settings: N/A    Condition - Stable  Family Communication  :  Daughter-Teresa [phone] on 8/11-unable to leave a voicemail.  Code Status :  Full Code  Diet :  Diet Order             Diet Carb Modified Fluid consistency: Thin; Room service appropriate? Yes  Diet effective now                    Disposition Plan  :   Status is: Inpatient  Remains inpatient appropriate because:Inpatient level of care appropriate due to severity of illness  Dispo: The patient is from: Home              Anticipated d/c is to: Home              Patient currently is not medically stable to d/c.   Difficult to place patient No     Barriers to discharge: Hypoxia requiring O2 supplementation/complete 5 days of IV Remdesivir  Antimicorbials  :    Anti-infectives (From  admission, onward)    Start     Dose/Rate Route Frequency Ordered Stop   07/30/21 1000  remdesivir 100 mg in sodium chloride 0.9 % 100 mL IVPB       See Hyperspace for full Linked Orders Report.   100 mg 200 mL/hr over 30 Minutes Intravenous Daily 07/29/21 1142 08/03/21 0959   07/30/21 1000  remdesivir 100 mg in sodium chloride 0.9 % 100 mL IVPB  Status:  Discontinued       See Hyperspace for full Linked Orders Report.   100 mg 200 mL/hr over 30 Minutes Intravenous Daily 07/29/21 1208 07/29/21 1222   07/30/21 1000  cefTRIAXone (ROCEPHIN) 1 g in sodium chloride 0.9 % 100 mL IVPB        1 g 200 mL/hr over 30 Minutes Intravenous Every 24 hours 07/30/21 0848     07/29/21 1300  remdesivir 200 mg in sodium chloride 0.9% 250 mL IVPB       See Hyperspace for full Linked Orders Report.   200 mg 580 mL/hr over 30 Minutes Intravenous Once 07/29/21 1142 07/29/21 1637   07/29/21 1215  remdesivir 200 mg in sodium chloride 0.9% 250 mL IVPB  Status:  Discontinued       See Hyperspace for full Linked Orders Report.   200 mg 580 mL/hr over 30 Minutes Intravenous Once 07/29/21 1208 07/29/21 1222       Inpatient Medications  Scheduled Meds:  vitamin C  500 mg Oral Daily   docusate sodium  100 mg Oral BID   enoxaparin (LOVENOX) injection  40 mg Subcutaneous Q24H   insulin aspart  0-20 Units Subcutaneous TID WC   insulin aspart  0-5 Units Subcutaneous QHS   insulin aspart  6 Units Subcutaneous TID WC   insulin detemir  25 Units Subcutaneous BID   methylPREDNISolone (  SOLU-MEDROL) injection  35 mg Intravenous Q12H   pantoprazole  40 mg Oral Daily   sodium chloride flush  3 mL Intravenous Q12H   zinc sulfate  220 mg Oral Daily   Continuous Infusions:  cefTRIAXone (ROCEPHIN)  IV 1 g (07/30/21 1007)   remdesivir 100 mg in NS 100 mL 100 mg (07/30/21 1010)   PRN Meds:.acetaminophen, albuterol, chlorpheniramine-HYDROcodone, guaiFENesin-dextromethorphan, ondansetron **OR** ondansetron (ZOFRAN) IV,  polyethylene glycol   Time Spent in minutes  35   See all Orders from today for further details   Oren Binet M.D on 07/30/2021 at 10:37 AM  To page go to [email or web address] - use universal password  [facility] -  Office  [phone]    Objective:   Vitals:   07/29/21 1903 07/29/21 1955 07/30/21 0437 07/30/21 0815  BP: 140/72 123/60 (!) 108/52 (!) 126/58  Pulse: (!) 106 97 73 70  Resp: _0 Temp: 98.8 F (37.1 C) 98.1 F (36.7 C) 98 F (36.7 C) 97.8 F (36.6 C)  TempSrc:      SpO2: (!) 89% 90% 90% 92%  Weight:      Height:        Wt Readings from Last 3 Encounters:  07/29/21 63.5 kg  11/23/19 63.5 kg  07/05/18 61.2 kg     Intake/Output Summary (Last 24 hours) at 07/30/2021 1037 Last data filed at 07/29/2021 1637 Gross per 24 hour  Intake 250 ml  Output --  Net 250 ml     Physical Exam Gen Exam:Alert awake-not in any distress HEENT:atraumatic, normocephalic Chest: B/L clear to auscultation anteriorly CVS:S1S2 regular Abdomen:soft non tender, non distended Extremities:no edema Neurology: Non focal Skin: no rash   Data Review:    CBC Recent Labs  Lab 07/29/21 0846 07/29/21 1451 07/30/21 0628  WBC 10.9* 12.4* 8.2  HGB 12.3 13.9 11.4*  HCT 35.2* 40.2 32.9*  PLT 206 245 239  MCV 83.4 85.2 83.1  MCH 29.1 29.4 28.8  MCHC 34.9 34.6 34.7  RDW 13.0 13.0 12.9  LYMPHSABS  --   --  2.2  MONOABS  --   --  0.7  EOSABS  --   --  0.0  BASOSABS  --   --  0.0    Chemistries  Recent Labs  Lab 07/29/21 0846 07/29/21 1451 07/30/21 0628  NA 134*  --  137  K 4.0  --  3.5  CL 97*  --  102  CO2 26  --  27  GLUCOSE 262*  --  95  BUN 18  --  21*  CREATININE 0.78 0.77 1.01*  CALCIUM 8.4*  --  8.3*  AST 19  --  18  ALT 14  --  14  ALKPHOS 109  --  112  BILITOT 0.6  --  0.5   ------------------------------------------------------------------------------------------------------------------ No results for input(s): CHOL, HDL, LDLCALC,  TRIG, CHOLHDL, LDLDIRECT in the last 72 hours.  Lab Results  Component Value Date   HGBA1C 12.9 (H) 07/29/2021   ------------------------------------------------------------------------------------------------------------------ No results for input(s): TSH, T4TOTAL, T3FREE, THYROIDAB in the last 72 hours.  Invalid input(s): FREET3 ------------------------------------------------------------------------------------------------------------------ Recent Labs    07/29/21 1451 07/30/21 0628  FERRITIN 142 125    Coagulation profile No results for input(s): INR, PROTIME in the last 168 hours.  Recent Labs    07/29/21 1451 07/30/21 0628  DDIMER 0.69* 0.56*    Cardiac Enzymes No results for input(s): CKMB, TROPONINI, MYOGLOBIN in the last 168 hours.  Invalid input(s): CK ------------------------------------------------------------------------------------------------------------------  No results found for: BNP  Micro Results Recent Results (from the past 240 hour(s))  Resp Panel by RT-PCR (Flu A&B, Covid)     Status: Abnormal   Collection Time: 07/29/21  9:43 AM   Specimen: Nasopharyngeal(NP) swabs in vial transport medium  Result Value Ref Range Status   SARS Coronavirus 2 by RT PCR POSITIVE (A) NEGATIVE Final    Comment: RESULT CALLED TO, READ BACK BY AND VERIFIED WITH: KELLY PENDLETON 8/10 @ 6073 BY S. BEARD (NOTE) SARS-CoV-2 target nucleic acids are DETECTED.  The SARS-CoV-2 RNA is generally detectable in upper respiratory specimens during the acute phase of infection. Positive results are indicative of the presence of the identified virus, but do not rule out bacterial infection or co-infection with other pathogens not detected by the test. Clinical correlation with patient history and other diagnostic information is necessary to determine patient infection status. The expected result is Negative.  Fact Sheet for  Patients: EntrepreneurPulse.com.au  Fact Sheet for Healthcare Providers: IncredibleEmployment.be  This test is not yet approved or cleared by the [place] FDA and  has been authorized for detection and/or diagnosis of SARS-CoV-2 by FDA under an Emergency Use Authorization (EUA).  This EUA will remain in effect (meaning this test  can be used) for the duration of  the COVID-19 declaration under Section 564(b)(1) of the Act, 21 U.S.C. section 360bbb-3(b)(1), unless the authorization is terminated or revoked sooner.     Influenza A by PCR NEGATIVE NEGATIVE Final   Influenza B by PCR NEGATIVE NEGATIVE Final    Comment: (NOTE) The Xpert Xpress SARS-CoV-2/FLU/RSV plus assay is intended as an aid in the diagnosis of influenza from Nasopharyngeal swab specimens and should not be used as a sole basis for treatment. Nasal washings and aspirates are unacceptable for Xpert Xpress SARS-CoV-2/FLU/RSV testing.  Fact Sheet for Patients: EntrepreneurPulse.com.au  Fact Sheet for Healthcare Providers: IncredibleEmployment.be  This test is not yet approved or cleared by the [place] FDA and has been authorized for detection and/or diagnosis of SARS-CoV-2 by FDA under an Emergency Use Authorization (EUA). This EUA will remain in effect (meaning this test can be used) for the duration of the COVID-19 declaration under Section 564(b)(1) of the Act, 21 U.S.C. section 360bbb-3(b)(1), unless the authorization is terminated or revoked.  Performed at [facility], [address] [place], [place] [address]   Culture, blood (Routine X 2) w Reflex to ID Panel     Status: None (Preliminary result)   Collection Time: 07/29/21  2:50 PM   Specimen: BLOOD  Result Value Ref Range Status   Specimen Description BLOOD BLOOD RIGHT HAND  Final   Special Requests   Final    BOTTLES DRAWN AEROBIC AND ANAEROBIC Blood Culture  adequate volume   Culture   Final    NO GROWTH < 24 HOURS Performed at [facility], [address], [place], [place] [address]    Report Status PENDING  Incomplete  Culture, blood (Routine X 2) w Reflex to ID Panel     Status: None (Preliminary result)   Collection Time: 07/29/21  3:02 PM   Specimen: BLOOD  Result Value Ref Range Status   Specimen Description BLOOD LEFT ANTECUBITAL  Final   Special Requests   Final    BOTTLES DRAWN AEROBIC AND ANAEROBIC Blood Culture results may not be optimal due to an excessive volume of blood received in culture bottles   Culture   Final    NO GROWTH < 24  HOURS Performed at [facility], [address], [place], [place] [address]    Report Status PENDING  Incomplete    Radiology Reports DG Chest 2 View  Result Date: 07/29/2021 CLINICAL DATA:  55 year old female with dehydration, nausea vomiting, abdominal pain. EXAM: CHEST - 2 VIEW COMPARISON:  Chest CTA 11/23/2019 and earlier. FINDINGS: PA and lateral views. Lower lung volumes compared to 2020. Patchy, peripheral and indistinct pulmonary opacities are asymmetric and more extensive in the left lung. No pneumothorax, pulmonary edema or pleural effusion. Mediastinal contours remain normal. Visualized tracheal air column is within normal limits. No acute osseous abnormality identified. Negative visible bowel gas pattern. IMPRESSION: Aymmetric patchy and indistinct pulmonary opacities, suspicious for acute viral/atypical respiratory infection. No pleural effusion. Electronically Signed   By: Genevie Ann M.D.   On: 07/29/2021 09:54

## 2021-07-31 DIAGNOSIS — C9000 Multiple myeloma not having achieved remission: Secondary | ICD-10-CM

## 2021-07-31 LAB — CBC WITH DIFFERENTIAL/PLATELET
Abs Immature Granulocytes: 0.02 10*3/uL (ref 0.00–0.07)
Basophils Absolute: 0 10*3/uL (ref 0.0–0.1)
Basophils Relative: 0 %
Eosinophils Absolute: 0 10*3/uL (ref 0.0–0.5)
Eosinophils Relative: 0 %
HCT: 34.8 % — ABNORMAL LOW (ref 36.0–46.0)
Hemoglobin: 12.5 g/dL (ref 12.0–15.0)
Immature Granulocytes: 0 %
Lymphocytes Relative: 14 %
Lymphs Abs: 0.7 10*3/uL (ref 0.7–4.0)
MCH: 30.1 pg (ref 26.0–34.0)
MCHC: 35.9 g/dL (ref 30.0–36.0)
MCV: 83.9 fL (ref 80.0–100.0)
Monocytes Absolute: 0.1 10*3/uL (ref 0.1–1.0)
Monocytes Relative: 2 %
Neutro Abs: 4.2 10*3/uL (ref 1.7–7.7)
Neutrophils Relative %: 84 %
Platelets: 274 10*3/uL (ref 150–400)
RBC: 4.15 MIL/uL (ref 3.87–5.11)
RDW: 13.2 % (ref 11.5–15.5)
WBC: 5 10*3/uL (ref 4.0–10.5)
nRBC: 0 % (ref 0.0–0.2)

## 2021-07-31 LAB — COMPREHENSIVE METABOLIC PANEL
ALT: 15 U/L (ref 0–44)
AST: 16 U/L (ref 15–41)
Albumin: 2.9 g/dL — ABNORMAL LOW (ref 3.5–5.0)
Alkaline Phosphatase: 118 U/L (ref 38–126)
Anion gap: 8 (ref 5–15)
BUN: 27 mg/dL — ABNORMAL HIGH (ref 6–20)
CO2: 25 mmol/L (ref 22–32)
Calcium: 8.6 mg/dL — ABNORMAL LOW (ref 8.9–10.3)
Chloride: 105 mmol/L (ref 98–111)
Creatinine, Ser: 0.78 mg/dL (ref 0.44–1.00)
GFR, Estimated: >60 mL/min (ref >60)
Glucose, Bld: 185 mg/dL — ABNORMAL HIGH (ref 70–99)
Potassium: 4.2 mmol/L (ref 3.5–5.1)
Sodium: 138 mmol/L (ref 135–145)
Total Bilirubin: 0.5 mg/dL (ref 0.3–1.2)
Total Protein: 6.1 g/dL — ABNORMAL LOW (ref 6.5–8.1)

## 2021-07-31 LAB — FERRITIN: Ferritin: 164 ng/mL (ref 11–307)

## 2021-07-31 LAB — GLUCOSE, CAPILLARY
Glucose-Capillary: 176 mg/dL — ABNORMAL HIGH (ref 70–99)
Glucose-Capillary: 202 mg/dL — ABNORMAL HIGH (ref 70–99)
Glucose-Capillary: 247 mg/dL — ABNORMAL HIGH (ref 70–99)
Glucose-Capillary: 264 mg/dL — ABNORMAL HIGH (ref 70–99)

## 2021-07-31 LAB — D-DIMER, QUANTITATIVE: D-Dimer, Quant: 0.66 ug/mL-FEU — ABNORMAL HIGH (ref 0.00–0.50)

## 2021-07-31 LAB — C-REACTIVE PROTEIN: CRP: 14.7 mg/dL — ABNORMAL HIGH (ref <1.0)

## 2021-07-31 NOTE — Plan of Care (Signed)
  Problem: Respiratory: Goal: Will maintain a patent airway Outcome: Progressing   Problem: Respiratory: Goal: Complications related to the disease process, condition or treatment will be avoided or minimized Outcome: Progressing   

## 2021-07-31 NOTE — Progress Notes (Signed)
PROGRESS NOTE                                                                                                                                                                                                             Patient Demographics:    Ruth Walters, is a 55 y.o. female, DOB - [birth year], NIO:[id number]  Outpatient Primary MD for the patient is Pcp, No   Admit date - 07/29/2021   LOS - 2  Chief Complaint  Patient presents with   Dehydration       Brief Narrative: Patient is a 55 y.o. female with PMHx of multiple myeloma-getting chemotherapy at [facility], insulin-dependent DM-2, HTN-who presented with cough, nausea, vomiting, diarrhea for the past several days.  She was found to have COVID 19 pneumonia.  See below for further details.  COVID-19 vaccinated status: Vaccinated x1 booster.  Significant Events: 8/10>> Admit to [facility] for COVID-19 related pneumonia.  Significant studies: 8/10>>Chest x-ray: Bilateral patchy infiltrates (personally reviewed)  COVID-19 medications: Steroids: 8/10>> Remdesivir: 8/10>>  Antibiotics: Rocephin: 8/10>>  Microbiology data: 8/10 >>blood culture: No growth  Procedures: None  Consults: None  DVT prophylaxis: enoxaparin (LOVENOX) injection 40 mg Start: 07/29/21 2200     Subjective:   Coughing is better-on room air this morning.  Overall much better per patient.  Note-interview conducted with iPad translator at bedside.   Assessment  & Plan :   Acute Hypoxic Resp Failure due to Covid 19 Viral pneumonia: Hypoxia has resolved-on room air-CRP elevated but downtrending-given immunocompromised status-continue steroid/Remdesivir.    Fever: afebrile O2 requirements:  SpO2: 92 %   COVID-19 Labs: Recent Labs    07/29/21 1451 07/30/21 0628 07/31/21 0626  DDIMER 0.69* 0.56* 0.66*  FERRITIN 142 125 164  LDH 196*  --   --   CRP 21.2* 20.1* 14.7*     No results  found for: BNP  Recent Labs  Lab 07/29/21 1451  PROCALCITON 0.11     Lab Results  Component Value Date   SARSCOV2NAA POSITIVE (A) 07/29/2021   SARSCOV2NAA NOT DETECTED 11/23/2019       Prone/Incentive Spirometry: encouraged incentive spirometry use 3-4/hour.  Asymptomatic bacteriuria: No UTI symptoms-immunocompromised-urine culture never done-will likely be sterile as she is already on Rocephin.  We will plan on empiric treatment with Rocephin x3 days.  HTN: BP stable-resume lisinopril when  able.  Insulin-dependent DM-2 with uncontrolled hyperglycemia (A1c 12.9): CBGs stable but slightly on the higher side-continue Levemir 25 units twice daily, 6 units of NovoLog with meals and resistant SSI-reassess/readjust on 8/13.    Recent Labs    07/30/21 2016 07/31/21 0815 07/31/21 1155  GLUCAP 224* 176* 202*     History of multiple myeloma: Claims she is getting chemotherapy every month at [facility].    GI prophylaxis: PPI  ABG: No results found for: PHART, PCO2ART, PO2ART, HCO3, TCO2, ACIDBASEDEF, O2SAT  Vent Settings: N/A    Condition - Stable  Family Communication  :  Daughter-Teresa [phone] on 8/11-unable to leave a voicemail.  Code Status :  Full Code  Diet :  Diet Order             Diet Carb Modified Fluid consistency: Thin; Room service appropriate? Yes  Diet effective now                    Disposition Plan  :   Status is: Inpatient  Remains inpatient appropriate because:Inpatient level of care appropriate due to severity of illness  Dispo: The patient is from: Home              Anticipated d/c is to: Home              Patient currently is not medically stable to d/c.   Difficult to place patient No     Barriers to discharge: Hypoxia requiring O2 supplementation/complete 5 days of IV Remdesivir  Antimicorbials  :    Anti-infectives (From admission, onward)    Start     Dose/Rate Route Frequency Ordered Stop   07/30/21 1000   remdesivir 100 mg in sodium chloride 0.9 % 100 mL IVPB       See Hyperspace for full Linked Orders Report.   100 mg 200 mL/hr over 30 Minutes Intravenous Daily 07/29/21 1142 08/03/21 0959   07/30/21 1000  remdesivir 100 mg in sodium chloride 0.9 % 100 mL IVPB  Status:  Discontinued       See Hyperspace for full Linked Orders Report.   100 mg 200 mL/hr over 30 Minutes Intravenous Daily 07/29/21 1208 07/29/21 1222   07/30/21 1000  cefTRIAXone (ROCEPHIN) 1 g in sodium chloride 0.9 % 100 mL IVPB        1 g 200 mL/hr over 30 Minutes Intravenous Every 24 hours 07/30/21 0848 08/01/21 2359   07/29/21 1300  remdesivir 200 mg in sodium chloride 0.9% 250 mL IVPB       See Hyperspace for full Linked Orders Report.   200 mg 580 mL/hr over 30 Minutes Intravenous Once 07/29/21 1142 07/29/21 1637   07/29/21 1215  remdesivir 200 mg in sodium chloride 0.9% 250 mL IVPB  Status:  Discontinued       See Hyperspace for full Linked Orders Report.   200 mg 580 mL/hr over 30 Minutes Intravenous Once 07/29/21 1208 07/29/21 1222       Inpatient Medications  Scheduled Meds:  vitamin C  500 mg Oral Daily   docusate sodium  100 mg Oral BID   enoxaparin (LOVENOX) injection  40 mg Subcutaneous Q24H   insulin aspart  0-20 Units Subcutaneous TID WC   insulin aspart  0-5 Units Subcutaneous QHS   insulin aspart  6 Units Subcutaneous TID WC   insulin detemir  25 Units Subcutaneous BID   methylPREDNISolone (SOLU-MEDROL) injection  35 mg Intravenous Q12H   pantoprazole  40 mg  Oral Daily   sodium chloride flush  3 mL Intravenous Q12H   zinc sulfate  220 mg Oral Daily   Continuous Infusions:  cefTRIAXone (ROCEPHIN)  IV 1 g (07/31/21 0850)   remdesivir 100 mg in NS 100 mL 100 mg (07/31/21 1013)   PRN Meds:.acetaminophen, albuterol, chlorpheniramine-HYDROcodone, guaiFENesin-dextromethorphan, ondansetron **OR** ondansetron (ZOFRAN) IV, polyethylene glycol   Time Spent in minutes  25   See all Orders from today for  further details   Oren Binet M.D on 07/31/2021 at 1:33 PM  To page go to [email or web address] - use universal password  [facility] -  Office  [phone]    Objective:   Vitals:   07/30/21 2017 07/31/21 0447 07/31/21 0816 07/31/21 1209  BP: 119/70 131/80 135/77 133/70  Pulse: 87 77 96 81  Resp: _0 Temp: 98.1 F (36.7 C) 98.6 F (37 C) 98.1 F (36.7 C) 98.2 F (36.8 C)  TempSrc: Oral     SpO2: 95% 91% (!) 89% 92%  Weight:      Height:        Wt Readings from Last 3 Encounters:  07/29/21 63.5 kg  11/23/19 63.5 kg  07/05/18 61.2 kg    No intake or output data in the 24 hours ending 07/31/21 1333    Physical Exam Gen Exam:Alert awake-not in any distress HEENT:atraumatic, normocephalic Chest: B/L clear to auscultation anteriorly CVS:S1S2 regular Abdomen:soft non tender, non distended Extremities:no edema Neurology: Non focal Skin: no rash    Data Review:    CBC Recent Labs  Lab 07/29/21 0846 07/29/21 1451 07/30/21 0628 07/31/21 0626  WBC 10.9* 12.4* 8.2 5.0  HGB 12.3 13.9 11.4* 12.5  HCT 35.2* 40.2 32.9* 34.8*  PLT 206 245 239 274  MCV 83.4 85.2 83.1 83.9  MCH 29.1 29.4 28.8 30.1  MCHC 34.9 34.6 34.7 35.9  RDW 13.0 13.0 12.9 13.2  LYMPHSABS  --   --  2.2 0.7  MONOABS  --   --  0.7 0.1  EOSABS  --   --  0.0 0.0  BASOSABS  --   --  0.0 0.0     Chemistries  Recent Labs  Lab 07/29/21 0846 07/29/21 1451 07/30/21 0628 07/31/21 0626  NA 134*  --  137 138  K 4.0  --  3.5 4.2  CL 97*  --  102 105  CO2 26  --  27 25  GLUCOSE 262*  --  95 185*  BUN 18  --  21* 27*  CREATININE 0.78 0.77 1.01* 0.78  CALCIUM 8.4*  --  8.3* 8.6*  AST 19  --  18 16  ALT 14  --  14 15  ALKPHOS 109  --  112 118  BILITOT 0.6  --  0.5 0.5    ------------------------------------------------------------------------------------------------------------------ No results for input(s): CHOL, HDL, LDLCALC, TRIG, CHOLHDL, LDLDIRECT in the last 72  hours.  Lab Results  Component Value Date   HGBA1C 12.9 (H) 07/29/2021   ------------------------------------------------------------------------------------------------------------------ No results for input(s): TSH, T4TOTAL, T3FREE, THYROIDAB in the last 72 hours.  Invalid input(s): FREET3 ------------------------------------------------------------------------------------------------------------------ Recent Labs    07/30/21 0628 07/31/21 0626  FERRITIN 125 164     Coagulation profile No results for input(s): INR, PROTIME in the last 168 hours.  Recent Labs    07/30/21 0628 07/31/21 0626  DDIMER 0.56* 0.66*     Cardiac Enzymes No results for input(s): CKMB, TROPONINI, MYOGLOBIN in the last 168 hours.  Invalid input(s): CK ------------------------------------------------------------------------------------------------------------------ No results  found for: BNP  Micro Results Recent Results (from the past 240 hour(s))  Resp Panel by RT-PCR (Flu A&B, Covid)     Status: Abnormal   Collection Time: 07/29/21  9:43 AM   Specimen: Nasopharyngeal(NP) swabs in vial transport medium  Result Value Ref Range Status   SARS Coronavirus 2 by RT PCR POSITIVE (A) NEGATIVE Final    Comment: RESULT CALLED TO, READ BACK BY AND VERIFIED WITH: KELLY PENDLETON 8/10 @ 4431 BY [address] BEARD (NOTE) SARS-CoV-2 target nucleic acids are DETECTED.  The SARS-CoV-2 RNA is generally detectable in upper respiratory specimens during the acute phase of infection. Positive results are indicative of the presence of the identified virus, but do not rule out bacterial infection or co-infection with other pathogens not detected by the test. Clinical correlation with patient history and other diagnostic information is necessary to determine patient infection status. The expected result is Negative.  Fact Sheet for Patients: EntrepreneurPulse.com.au  Fact Sheet for Healthcare  Providers: IncredibleEmployment.be  This test is not yet approved or cleared by the [place] FDA and  has been authorized for detection and/or diagnosis of SARS-CoV-2 by FDA under an Emergency Use Authorization (EUA).  This EUA will remain in effect (meaning this test  can be used) for the duration of  the COVID-19 declaration under Section 564(b)(1) of the Act, 21 U.S.C. section 360bbb-3(b)(1), unless the authorization is terminated or revoked sooner.     Influenza A by PCR NEGATIVE NEGATIVE Final   Influenza B by PCR NEGATIVE NEGATIVE Final    Comment: (NOTE) The Xpert Xpress SARS-CoV-2/FLU/RSV plus assay is intended as an aid in the diagnosis of influenza from Nasopharyngeal swab specimens and should not be used as a sole basis for treatment. Nasal washings and aspirates are unacceptable for Xpert Xpress SARS-CoV-2/FLU/RSV testing.  Fact Sheet for Patients: EntrepreneurPulse.com.au  Fact Sheet for Healthcare Providers: IncredibleEmployment.be  This test is not yet approved or cleared by the [place] FDA and has been authorized for detection and/or diagnosis of SARS-CoV-2 by FDA under an Emergency Use Authorization (EUA). This EUA will remain in effect (meaning this test can be used) for the duration of the COVID-19 declaration under Section 564(b)(1) of the Act, 21 U.S.C. section 360bbb-3(b)(1), unless the authorization is terminated or revoked.  Performed at [facility], [address] [place], [place] [address]   Culture, blood (Routine X 2) w Reflex to ID Panel     Status: None (Preliminary result)   Collection Time: 07/29/21  2:50 PM   Specimen: BLOOD  Result Value Ref Range Status   Specimen Description BLOOD BLOOD RIGHT HAND  Final   Special Requests   Final    BOTTLES DRAWN AEROBIC AND ANAEROBIC Blood Culture adequate volume   Culture   Final    NO GROWTH 2 DAYS Performed at [facility], [address], [place], [place] [address]    Report Status PENDING  Incomplete  Culture, blood (Routine X 2) w Reflex to ID Panel     Status: None (Preliminary result)   Collection Time: 07/29/21  3:02 PM   Specimen: BLOOD  Result Value Ref Range Status   Specimen Description BLOOD LEFT ANTECUBITAL  Final   Special Requests   Final    BOTTLES DRAWN AEROBIC AND ANAEROBIC Blood Culture results may not be optimal due to an excessive volume of blood received in culture bottles   Culture   Final    NO GROWTH 2 DAYS Performed at [facility]  [facility], [address], [place], [place] [address]    Report Status PENDING  Incomplete    Radiology Reports DG Chest 2 View  Result Date: 07/29/2021 CLINICAL DATA:  55 year old female with dehydration, nausea vomiting, abdominal pain. EXAM: CHEST - 2 VIEW COMPARISON:  Chest CTA 11/23/2019 and earlier. FINDINGS: PA and lateral views. Lower lung volumes compared to 2020. Patchy, peripheral and indistinct pulmonary opacities are asymmetric and more extensive in the left lung. No pneumothorax, pulmonary edema or pleural effusion. Mediastinal contours remain normal. Visualized tracheal air column is within normal limits. No acute osseous abnormality identified. Negative visible bowel gas pattern. IMPRESSION: Aymmetric patchy and indistinct pulmonary opacities, suspicious for acute viral/atypical respiratory infection. No pleural effusion. Electronically Signed   By: Genevie Ann M.D.   On: 07/29/2021 09:54

## 2021-08-01 LAB — CBC WITH DIFFERENTIAL/PLATELET
Abs Immature Granulocytes: 0.07 10*3/uL (ref 0.00–0.07)
Basophils Absolute: 0 10*3/uL (ref 0.0–0.1)
Basophils Relative: 0 %
Eosinophils Absolute: 0 10*3/uL (ref 0.0–0.5)
Eosinophils Relative: 0 %
HCT: 35 % — ABNORMAL LOW (ref 36.0–46.0)
Hemoglobin: 12.3 g/dL (ref 12.0–15.0)
Immature Granulocytes: 1 %
Lymphocytes Relative: 10 %
Lymphs Abs: 1.1 10*3/uL (ref 0.7–4.0)
MCH: 29.4 pg (ref 26.0–34.0)
MCHC: 35.1 g/dL (ref 30.0–36.0)
MCV: 83.5 fL (ref 80.0–100.0)
Monocytes Absolute: 0.5 10*3/uL (ref 0.1–1.0)
Monocytes Relative: 5 %
Neutro Abs: 8.8 10*3/uL — ABNORMAL HIGH (ref 1.7–7.7)
Neutrophils Relative %: 84 %
Platelets: 364 10*3/uL (ref 150–400)
RBC: 4.19 MIL/uL (ref 3.87–5.11)
RDW: 13.3 % (ref 11.5–15.5)
WBC: 10.4 10*3/uL (ref 4.0–10.5)
nRBC: 0 % (ref 0.0–0.2)

## 2021-08-01 LAB — GLUCOSE, CAPILLARY
Glucose-Capillary: 228 mg/dL — ABNORMAL HIGH (ref 70–99)
Glucose-Capillary: 183 mg/dL — ABNORMAL HIGH (ref 70–99)
Glucose-Capillary: 215 mg/dL — ABNORMAL HIGH (ref 70–99)
Glucose-Capillary: 295 mg/dL — ABNORMAL HIGH (ref 70–99)

## 2021-08-01 LAB — COMPREHENSIVE METABOLIC PANEL
ALT: 17 U/L (ref 0–44)
AST: 17 U/L (ref 15–41)
Albumin: 2.8 g/dL — ABNORMAL LOW (ref 3.5–5.0)
Alkaline Phosphatase: 111 U/L (ref 38–126)
Anion gap: 11 (ref 5–15)
BUN: 33 mg/dL — ABNORMAL HIGH (ref 6–20)
CO2: 24 mmol/L (ref 22–32)
Calcium: 8.7 mg/dL — ABNORMAL LOW (ref 8.9–10.3)
Chloride: 104 mmol/L (ref 98–111)
Creatinine, Ser: 0.84 mg/dL (ref 0.44–1.00)
GFR, Estimated: >60 mL/min (ref >60)
Glucose, Bld: 241 mg/dL — ABNORMAL HIGH (ref 70–99)
Potassium: 4.1 mmol/L (ref 3.5–5.1)
Sodium: 139 mmol/L (ref 135–145)
Total Bilirubin: 0.6 mg/dL (ref 0.3–1.2)
Total Protein: 5.9 g/dL — ABNORMAL LOW (ref 6.5–8.1)

## 2021-08-01 LAB — D-DIMER, QUANTITATIVE: D-Dimer, Quant: 0.47 ug/mL-FEU (ref 0.00–0.50)

## 2021-08-01 LAB — C-REACTIVE PROTEIN: CRP: 8.4 mg/dL — ABNORMAL HIGH (ref <1.0)

## 2021-08-01 MED ORDER — TRAZODONE HCL 50 MG PO TABS
25.0000 mg | ORAL_TABLET | Freq: Every evening | ORAL | Status: DC | PRN
  Administered 2021-08-01: 25 mg via ORAL
  Filled 2021-08-01: qty 1

## 2021-08-01 NOTE — Evaluation (Signed)
Physical Therapy Evaluation Patient Details Name: Ruth Walters MRN: [id number] DOB: [birth year] Today's Date: 08/01/2021   History of Present Illness  Pt is a 55 y/o F who presents to ED with 1 week of cough, SOB, & congestion. Pt found to have covid PNA. 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 Pt reports she's mod I with SPC at home where she lives with her 55 y/o and 55 y/o but does endorse 2 falls in the past 6 months. On this date, pt is able to complete sit<>stand transfer & ambulate in room with [facility] with mod I; pt with 1 slight LOB but able to correct without assistance. Pt reports she feels she's at her baseline in regards to mobility & does not have any concerns. Pt does not present with acute PT needs at this time. PT to sign off. Please re-consult if new needs arise.     Follow Up Recommendations No PT follow up    Equipment Recommendations  None recommended by PT    Recommendations for Other Services       Precautions / Restrictions Precautions Precautions: None Restrictions Weight Bearing Restrictions: No      Mobility  Bed Mobility               General bed mobility comments: not observed, pt received & left sitting in recliner    Transfers Overall transfer level: Modified independent Equipment used: Straight cane             General transfer comment: sit<>stand  Ambulation/Gait Ambulation/Gait assistance: Modified independent (Device/Increase time) Gait Distance (Feet):  (4 laps to door & back equating to ~120 ft) Assistive device: Straight cane   Gait velocity: decreased   General Gait Details: slightly guarded gait, 1 mild imbalance but pt able to self correct  Stairs            Wheelchair Mobility    Modified  Rankin (Stroke Patients Only)       Balance Overall balance assessment: Modified Independent (with SPC)                                           Pertinent Vitals/Pain Pain Assessment: No/denies pain    Home Living Family/patient expects to be discharged to:: Private residence Living Arrangements: Children (37 y/o & 18 y/o) Available Help at Discharge: Family;Available PRN/intermittently Type of Home: Mobile home Home Access: Ramped entrance     Home Layout: One level Home Equipment: Cane - quad      Prior Function Level of Independence: Independent with assistive device(s)         Comments: Mod I with cane; bathes, dresses, doesn't drive. Endorses 2 falls in the past 6 months.     Hand Dominance        Extremity/Trunk Assessment   Upper Extremity Assessment Upper Extremity Assessment: Overall WFL for tasks assessed    Lower Extremity Assessment Lower Extremity Assessment: Overall WFL for tasks assessed       Communication   Communication: No difficulties;Interpreter utilized Cori Razor [phone], Wilhemena Durie [phone] via [place] interpreter services)  Cognition Arousal/Alertness: Awake/alert Behavior During Therapy: WFL for tasks assessed/performed Overall Cognitive Status: Within Functional Limits for tasks assessed  General Comments      Exercises     Assessment/Plan    PT Assessment Patent does not need any further PT services  PT Problem List         PT Treatment Interventions      PT Goals (Current goals can be found in the Care Plan section)  Acute Rehab PT Goals Patient Stated Goal: go home PT Goal Formulation: With patient Time For Goal Achievement: 08/15/21 Potential to Achieve Goals: Good    Frequency     Barriers to discharge        Co-evaluation               AM-PAC PT "6 Clicks" Mobility  Outcome Measure Help needed turning from your back to your side  while in a flat bed without using bedrails?: None Help needed moving from lying on your back to sitting on the side of a flat bed without using bedrails?: None Help needed moving to and from a bed to a chair (including a wheelchair)?: None Help needed standing up from a chair using your arms (e.g., wheelchair or bedside chair)?: None Help needed to walk in hospital room?: None Help needed climbing 3-5 steps with a railing? : None 6 Click Score: 24    End of Session   Activity Tolerance: Patient tolerated treatment well Patient left: in chair;with call bell/phone within reach        Time: 1016-1035 PT Time Calculation (min) (ACUTE ONLY): 19 min   Charges:   PT Evaluation $PT Eval Low Complexity: 1 Low          Lavone Nian, PT, DPT 08/01/21, 11:46 AM   Waunita Schooner 08/01/2021, 11:27 AM

## 2021-08-01 NOTE — Progress Notes (Signed)
                                  PROGRESS NOTE                                                                                                                                                                                                             Patient Demographics:    Ruth Walters, is a 55 y.o. female, DOB - 05/22/1966, MRN:[id number]  Outpatient Primary MD for the patient is Pcp, No   Admit date - 07/29/2021   LOS - 3  Chief Complaint  Patient presents with   Dehydration       Brief Narrative: Patient is a 55 y.o. female with PMHx of multiple myeloma-getting chemotherapy at [facility], insulin-dependent DM-2, HTN-who presented with cough, nausea, vomiting, diarrhea for the past several days.  She was found to have COVID 19 pneumonia.  See below for further details.  COVID-19 vaccinated status: Vaccinated x1 booster.  Significant Events: 8/10>> Admit to [facility] for COVID-19 related pneumonia.  Significant studies: 8/10>>Chest x-ray: Bilateral patchy infiltrates (personally reviewed)  COVID-19 medications: Steroids: 8/10>> Remdesivir: 8/10>>  Antibiotics: Rocephin: 8/10>>  Microbiology data: 8/10 >>blood culture: No growth  Procedures: None  Consults: None  DVT prophylaxis: enoxaparin (LOVENOX) injection 40 mg Start: 07/29/21 2200     Subjective:   Patient in bed, appears comfortable, denies any headache, no fever, no chest pain or pressure, mild cough and much improved shortness of breath , no abdominal pain. No new focal weakness.   Assessment  & Plan :   Acute Hypoxic Resp Failure due to Covid 19 Viral pneumonia: Hypoxia has resolved-on room air-CRP elevated but downtrending-given immunocompromised status-continue steroid/Remdesivir.  Likely DC in 1-2 days   COVID-19 Labs: Recent Labs    07/29/21 1451 07/30/21 0628 07/31/21 0626 08/01/21 0644  DDIMER 0.69* 0.56* 0.66* 0.47  FERRITIN 142 125 164  --   LDH 196*  --   --    --   CRP 21.2* 20.1* 14.7*  --     No results found for: BNP  Recent Labs  Lab 07/29/21 1451  PROCALCITON 0.11    Lab Results  Component Value Date   SARSCOV2NAA POSITIVE (A) 07/29/2021   SARSCOV2NAA NOT DETECTED 11/23/2019      Prone/Incentive Spirometry: encouraged incentive spirometry use 3-4/hour.  Asymptomatic bacteriuria: No UTI symptoms-immunocompromised-urine culture never done-will likely be sterile as she is already on Rocephin.  We will   plan on empiric treatment with Rocephin x3 days.  HTN: BP stable-resume lisinopril when able.  Insulin-dependent DM-2 with uncontrolled hyperglycemia (A1c 12.9): CBGs stable but slightly on the higher side-continue Levemir 25 units twice daily, 6 units of NovoLog with meals and resistant SSI-reassess/readjust on 8/13.    Recent Labs    07/31/21 1624 07/31/21 2005 08/01/21 0826  GLUCAP 247* 264* 228*    History of multiple myeloma: Claims she is getting chemotherapy every month at [facility].    GI prophylaxis: PPI     Condition - Stable  Family Communication  :  Daughter-Teresa [phone] on 8/11-unable to leave a voicemail.  Code Status :  Full Code  Diet :  Diet Order             Diet Carb Modified Fluid consistency: Thin; Room service appropriate? Yes  Diet effective now                    Disposition Plan  :   Status is: Inpatient  Remains inpatient appropriate because:Inpatient level of care appropriate due to severity of illness  Dispo: The patient is from: Home              Anticipated d/c is to: Home              Patient currently is not medically stable to d/c.   Difficult to place patient No  Barriers to discharge: Hypoxia requiring O2 supplementation/complete 5 days of IV Remdesivir  Inpatient Medications  Scheduled Meds:  vitamin C  500 mg Oral Daily   docusate sodium  100 mg Oral BID   enoxaparin (LOVENOX) injection  40 mg Subcutaneous Q24H   insulin aspart  0-20 Units  Subcutaneous TID WC   insulin aspart  0-5 Units Subcutaneous QHS   insulin aspart  6 Units Subcutaneous TID WC   insulin detemir  25 Units Subcutaneous BID   methylPREDNISolone (SOLU-MEDROL) injection  35 mg Intravenous Q12H   pantoprazole  40 mg Oral Daily   sodium chloride flush  3 mL Intravenous Q12H   zinc sulfate  220 mg Oral Daily   Continuous Infusions:  remdesivir 100 mg in NS 100 mL 100 mg (08/01/21 0935)   PRN Meds:.acetaminophen, albuterol, chlorpheniramine-HYDROcodone, guaiFENesin-dextromethorphan, ondansetron **OR** ondansetron (ZOFRAN) IV, polyethylene glycol, traZODone   Time Spent in minutes  25   See all Orders from today for further details   Lala Lund M.D on 08/01/2021 at 10:32 AM  To page go to [email or web address] - use universal password  [facility] -  Office  [phone]    Objective:   Vitals:   07/31/21 2058 08/01/21 0037 08/01/21 0403 08/01/21 0819  BP: (!) 144/77 140/71 130/82 132/73  Pulse: 83 78 73 80  Resp: _0 Temp: 97.8 F (36.6 C) 98 F (36.7 C) (!) 97.3 F (36.3 C) 97.8 F (36.6 C)  TempSrc: Oral  Oral Oral  SpO2: 93% 90% 94% 93%  Weight:      Height:        Wt Readings from Last 3 Encounters:  07/29/21 63.5 kg  11/23/19 63.5 kg  07/05/18 61.2 kg     Intake/Output Summary (Last 24 hours) at 08/01/2021 1032 Last data filed at 08/01/2021 0942 Gross per 24 hour  Intake 444.18 ml  Output --  Net 444.18 ml     Physical Exam  Awake Alert, No new F.N deficits, Normal affect [place].AT,PERRAL Supple Neck,No JVD, No cervical lymphadenopathy appriciated.  Symmetrical  Chest wall movement, Good air movement bilaterally, CTAB RRR,No Gallops, Rubs or new Murmurs, No Parasternal Heave +ve B.Sounds, Abd Soft, No tenderness, No organomegaly appriciated, No rebound - guarding or rigidity. No Cyanosis, Clubbing or edema, No new Rash or bruise   Data Review:    CBC Recent Labs  Lab 07/29/21 0846 07/29/21 1451  07/30/21 0628 07/31/21 0626 08/01/21 0644  WBC 10.9* 12.4* 8.2 5.0 10.4  HGB 12.3 13.9 11.4* 12.5 12.3  HCT 35.2* 40.2 32.9* 34.8* 35.0*  PLT 206 245 239 274 364  MCV 83.4 85.2 83.1 83.9 83.5  MCH 29.1 29.4 28.8 30.1 29.4  MCHC 34.9 34.6 34.7 35.9 35.1  RDW 13.0 13.0 12.9 13.2 13.3  LYMPHSABS  --   --  2.2 0.7 1.1  MONOABS  --   --  0.7 0.1 0.5  EOSABS  --   --  0.0 0.0 0.0  BASOSABS  --   --  0.0 0.0 0.0    Chemistries  Recent Labs  Lab 07/29/21 0846 07/29/21 1451 07/30/21 0628 07/31/21 0626 08/01/21 0644  NA 134*  --  137 138 139  K 4.0  --  3.5 4.2 4.1  CL 97*  --  102 105 104  CO2 26  --  _0 GLUCOSE 262*  --  95 185* 241*  BUN 18  --  21* 27* 33*  CREATININE 0.78 0.77 1.01* 0.78 0.84  CALCIUM 8.4*  --  8.3* 8.6* 8.7*  AST 19  --  _1 ALT 14  --  _2 ALKPHOS 109  --  112 118 111  BILITOT 0.6  --  0.5 0.5 0.6   ------------------------------------------------------------------------------------------------------------------ No results for input(s): CHOL, HDL, LDLCALC, TRIG, CHOLHDL, LDLDIRECT in the last 72 hours.  Lab Results  Component Value Date   HGBA1C 12.9 (H) 07/29/2021   ------------------------------------------------------------------------------------------------------------------ No results for input(s): TSH, T4TOTAL, T3FREE, THYROIDAB in the last 72 hours.  Invalid input(s): FREET3 ------------------------------------------------------------------------------------------------------------------ Recent Labs    07/30/21 0628 07/31/21 0626  FERRITIN 125 164    Coagulation profile No results for input(s): INR, PROTIME in the last 168 hours.  Recent Labs    07/31/21 0626 08/01/21 0644  DDIMER 0.66* 0.47    Cardiac Enzymes No results for input(s): CKMB, TROPONINI, MYOGLOBIN in the last 168 hours.  Invalid input(s):  CK ------------------------------------------------------------------------------------------------------------------ No results found for: BNP   Radiology Reports DG Chest 2 View  Result Date: 07/29/2021 CLINICAL DATA:  55 year old female with dehydration, nausea vomiting, abdominal pain. EXAM: CHEST - 2 VIEW COMPARISON:  Chest CTA 11/23/2019 and earlier. FINDINGS: PA and lateral views. Lower lung volumes compared to 2020. Patchy, peripheral and indistinct pulmonary opacities are asymmetric and more extensive in the left lung. No pneumothorax, pulmonary edema or pleural effusion. Mediastinal contours remain normal. Visualized tracheal air column is within normal limits. No acute osseous abnormality identified. Negative visible bowel gas pattern. IMPRESSION: Aymmetric patchy and indistinct pulmonary opacities, suspicious for acute viral/atypical respiratory infection. No pleural effusion. Electronically Signed   By: Genevie Ann M.D.   On: 07/29/2021 09:54

## 2021-08-01 NOTE — Progress Notes (Signed)
SATURATION QUALIFICATIONS: (This note is used to comply with regulatory documentation for home oxygen)  Patient Saturations on Room Air at Rest = 98%  Patient Saturations on Room Air while Ambulating = 96%   

## 2021-08-02 LAB — D-DIMER, QUANTITATIVE: D-Dimer, Quant: 0.41 ug/mL-FEU (ref 0.00–0.50)

## 2021-08-02 LAB — CBC WITH DIFFERENTIAL/PLATELET
Abs Immature Granulocytes: 0.12 10*3/uL — ABNORMAL HIGH (ref 0.00–0.07)
Basophils Absolute: 0 10*3/uL (ref 0.0–0.1)
Basophils Relative: 0 %
Eosinophils Absolute: 0 10*3/uL (ref 0.0–0.5)
Eosinophils Relative: 0 %
HCT: 35.9 % — ABNORMAL LOW (ref 36.0–46.0)
Hemoglobin: 12.5 g/dL (ref 12.0–15.0)
Immature Granulocytes: 1 %
Lymphocytes Relative: 9 %
Lymphs Abs: 0.9 10*3/uL (ref 0.7–4.0)
MCH: 29 pg (ref 26.0–34.0)
MCHC: 34.8 g/dL (ref 30.0–36.0)
MCV: 83.3 fL (ref 80.0–100.0)
Monocytes Absolute: 0.4 10*3/uL (ref 0.1–1.0)
Monocytes Relative: 4 %
Neutro Abs: 8.1 10*3/uL — ABNORMAL HIGH (ref 1.7–7.7)
Neutrophils Relative %: 86 %
Platelets: 405 10*3/uL — ABNORMAL HIGH (ref 150–400)
RBC: 4.31 MIL/uL (ref 3.87–5.11)
RDW: 13.5 % (ref 11.5–15.5)
WBC: 9.6 10*3/uL (ref 4.0–10.5)
nRBC: 0 % (ref 0.0–0.2)

## 2021-08-02 LAB — COMPREHENSIVE METABOLIC PANEL
ALT: 33 U/L (ref 0–44)
AST: 27 U/L (ref 15–41)
Albumin: 2.9 g/dL — ABNORMAL LOW (ref 3.5–5.0)
Alkaline Phosphatase: 114 U/L (ref 38–126)
Anion gap: 10 (ref 5–15)
BUN: 33 mg/dL — ABNORMAL HIGH (ref 6–20)
CO2: 25 mmol/L (ref 22–32)
Calcium: 8.6 mg/dL — ABNORMAL LOW (ref 8.9–10.3)
Chloride: 104 mmol/L (ref 98–111)
Creatinine, Ser: 0.7 mg/dL (ref 0.44–1.00)
GFR, Estimated: >60 mL/min (ref >60)
Glucose, Bld: 252 mg/dL — ABNORMAL HIGH (ref 70–99)
Potassium: 4.1 mmol/L (ref 3.5–5.1)
Sodium: 139 mmol/L (ref 135–145)
Total Bilirubin: 0.4 mg/dL (ref 0.3–1.2)
Total Protein: 5.7 g/dL — ABNORMAL LOW (ref 6.5–8.1)

## 2021-08-02 LAB — GLUCOSE, CAPILLARY: Glucose-Capillary: 238 mg/dL — ABNORMAL HIGH (ref 70–99)

## 2021-08-02 LAB — C-REACTIVE PROTEIN: CRP: 5.2 mg/dL — ABNORMAL HIGH (ref <1.0)

## 2021-08-02 NOTE — Discharge Instructions (Signed)
Follow with Primary MD in 7 days   Get CBC, CMP, 2 view Chest X ray -  checked next visit within 1 week by Primary MD    Activity: As tolerated with Full fall precautions use walker/cane & assistance as needed  Disposition Home   Diet: Heart Healthy Low Carb  Accuchecks 4 times/day, Once in AM empty stomach and then before each meal. Log in all results and show them to your Prim.MD in 3 days. If any glucose reading is under 80 or above 300 call your Prim MD immidiately. Follow Low glucose instructions for glucose under 80 as instructed.   Special Instructions: If you have smoked or chewed Tobacco  in the last 2 yrs please stop smoking, stop any regular Alcohol  and or any Recreational drug use.  On your next visit with your primary care physician please Get Medicines reviewed and adjusted.  Please request your Prim.MD to go over all Hospital Tests and Procedure/Radiological results at the follow up, please get all Hospital records sent to your Prim MD by signing hospital release before you go home.  If you experience worsening of your admission symptoms, develop shortness of breath, life threatening emergency, suicidal or homicidal thoughts you must seek medical attention immediately by calling 911 or calling your MD immediately  if symptoms less severe.  You Must read complete instructions/literature along with all the possible adverse reactions/side effects for all the Medicines you take and that have been prescribed to you. Take any new Medicines after you have completely understood and accpet all the possible adverse reactions/side effects.        Person Under Monitoring Name: Ruth Walters  Location: [place] [address]   Infection Prevention Recommendations for Individuals Confirmed to have, or Being Evaluated for, 2019 Novel Coronavirus (COVID-19) Infection Who Receive Care at Home  Individuals who are confirmed to have, or are being evaluated  for, COVID-19 should follow the prevention steps below until a healthcare provider or local or state health department says they can return to normal activities.  Stay home except to get medical care You should restrict activities outside your home, except for getting medical care. Do not go to work, school, or public areas, and do not use public transportation or taxis.  Call ahead before visiting your doctor Before your medical appointment, call the healthcare provider and tell them that you have, or are being evaluated for, COVID-19 infection. This will help the healthcare provider's office take steps to keep other people from getting infected. Ask your healthcare provider to call the local or state health department.  Monitor your symptoms Seek prompt medical attention if your illness is worsening (e.g., difficulty breathing). Before going to your medical appointment, call the healthcare provider and tell them that you have, or are being evaluated for, COVID-19 infection. Ask your healthcare provider to call the local or state health department.  Wear a facemask You should wear a facemask that covers your nose and mouth when you are in the same room with other people and when you visit a healthcare provider. People who live with or visit you should also wear a facemask while they are in the same room with you.  Separate yourself from other people in your home As much as possible, you should stay in a different room from other people in your home. Also, you should use a separate bathroom, if available.  Avoid sharing household items You should not share dishes, drinking  glasses, cups, eating utensils, towels, bedding, or other items with other people in your home. After using these items, you should wash them thoroughly with soap and water.  Cover your coughs and sneezes Cover your mouth and nose with a tissue when you cough or sneeze, or you can cough or sneeze into your sleeve.  Throw used tissues in a lined trash can, and immediately wash your hands with soap and water for at least 20 seconds or use an alcohol-based hand rub.  Wash your [facility] your hands often and thoroughly with soap and water for at least 20 seconds. You can use an alcohol-based hand sanitizer if soap and water are not available and if your hands are not visibly dirty. Avoid touching your eyes, nose, and mouth with unwashed hands.   Prevention Steps for Caregivers and Household Members of Individuals Confirmed to have, or Being Evaluated for, COVID-19 Infection Being Cared for in the Home  If you live with, or provide care at home for, a person confirmed to have, or being evaluated for, COVID-19 infection please follow these guidelines to prevent infection:  Follow healthcare provider's instructions Make sure that you understand and can help the patient follow any healthcare provider instructions for all care.  Provide for the patient's basic needs You should help the patient with basic needs in the home and provide support for getting groceries, prescriptions, and other personal needs.  Monitor the patient's symptoms If they are getting sicker, call his or her medical provider and tell them that the patient has, or is being evaluated for, COVID-19 infection. This will help the healthcare provider's office take steps to keep other people from getting infected. Ask the healthcare provider to call the local or state health department.  Limit the number of people who have contact with the patient If possible, have only one caregiver for the patient. Other household members should stay in another home or place of residence. If this is not possible, they should stay in another room, or be separated from the patient as much as possible. Use a separate bathroom, if available. Restrict visitors who do not have an essential need to be in the home.  Keep older adults, very young children, and  other sick people away from the patient Keep older adults, very young children, and those who have compromised immune systems or chronic health conditions away from the patient. This includes people with chronic heart, lung, or kidney conditions, diabetes, and cancer.  Ensure good ventilation Make sure that shared spaces in the home have good air flow, such as from an air conditioner or an opened window, weather permitting.  Wash your hands often Wash your hands often and thoroughly with soap and water for at least 20 seconds. You can use an alcohol based hand sanitizer if soap and water are not available and if your hands are not visibly dirty. Avoid touching your eyes, nose, and mouth with unwashed hands. Use disposable paper towels to dry your hands. If not available, use dedicated cloth towels and replace them when they become wet.  Wear a facemask and gloves Wear a disposable facemask at all times in the room and gloves when you touch or have contact with the patient's blood, body fluids, and/or secretions or excretions, such as sweat, saliva, sputum, nasal mucus, vomit, urine, or feces.  Ensure the mask fits over your nose and mouth tightly, and do not touch it during use. Throw out disposable facemasks and gloves after using them.  Do not reuse. Wash your hands immediately after removing your facemask and gloves. If your personal clothing becomes contaminated, carefully remove clothing and launder. Wash your hands after handling contaminated clothing. Place all used disposable facemasks, gloves, and other waste in a lined container before disposing them with other household waste. Remove gloves and wash your hands immediately after handling these items.  Do not share dishes, glasses, or other household items with the patient Avoid sharing household items. You should not share dishes, drinking glasses, cups, eating utensils, towels, bedding, or other items with a patient who is confirmed to  have, or being evaluated for, COVID-19 infection. After the person uses these items, you should wash them thoroughly with soap and water.  Wash laundry thoroughly Immediately remove and wash clothes or bedding that have blood, body fluids, and/or secretions or excretions, such as sweat, saliva, sputum, nasal mucus, vomit, urine, or feces, on them. Wear gloves when handling laundry from the patient. Read and follow directions on labels of laundry or clothing items and detergent. In general, wash and dry with the warmest temperatures recommended on the label.  Clean all areas the individual has used often Clean all touchable surfaces, such as counters, tabletops, doorknobs, bathroom fixtures, toilets, phones, keyboards, tablets, and bedside tables, every day. Also, clean any surfaces that may have blood, body fluids, and/or secretions or excretions on them. Wear gloves when cleaning surfaces the patient has come in contact with. Use a diluted bleach solution (e.g., dilute bleach with 1 part bleach and 10 parts water) or a household disinfectant with a label that says EPA-registered for coronaviruses. To make a bleach solution at home, add 1 tablespoon of bleach to 1 quart (4 cups) of water. For a larger supply, add  cup of bleach to 1 gallon (16 cups) of water. Read labels of cleaning products and follow recommendations provided on product labels. Labels contain instructions for safe and effective use of the cleaning product including precautions you should take when applying the product, such as wearing gloves or eye protection and making sure you have good ventilation during use of the product. Remove gloves and wash hands immediately after cleaning.  Monitor yourself for signs and symptoms of illness Caregivers and household members are considered close contacts, should monitor their health, and will be asked to limit movement outside of the home to the extent possible. Follow the monitoring steps  for close contacts listed on the symptom monitoring form.   ? If you have additional questions, contact your local health department or call the epidemiologist on call at [phone] (available 24/7). ? This guidance is subject to change. For the most up-to-date guidance from [facility], please refer to their website: [email or web address]

## 2021-08-02 NOTE — Progress Notes (Signed)
Patient has been discharged home.  Discharge papers given and explained to patient.  An interpreter utilized.  Patient verbalized understanding.  Meds and f/u appointment reviewed.  No Rx at this time.

## 2021-08-02 NOTE — Discharge Summary (Signed)
Ruth Walters QQV:[id number] DOB: 1966-11-27 DOA: 07/29/2021  PCP: Pcp, No  Admit date: 07/29/2021  Discharge date: 08/02/2021  Admitted From: Home  Disposition:  Home   Recommendations for Outpatient Follow-up:   Follow up with PCP in 1-2 weeks  PCP Please obtain BMP/CBC, 2 view CXR in 1week,  (see Discharge instructions)   PCP Please follow up on the following pending results:    Home Health: None   Equipment/Devices: None  Consultations: None  Discharge Condition: Stable    CODE STATUS: Full    Diet Recommendation: Heart Healthy - Low Carb  Diet Order             Diet Carb Modified Fluid consistency: Thin; Room service appropriate? Yes  Diet effective now                    Chief Complaint  Patient presents with   Dehydration     Brief history of present illness from the day of admission and additional interim summary    Patient is a 55 y.o. female with PMHx of multiple myeloma-getting chemotherapy at [facility], insulin-dependent DM-2, HTN-who presented with cough, nausea, vomiting, diarrhea for the past several days.  She was found to have COVID 19 pneumonia.  See below for further details.   COVID-19 vaccinated status: Vaccinated x1 booster.   Significant Events: 8/10>> Admit to [facility] for COVID-19 related pneumonia.   Significant studies: 8/10>>Chest x-ray: Bilateral patchy infiltrates (personally reviewed)                                                                   Hospital Course   Acute Hypoxic Resp Failure due to Covid 19 Viral pneumonia: Hypoxia has resolved-on room air-CRP was elevated but downtrending-given immunocompromised status- was treated with steroid/Remdesivir short course.  Clinically now at baseline and symptom-free on room air at rest and on ambulation, will be  discharged home on home medications with PCP follow-up in a week.   SpO2: 95 %  Recent Labs  Lab 07/29/21 0846 07/29/21 0943 07/29/21 1451 07/30/21 0628 07/31/21 0626 08/01/21 0644 08/02/21 0513  WBC 10.9*  --  12.4* 8.2 5.0 10.4 9.6  CRP  --   --  21.2* 20.1* 14.7* 8.4*  --   DDIMER  --   --  0.69* 0.56* 0.66* 0.47 0.41  PROCALCITON  --   --  0.11  --   --   --   --   AST 19  --   --  18 16 17 27   ALT 14  --   --  14 15 17  33  ALKPHOS 109  --   --  112 118 111 114  BILITOT 0.6  --   --  0.5 0.5 0.6 0.4  ALBUMIN 3.0*  --   --  2.8* 2.9* 2.8* 2.9*  SARSCOV2NAA  --  POSITIVE*  --   --   --   --   --       Asymptomatic bacteriuria: No UTI symptoms-immunocompromised-urine culture never done-will likely be sterile as she is already on Rocephin.  Treated with Rocephin for 3 days.   HTN: BP stable-resume lisinopril when able.   Insulin-dependent DM-2 with uncontrolled hyperglycemia (A1c 12.9): Currently being discharged on home regimen but will request PCP to monitor and adjust regimen as needed for better glycemic control.  Patient requested to check CBGs q. [facility] S maintain a logbook and show it to PCP in a week for further adjustment.  Lab Results  Component Value Date   HGBA1C 12.9 (H) 07/29/2021      History of multiple myeloma: Claims she is getting chemotherapy every month at [facility].  No acute issues here.  Discharge diagnosis     Active Problems:   Pneumonia due to COVID-19 virus    Discharge instructions    Discharge Instructions     Discharge instructions   Complete by: As directed    Follow with Primary MD in 7 days   Get CBC, CMP, 2 view Chest X ray -  checked next visit within 1 week by Primary MD    Activity: As tolerated with Full fall precautions use walker/cane & assistance as needed  Disposition Home   Diet: Heart Healthy Low Carb  Accuchecks 4 times/day, Once in AM empty stomach and then before each meal. Log in all results and show them to your  Prim.MD in 3 days. If any glucose reading is under 80 or above 300 call your Prim MD immidiately. Follow Low glucose instructions for glucose under 80 as instructed.   Special Instructions: If you have smoked or chewed Tobacco  in the last 2 yrs please stop smoking, stop any regular Alcohol  and or any Recreational drug use.  On your next visit with your primary care physician please Get Medicines reviewed and adjusted.  Please request your Prim.MD to go over all Hospital Tests and Procedure/Radiological results at the follow up, please get all Hospital records sent to your Prim MD by signing hospital release before you go home.  If you experience worsening of your admission symptoms, develop shortness of breath, life threatening emergency, suicidal or homicidal thoughts you must seek medical attention immediately by calling 911 or calling your MD immediately  if symptoms less severe.  You Must read complete instructions/literature along with all the possible adverse reactions/side effects for all the Medicines you take and that have been prescribed to you. Take any new Medicines after you have completely understood and accpet all the possible adverse reactions/side effects.   Increase activity slowly   Complete by: As directed    MyChart COVID-19 home monitoring program   Complete by: Aug 02, 2021    Is the patient willing to use the [facility] for home monitoring?: Yes   Temperature monitoring   Complete by: Aug 02, 2021    After how many days would you like to receive a notification of this patient's flowsheet entries?: 1       Discharge Medications   Allergies as of 08/02/2021   No Known Allergies      Medication List     STOP taking these medications    gabapentin 300 MG capsule Commonly known as: NEURONTIN   glipiZIDE 10 MG tablet Commonly known as: GLUCOTROL   metFORMIN 500 MG 24  hr tablet Commonly known as: GLUCOPHAGE-XR   pantoprazole 20 MG  tablet Commonly known as: PROTONIX       TAKE these medications    aspirin EC 81 MG tablet Take 81 mg by mouth daily. Swallow whole.   brimonidine 0.2 % ophthalmic solution Commonly known as: ALPHAGAN Place 1 drop into the right eye 3 (three) times daily.   insulin aspart 100 UNIT/ML injection Commonly known as: novoLOG Inject 5 Units into the skin 3 (three) times daily before meals.   insulin glargine 100 UNIT/ML injection Commonly known as: LANTUS Inject 48 Units into the skin at bedtime.   lisinopril 5 MG tablet Commonly known as: ZESTRIL Take 2.5 mg by mouth daily.       ASK your doctor about these medications    valACYclovir 500 MG tablet Commonly known as: VALTREX Take 500 mg by mouth daily.         Follow-up Information     Your PCP. Schedule an appointment as soon as possible for a visit in 1 week(s).                  Major procedures and Radiology Reports - PLEASE review detailed and final reports thoroughly  -       DG Chest 2 View  Result Date: 07/29/2021 CLINICAL DATA:  55 year old female with dehydration, nausea vomiting, abdominal pain. EXAM: CHEST - 2 VIEW COMPARISON:  Chest CTA 11/23/2019 and earlier. FINDINGS: PA and lateral views. Lower lung volumes compared to 2020. Patchy, peripheral and indistinct pulmonary opacities are asymmetric and more extensive in the left lung. No pneumothorax, pulmonary edema or pleural effusion. Mediastinal contours remain normal. Visualized tracheal air column is within normal limits. No acute osseous abnormality identified. Negative visible bowel gas pattern. IMPRESSION: Aymmetric patchy and indistinct pulmonary opacities, suspicious for acute viral/atypical respiratory infection. No pleural effusion. Electronically Signed   By: Genevie Ann M.D.   On: 07/29/2021 09:54    Micro Results     Recent Results (from the past 240 hour(s))  Resp Panel by RT-PCR (Flu A&B, Covid)     Status: Abnormal   Collection Time:  07/29/21  9:43 AM   Specimen: Nasopharyngeal(NP) swabs in vial transport medium  Result Value Ref Range Status   SARS Coronavirus 2 by RT PCR POSITIVE (A) NEGATIVE Final    Comment: RESULT CALLED TO, READ BACK BY AND VERIFIED WITH: KELLY PENDLETON 8/10 @ 3614 BY S. BEARD (NOTE) SARS-CoV-2 target nucleic acids are DETECTED.  The SARS-CoV-2 RNA is generally detectable in upper respiratory specimens during the acute phase of infection. Positive results are indicative of the presence of the identified virus, but do not rule out bacterial infection or co-infection with other pathogens not detected by the test. Clinical correlation with patient history and other diagnostic information is necessary to determine patient infection status. The expected result is Negative.  Fact Sheet for Patients: EntrepreneurPulse.com.au  Fact Sheet for Healthcare Providers: IncredibleEmployment.be  This test is not yet approved or cleared by the [place] FDA and  has been authorized for detection and/or diagnosis of SARS-CoV-2 by FDA under an Emergency Use Authorization (EUA).  This EUA will remain in effect (meaning this test  can be used) for the duration of  the COVID-19 declaration under Section 564(b)(1) of the Act, 21 U.S.C. section 360bbb-3(b)(1), unless the authorization is terminated or revoked sooner.     Influenza A by PCR NEGATIVE NEGATIVE Final   Influenza B by PCR NEGATIVE NEGATIVE Final  Comment: (NOTE) The Xpert Xpress SARS-CoV-2/FLU/RSV plus assay is intended as an aid in the diagnosis of influenza from Nasopharyngeal swab specimens and should not be used as a sole basis for treatment. Nasal washings and aspirates are unacceptable for Xpert Xpress SARS-CoV-2/FLU/RSV testing.  Fact Sheet for Patients: EntrepreneurPulse.com.au  Fact Sheet for Healthcare Providers: IncredibleEmployment.be  This test is  not yet approved or cleared by the [place] FDA and has been authorized for detection and/or diagnosis of SARS-CoV-2 by FDA under an Emergency Use Authorization (EUA). This EUA will remain in effect (meaning this test can be used) for the duration of the COVID-19 declaration under Section 564(b)(1) of the Act, 21 U.S.C. section 360bbb-3(b)(1), unless the authorization is terminated or revoked.  Performed at [facility], [address] [place], [place] [address]   Culture, blood (Routine X 2) w Reflex to ID Panel     Status: None (Preliminary result)   Collection Time: 07/29/21  2:50 PM   Specimen: BLOOD  Result Value Ref Range Status   Specimen Description BLOOD BLOOD RIGHT HAND  Final   Special Requests   Final    BOTTLES DRAWN AEROBIC AND ANAEROBIC Blood Culture adequate volume   Culture   Final    NO GROWTH 4 DAYS Performed at [facility], [address] [place], [place] [address]    Report Status PENDING  Incomplete  Culture, blood (Routine X 2) w Reflex to ID Panel     Status: None (Preliminary result)   Collection Time: 07/29/21  3:02 PM   Specimen: BLOOD  Result Value Ref Range Status   Specimen Description BLOOD LEFT ANTECUBITAL  Final   Special Requests   Final    BOTTLES DRAWN AEROBIC AND ANAEROBIC Blood Culture results may not be optimal due to an excessive volume of blood received in culture bottles   Culture   Final    NO GROWTH 4 DAYS Performed at [facility], [address], [place], [place] [address]    Report Status PENDING  Incomplete    Today   Subjective    Ruth Walters today has no headache,no chest abdominal pain,no new weakness tingling or numbness, feels much better wants to go home today.     Objective   Blood pressure (!) 153/76, pulse 75, temperature 98 F (36.7 C), temperature source Oral, resp. rate 16, height 4' 11"  (1.499 m), weight 63.5 kg, SpO2 95 %.   Intake/Output Summary (Last 24 hours) at  08/02/2021 0844 Last data filed at 08/01/2021 2125 Gross per 24 hour  Intake 963 ml  Output --  Net 963 ml    Exam  Awake Alert, No new F.N deficits, Normal affect [place].AT,PERRAL Supple Neck,No JVD, No cervical lymphadenopathy appriciated.  Symmetrical Chest wall movement, Good air movement bilaterally, CTAB RRR,No Gallops,Rubs or new Murmurs, No Parasternal Heave +ve B.Sounds, Abd Soft, Non tender, No organomegaly appriciated, No rebound -guarding or rigidity. No Cyanosis, Clubbing or edema, No new Rash or bruise   Data Review   CBC w Diff:  Lab Results  Component Value Date   WBC 9.6 08/02/2021   HGB 12.5 08/02/2021   HGB 12.9 07/31/2013   HCT 35.9 (L) 08/02/2021   HCT 36.5 07/31/2013   PLT 405 (H) 08/02/2021   PLT 437 07/31/2013   LYMPHOPCT 9 08/02/2021   MONOPCT 4 08/02/2021   EOSPCT 0 08/02/2021   BASOPCT 0 08/02/2021    CMP:  Lab Results  Component Value Date   NA  139 08/02/2021   NA 136 07/31/2013   K 4.1 08/02/2021   K 4.0 07/31/2013   CL 104 08/02/2021   CL 101 07/31/2013   CO2 25 08/02/2021   CO2 30 07/31/2013   BUN 33 (H) 08/02/2021   BUN 18 07/31/2013   CREATININE 0.70 08/02/2021   CREATININE 0.28 (L) 07/31/2013   PROT 5.7 (L) 08/02/2021   PROT 7.6 07/31/2013   ALBUMIN 2.9 (L) 08/02/2021   ALBUMIN 3.0 (L) 07/31/2013   BILITOT 0.4 08/02/2021   BILITOT 0.4 07/31/2013   ALKPHOS 114 08/02/2021   ALKPHOS 159 (H) 07/31/2013   AST 27 08/02/2021   AST 12 (L) 07/31/2013   ALT 33 08/02/2021   ALT 18 07/31/2013  .   Total Time in preparing paper work, data evaluation and todays exam - 58 minutes  Lala Lund M.D on 08/02/2021 at 8:44 AM  [facility]

## 2021-08-03 LAB — CULTURE, BLOOD (ROUTINE X 2)
Culture: NO GROWTH
Culture: NO GROWTH
Special Requests: ADEQUATE

## 2023-12-20 ENCOUNTER — Emergency Department: Payer: Self-pay

## 2023-12-20 ENCOUNTER — Encounter: Payer: Self-pay | Admitting: Emergency Medicine

## 2023-12-20 ENCOUNTER — Inpatient Hospital Stay
Admission: EM | Admit: 2023-12-20 | Discharge: 2024-01-21 | DRG: 082 | Disposition: E | Payer: Self-pay | Attending: Student in an Organized Health Care Education/Training Program | Admitting: Student in an Organized Health Care Education/Training Program

## 2023-12-20 ENCOUNTER — Other Ambulatory Visit: Payer: Self-pay

## 2023-12-20 DIAGNOSIS — J810 Acute pulmonary edema: Principal | ICD-10-CM

## 2023-12-20 DIAGNOSIS — I10 Essential (primary) hypertension: Secondary | ICD-10-CM

## 2023-12-20 DIAGNOSIS — Z83438 Family history of other disorder of lipoprotein metabolism and other lipidemia: Secondary | ICD-10-CM

## 2023-12-20 DIAGNOSIS — R197 Diarrhea, unspecified: Secondary | ICD-10-CM | POA: Diagnosis present

## 2023-12-20 DIAGNOSIS — E11649 Type 2 diabetes mellitus with hypoglycemia without coma: Secondary | ICD-10-CM | POA: Diagnosis not present

## 2023-12-20 DIAGNOSIS — Z806 Family history of leukemia: Secondary | ICD-10-CM

## 2023-12-20 DIAGNOSIS — C9000 Multiple myeloma not having achieved remission: Secondary | ICD-10-CM | POA: Diagnosis present

## 2023-12-20 DIAGNOSIS — R5381 Other malaise: Secondary | ICD-10-CM

## 2023-12-20 DIAGNOSIS — Z79899 Other long term (current) drug therapy: Secondary | ICD-10-CM

## 2023-12-20 DIAGNOSIS — Z794 Long term (current) use of insulin: Secondary | ICD-10-CM

## 2023-12-20 DIAGNOSIS — Y92002 Bathroom of unspecified non-institutional (private) residence single-family (private) house as the place of occurrence of the external cause: Secondary | ICD-10-CM

## 2023-12-20 DIAGNOSIS — Z1152 Encounter for screening for COVID-19: Secondary | ICD-10-CM

## 2023-12-20 DIAGNOSIS — I468 Cardiac arrest due to other underlying condition: Secondary | ICD-10-CM | POA: Diagnosis present

## 2023-12-20 DIAGNOSIS — Z7982 Long term (current) use of aspirin: Secondary | ICD-10-CM

## 2023-12-20 DIAGNOSIS — D631 Anemia in chronic kidney disease: Secondary | ICD-10-CM | POA: Diagnosis present

## 2023-12-20 DIAGNOSIS — E119 Type 2 diabetes mellitus without complications: Secondary | ICD-10-CM

## 2023-12-20 DIAGNOSIS — I120 Hypertensive chronic kidney disease with stage 5 chronic kidney disease or end stage renal disease: Secondary | ICD-10-CM | POA: Diagnosis present

## 2023-12-20 DIAGNOSIS — B348 Other viral infections of unspecified site: Secondary | ICD-10-CM | POA: Diagnosis present

## 2023-12-20 DIAGNOSIS — R402142 Coma scale, eyes open, spontaneous, at arrival to emergency department: Secondary | ICD-10-CM | POA: Diagnosis present

## 2023-12-20 DIAGNOSIS — I959 Hypotension, unspecified: Secondary | ICD-10-CM | POA: Diagnosis not present

## 2023-12-20 DIAGNOSIS — B9789 Other viral agents as the cause of diseases classified elsewhere: Secondary | ICD-10-CM | POA: Diagnosis present

## 2023-12-20 DIAGNOSIS — R5383 Other fatigue: Secondary | ICD-10-CM

## 2023-12-20 DIAGNOSIS — Z603 Acculturation difficulty: Secondary | ICD-10-CM | POA: Diagnosis present

## 2023-12-20 DIAGNOSIS — N186 End stage renal disease: Secondary | ICD-10-CM

## 2023-12-20 DIAGNOSIS — S065XAA Traumatic subdural hemorrhage with loss of consciousness status unknown, initial encounter: Principal | ICD-10-CM

## 2023-12-20 DIAGNOSIS — J9601 Acute respiratory failure with hypoxia: Secondary | ICD-10-CM | POA: Diagnosis present

## 2023-12-20 DIAGNOSIS — Z992 Dependence on renal dialysis: Secondary | ICD-10-CM

## 2023-12-20 DIAGNOSIS — W19XXXA Unspecified fall, initial encounter: Secondary | ICD-10-CM

## 2023-12-20 DIAGNOSIS — W010XXA Fall on same level from slipping, tripping and stumbling without subsequent striking against object, initial encounter: Secondary | ICD-10-CM | POA: Diagnosis present

## 2023-12-20 DIAGNOSIS — Y92009 Unspecified place in unspecified non-institutional (private) residence as the place of occurrence of the external cause: Secondary | ICD-10-CM

## 2023-12-20 DIAGNOSIS — Z5941 Food insecurity: Secondary | ICD-10-CM

## 2023-12-20 DIAGNOSIS — R402362 Coma scale, best motor response, obeys commands, at arrival to emergency department: Secondary | ICD-10-CM | POA: Diagnosis present

## 2023-12-20 DIAGNOSIS — E1122 Type 2 diabetes mellitus with diabetic chronic kidney disease: Secondary | ICD-10-CM | POA: Diagnosis present

## 2023-12-20 DIAGNOSIS — R402252 Coma scale, best verbal response, oriented, at arrival to emergency department: Secondary | ICD-10-CM | POA: Diagnosis present

## 2023-12-20 DIAGNOSIS — E8770 Fluid overload, unspecified: Secondary | ICD-10-CM | POA: Diagnosis present

## 2023-12-20 DIAGNOSIS — N2581 Secondary hyperparathyroidism of renal origin: Secondary | ICD-10-CM | POA: Diagnosis present

## 2023-12-20 DIAGNOSIS — Z5982 Transportation insecurity: Secondary | ICD-10-CM

## 2023-12-20 LAB — BASIC METABOLIC PANEL
Anion gap: 18 — ABNORMAL HIGH (ref 5–15)
BUN: 57 mg/dL — ABNORMAL HIGH (ref 6–20)
CO2: 23 mmol/L (ref 22–32)
Calcium: 7.8 mg/dL — ABNORMAL LOW (ref 8.9–10.3)
Chloride: 95 mmol/L — ABNORMAL LOW (ref 98–111)
Creatinine, Ser: 5.66 mg/dL — ABNORMAL HIGH (ref 0.44–1.00)
GFR, Estimated: 8 mL/min — ABNORMAL LOW (ref >60)
Glucose, Bld: 106 mg/dL — ABNORMAL HIGH (ref 70–99)
Potassium: 3.6 mmol/L (ref 3.5–5.1)
Sodium: 136 mmol/L (ref 135–145)

## 2023-12-20 LAB — CBC WITH DIFFERENTIAL/PLATELET
Abs Immature Granulocytes: 0.05 10*3/uL (ref 0.00–0.07)
Basophils Absolute: 0.1 10*3/uL (ref 0.0–0.1)
Basophils Relative: 1 %
Eosinophils Absolute: 0.1 10*3/uL (ref 0.0–0.5)
Eosinophils Relative: 0 %
HCT: 30.3 % — ABNORMAL LOW (ref 36.0–46.0)
Hemoglobin: 10 g/dL — ABNORMAL LOW (ref 12.0–15.0)
Immature Granulocytes: 0 %
Lymphocytes Relative: 2 %
Lymphs Abs: 0.2 10*3/uL — ABNORMAL LOW (ref 0.7–4.0)
MCH: 31.3 pg (ref 26.0–34.0)
MCHC: 33 g/dL (ref 30.0–36.0)
MCV: 95 fL (ref 80.0–100.0)
Monocytes Absolute: 0.3 10*3/uL (ref 0.1–1.0)
Monocytes Relative: 2 %
Neutro Abs: 13.7 10*3/uL — ABNORMAL HIGH (ref 1.7–7.7)
Neutrophils Relative %: 95 %
Platelets: 314 10*3/uL (ref 150–400)
RBC: 3.19 MIL/uL — ABNORMAL LOW (ref 3.87–5.11)
RDW: 18.1 % — ABNORMAL HIGH (ref 11.5–15.5)
Smear Review: NORMAL
WBC: 14.4 10*3/uL — ABNORMAL HIGH (ref 4.0–10.5)
nRBC: 0 % (ref 0.0–0.2)

## 2023-12-20 LAB — RESP PANEL BY RT-PCR (RSV, FLU A&B, COVID)  RVPGX2
Influenza A by PCR: NEGATIVE
Influenza B by PCR: NEGATIVE
Resp Syncytial Virus by PCR: NEGATIVE
SARS Coronavirus 2 by RT PCR: NEGATIVE

## 2023-12-20 LAB — BRAIN NATRIURETIC PEPTIDE: B Natriuretic Peptide: 328.2 pg/mL — ABNORMAL HIGH (ref 0.0–100.0)

## 2023-12-20 LAB — CBG MONITORING, ED: Glucose-Capillary: 85 mg/dL (ref 70–99)

## 2023-12-20 MED ORDER — ACETAMINOPHEN 650 MG RE SUPP: 650.0000 mg | Freq: Four times a day (QID) | RECTAL | Status: DC | PRN

## 2023-12-20 MED ORDER — ASPIRIN 81 MG PO TBEC
81.0000 mg | DELAYED_RELEASE_TABLET | Freq: Every day | ORAL | Status: DC
  Filled 2023-12-20: qty 1

## 2023-12-20 MED ORDER — INSULIN ASPART 100 UNIT/ML IJ SOLN: 0.0000 [IU] | Freq: Three times a day (TID) | INTRAMUSCULAR | Status: DC

## 2023-12-20 MED ORDER — BRIMONIDINE TARTRATE 0.2 % OP SOLN
1.0000 [drp] | Freq: Three times a day (TID) | OPHTHALMIC | Status: DC
  Administered 2023-12-21: 1 [drp] via OPHTHALMIC
  Filled 2023-12-20 (×2): qty 5

## 2023-12-20 MED ORDER — HEPARIN SODIUM (PORCINE) 5000 UNIT/ML IJ SOLN
5000.0000 [IU] | Freq: Three times a day (TID) | INTRAMUSCULAR | Status: DC
  Administered 2023-12-20 – 2023-12-21 (×3): 5000 [IU] via SUBCUTANEOUS
  Filled 2023-12-20 (×3): qty 1

## 2023-12-20 MED ORDER — TRAMADOL HCL 50 MG PO TABS
50.0000 mg | ORAL_TABLET | Freq: Four times a day (QID) | ORAL | Status: DC | PRN
  Administered 2023-12-20 – 2023-12-21 (×2): 50 mg via ORAL
  Filled 2023-12-20 (×2): qty 1

## 2023-12-20 MED ORDER — ADULT MULTIVITAMIN W/MINERALS CH
1.0000 | ORAL_TABLET | Freq: Every day | ORAL | Status: DC
  Administered 2023-12-20: 1 via ORAL
  Filled 2023-12-20 (×2): qty 1

## 2023-12-20 MED ORDER — ACETAMINOPHEN 325 MG PO TABS
650.0000 mg | ORAL_TABLET | Freq: Four times a day (QID) | ORAL | Status: DC | PRN
  Administered 2023-12-21: 650 mg via ORAL
  Filled 2023-12-20: qty 2

## 2023-12-20 MED ORDER — DM-GUAIFENESIN ER 30-600 MG PO TB12
1.0000 | ORAL_TABLET | Freq: Two times a day (BID) | ORAL | Status: DC
Start: 2023-12-20 — End: 2023-12-22
  Administered 2023-12-20 – 2023-12-21 (×2): 1 via ORAL
  Filled 2023-12-20 (×3): qty 1

## 2023-12-20 MED ORDER — ONDANSETRON HCL 4 MG/2ML IJ SOLN: 4.0000 mg | Freq: Four times a day (QID) | INTRAMUSCULAR | Status: DC | PRN

## 2023-12-20 MED ORDER — ACETAMINOPHEN 325 MG PO TABS
650.0000 mg | ORAL_TABLET | Freq: Once | ORAL | Status: AC
  Administered 2023-12-20: 650 mg via ORAL
  Filled 2023-12-20: qty 2

## 2023-12-20 MED ORDER — INSULIN ASPART 100 UNIT/ML IJ SOLN: 0.0000 [IU] | Freq: Every day | INTRAMUSCULAR | Status: DC

## 2023-12-20 MED ORDER — CHLORHEXIDINE GLUCONATE CLOTH 2 % EX PADS
6.0000 | MEDICATED_PAD | Freq: Every day | CUTANEOUS | Status: DC
  Administered 2023-12-21: 6 via TOPICAL
  Filled 2023-12-20: qty 6

## 2023-12-20 MED ORDER — ONDANSETRON HCL 4 MG PO TABS: 4.0000 mg | ORAL_TABLET | Freq: Four times a day (QID) | ORAL | Status: DC | PRN

## 2023-12-20 NOTE — Assessment & Plan Note (Addendum)
Patient was having generalized aches and pain, malaise and fatigue for the past 3 days.  Poor appetite and some diarrhea, likely some viral infection.  COVID, influenza and RSV negative  Checking respiratory viral panel. -Supportive care

## 2023-12-20 NOTE — Assessment & Plan Note (Signed)
 Blood pressure currently soft. On low-dose lisinopril at home -Holding home lisinopril

## 2023-12-20 NOTE — ED Triage Notes (Signed)
 Pt via [facility] from home. Pt has multiple complaints. Pt fell in the bathroom on Saturday. Denies any LOC. But reports that she did hit her head and she does have a headache. Denies blood thinners. Also reports back pain, chest pain Pt is dialysis pt and pt did miss her treatment today. Pt is A&OX4 and NAD

## 2023-12-20 NOTE — Assessment & Plan Note (Signed)
 Patient is undergoing chemotherapy, last session was on 12/05/2023 She missed 1 session afterwards yesterday as she was not feeling well. -Outpatient follow-up

## 2023-12-20 NOTE — Assessment & Plan Note (Signed)
 Patient currently needing up to 2 L of oxygen, no baseline oxygen use.  Chest x-ray with some pulmonary edema.  Patient missed today's dialysis.  Also having upper respiratory symptoms with cough and generalized malaise. Check respiratory viral panel and BNP. -Admit to medical telemetry -Can try 1 dose of Lasix if blood pressure permits as still making some urine. -Will be taken for dialysis either later tonight or tomorrow morning -Continue supplemental oxygen-wean as tolerated

## 2023-12-20 NOTE — ED Notes (Signed)
Pt placed on 2L [place], sats were varied between 80-90s- unsure if reading was correct. Sats now @95 % on 2L [place]. Pt is no distress, unlabored breathing

## 2023-12-20 NOTE — Assessment & Plan Note (Signed)
 Patient was recently started on dialysis in October 2024, TTS schedule.  She missed dialysis today as was not feeling well. -Nephrology was consulted

## 2023-12-20 NOTE — ED Provider Notes (Signed)
 [facility] Provider Note    Event Date/Time   First MD Initiated Contact with Patient 12/20/23 1512     (approximate)   History   Fall  Spanish interpreter used HPI  Efrat E Laroche is a 57 y.o. female with a history of diabetes, end-stage renal disease on dialysis, just received dialysis today who presents after a fall on Saturday, 3 days ago.  Patient reports she slipped, fell backwards and hit her head.  She has had a headache since then.  No neurodeficits reported.  No nausea or vomiting.  Also complains of some back discomfort     Physical Exam   Triage Vital Signs: ED Triage Vitals  Encounter Vitals Group     BP 12/20/23 1353 (!) 122/95     Systolic BP Percentile --      Diastolic BP Percentile --      Pulse Rate 12/20/23 1349 70     Resp 12/20/23 1353 20     Temp 12/20/23 1349 98.6 F (37 C)     Temp Source 12/20/23 1349 Oral     SpO2 12/20/23 1353 94 %     Weight --      Height 12/20/23 1348 1.499 m (4' 11)     Head Circumference --      Peak Flow --      Pain Score 12/20/23 1348 8     Pain Loc --      Pain Education --      Exclude from Growth Chart --     Most recent vital signs: Vitals:   12/20/23 1353 12/20/23 1530  BP: (!) 122/95 (!) 102/49  Pulse:  74  Resp: 20 20  Temp:    SpO2: 94% 92%     General: Awake, no distress.  CV:  Good peripheral perfusion.  Right-sided chest dialysis catheter CDI Resp:  Normal effort.  Abd:  No distention.  Soft, nontender Other:  Neurologic exam is normal, neuro intact.   ED Results / Procedures / Treatments   Labs (all labs ordered are listed, but only abnormal results are displayed) Labs Reviewed  BASIC METABOLIC PANEL - Abnormal; Notable for the following components:      Result Value   Chloride 95 (*)    Glucose, Bld 106 (*)    BUN 57 (*)    Creatinine, Ser 5.66 (*)    Calcium 7.8 (*)    GFR, Estimated 8 (*)    Anion gap 18 (*)    All other components within normal  limits  CBC WITH DIFFERENTIAL/PLATELET - Abnormal; Notable for the following components:   WBC 14.4 (*)    RBC 3.19 (*)    Hemoglobin 10.0 (*)    HCT 30.3 (*)    RDW 18.1 (*)    Neutro Abs 13.7 (*)    Lymphs Abs 0.2 (*)    All other components within normal limits  RESP PANEL BY RT-PCR (RSV, FLU A&B, COVID)  [id number]     EKG  ED ECG REPORT I, Lamar Price, the attending physician, personally viewed and interpreted this ECG.  Date: 12/20/2023  Rhythm: normal sinus rhythm QRS Axis: normal Intervals: normal ST/T Wave abnormalities: normal Narrative Interpretation: no evidence of acute ischemia    RADIOLOGY Radiology notes 5 mm subdural hematoma    PROCEDURES:  Critical Care performed: yes  CRITICAL CARE Performed by: Lamar Price   Total critical care time: 30 minutes  Critical care time was exclusive of separately  billable procedures and treating other patients.  Critical care was necessary to treat or prevent imminent or life-threatening deterioration.  Critical care was time spent personally by me on the following activities: development of treatment plan with patient and/or surrogate as well as nursing, discussions with consultants, evaluation of patient's response to treatment, examination of patient, obtaining history from patient or surrogate, ordering and performing treatments and interventions, ordering and review of laboratory studies, ordering and review of radiographic studies, pulse oximetry and re-evaluation of patient's condition.   Procedures   MEDICATIONS ORDERED IN ED: Medications  acetaminophen  (TYLENOL ) tablet 650 mg (650 mg Oral Given 12/20/23 1616)     IMPRESSION / MDM / ASSESSMENT AND PLAN / ED COURSE  I reviewed the triage vital signs and the nursing notes. Patient's presentation is most consistent with acute presentation with potential threat to life or bodily function.  Patient presents for fall with headache as described above,  she is on dialysis and received dialysis today.  No neurodeficits, sent for CT head, notified by radiology of 5 mm subdural hematoma  Patient noted to be hypoxic, started on 2 L nasal cannula  This is likely secondary to the trauma 3 days ago.  Discussed with Dr. Katrina of neurosurgery who notes no treatment or further imaging necessary nor outpatient follow-up unless change in status  Lab work reviewed, mild elevation white blood cell count is nonspecific  Chest x-ray demonstrates pulmonary edema, had dialysis today, discussed with Dr. Marcelino of nephrology who will see the patient  Have consulted the hospitalist for admission        FINAL CLINICAL IMPRESSION(S) / ED DIAGNOSES   Final diagnoses:  Acute pulmonary edema (HCC)  Subdural hematoma (HCC)     Rx / DC Orders   ED Discharge Orders     None        Note:  This document was prepared using Dragon voice recognition software and may include unintentional dictation errors.   Arlander Charleston, MD 12/20/23 [id number]

## 2023-12-20 NOTE — ED Provider Triage Note (Signed)
 Emergency Medicine Provider Triage Evaluation Note  Ruth Walters , a 57 y.o. female  was evaluated in triage.  Pt complains of headache, back pain.  Reports that she had a mechanical fall in the bathroom 2 days ago but felt fine immediately afterwards.  Reports that today she had pain in her back, head, neck, chest and missed dialysis because she came here today.  Also reports that she has multiple myeloma.  Review of Systems  Positive: pain Negative: fever  Physical Exam  BP (!) 122/95   Pulse 70   Temp 98.6 F (37 C) (Oral)   Resp 20   Ht 4' 11 (1.499 m)   SpO2 94%   BMI 28.27 kg/m  Gen:   Awake, no distress   Resp:  Normal effort  MSK:   Moves extremities without difficulty  Other:    Medical Decision Making  Medically screening exam initiated at 2:03 PM.  Appropriate orders placed.  AALIA GREULICH was informed that the remainder of the evaluation will be completed by another provider, this initial triage assessment does not replace that evaluation, and the importance of remaining in the ED until their evaluation is complete.     Asaph Serena E, PA-C 12/20/23 1410

## 2023-12-20 NOTE — H&P (Signed)
 History and Physical    Patient: Ruth Walters FMW:[id number] DOB: [birth year] DOA: 12/20/2023 DOS: the patient was seen and examined on 12/20/2023 PCP: Pcp, No  Patient coming from: Home  Chief Complaint:  Chief Complaint  Patient presents with   Fall   HPI: Ruth Walters is a 57 y.o. female with medical history significant of ESRD on TTS schedule, multiple myeloma undergoing treatment, hypertension and diabetes mellitus came to ED with complaint of generalized malaise, mild nausea and diarrhea for the past 3 days.  Patient had a fall on Saturday and since then she was having mild headache, generalized aches and pains, worsening cough with poor appetite and p.o. intake.  She also experiencing some nausea but no vomiting.  She had some intermittent diarrhea for the past couple of days.  Having mild chills but no fever.  No abdominal pain.  No urinary symptoms.  Patient missed her dialysis today as she was not feeling well.  She called her dialysis center and they advised her to come to ED for evaluation.  Patient is a Spanish speaking lady-a Spanish interpreter was used for communication.  ED course and data reviewed.  Hemodynamically stable with borderline blood pressure, desaturating in mid 80s requiring up to 2 L of oxygen.  Labs pertinent for leukocytosis at 14.4, hemoglobin 10 which is around her baseline, BUN 57, creatinine 5.66.  COVID-19, influenza and RSV negative Chest x-ray concerning for pulmonary edema CT head and cervical spine with a small volume acute subdural hematoma along the anterior left falx.  No mass effect.  No other evidence of any acute injury.  Did show scattered lucent foci throughout the bones, compatible with known history of multiple myeloma. CT lumbar spine with no acute injury.  UA and respiratory viral panel ordered. Nephrology was consulted for dialysis  Review of Systems: As mentioned in the history of present illness. All other systems  reviewed and are negative. Past Medical History:  Diagnosis Date   Cancer (HCC)    Multiple Myleoma   Diabetes mellitus without complication (HCC)    Hypertension    Low blood pressure    Past Surgical History:  Procedure Laterality Date   ABDOMINAL HYSTERECTOMY     Social History:  reports that she has never smoked. She has never used smokeless tobacco. She reports that she does not drink alcohol and does not use drugs.  No Known Allergies  Family History  Problem Relation Age of Onset   Leukemia Sister    Hyperlipidemia Mother     Prior to Admission medications   Medication Sig Start Date End Date Taking? Authorizing Provider  aspirin  EC 81 MG tablet Take 81 mg by mouth daily. Swallow whole.    [provider]  brimonidine  (ALPHAGAN ) 0.2 % ophthalmic solution Place 1 drop into the right eye 3 (three) times daily.    [provider]  insulin  aspart (NOVOLOG ) 100 UNIT/ML injection Inject 5 Units into the skin 3 (three) times daily before meals.    [provider]  insulin  glargine (LANTUS) 100 UNIT/ML injection Inject 48 Units into the skin at bedtime.    [provider]  lisinopril (ZESTRIL) 5 MG tablet Take 2.5 mg by mouth daily.    [provider]  valACYclovir (VALTREX) 500 MG tablet Take 500 mg by mouth daily. Patient not taking: Reported on 07/29/2021    [provider]    Physical Exam: Vitals:   12/20/23 1348 12/20/23 1349 12/20/23 1353 12/20/23 1530  BP:   (!) 122/95 (!) 102/49  Pulse:  70  74  Resp:   20 20  Temp:  98.6 F (37 C)    TempSrc:  Oral    SpO2:   94% 92%  Height: 4' 11 (1.499 m)       General: Vital signs reviewed.  Patient is well-developed and well-nourished, in no acute distress and cooperative with exam.  Head: Normocephalic and atraumatic. Eyes: EOMI, conjunctivae normal, no scleral icterus.  Neck: Supple, trachea midline, normal ROM,  Cardiovascular: RRR, S1 normal, S2 normal, no  murmurs, gallops, or rubs. Pulmonary/Chest: Some scattered rhonchi Abdominal: Soft, non-tender, non-distended, BS +,  Extremities: No lower extremity edema bilaterally,  pulses symmetric and intact bilaterally. No cyanosis or clubbing. Neurological: A&O x3, Strength is normal and symmetric bilaterally, cranial nerve II-XII are grossly intact, no focal motor deficit, sensory intact to light touch bilaterally.  Psychiatric: Normal mood and affect.   Data Reviewed: Prior data reviewed as mentioned above  Assessment and Plan: * Acute hypoxic respiratory failure (HCC) Patient currently needing up to 2 L of oxygen, no baseline oxygen use.  Chest x-ray with some pulmonary edema.  Patient missed today's dialysis.  Also having upper respiratory symptoms with cough and generalized malaise. Check respiratory viral panel and BNP. -Admit to medical telemetry -Can try 1 dose of Lasix if blood pressure permits as still making some urine. -Will be taken for dialysis either later tonight or tomorrow morning -Continue supplemental oxygen-wean as tolerated  Malaise and fatigue Patient was having generalized aches and pain, malaise and fatigue for the past 3 days.  Poor appetite and some diarrhea, likely some viral infection.  COVID, influenza and RSV negative  Checking respiratory viral panel. -Supportive care  Subdural hematoma (HCC) Likely secondary to fall on Saturday.  His small stable subdural hematoma noted on CT head.  EDP discussed with neurosurgery and there was no follow-up needed at this time.  Fall at home, initial encounter -PT/OT evaluation  ESRD on dialysis [facility]) Patient was recently started on dialysis in October 2024, TTS schedule.  She missed dialysis today as was not feeling well. -Nephrology was consulted  Diabetes mellitus without complication (HCC) Check A1c -SSI  Hypertension Blood pressure currently soft. On low-dose lisinopril at home -Holding home  lisinopril  Multiple myeloma [facility]) Patient is undergoing chemotherapy, last session was on 12/05/2023 She missed 1 session afterwards yesterday as she was not feeling well. -Outpatient follow-up  Advance Care Planning:   Code Status: Full Code   Consults: Nephrology  Family Communication: Discussed with patient  Severity of Illness: The appropriate patient status for this patient is OBSERVATION. Observation status is judged to be reasonable and necessary in order to provide the required intensity of service to ensure the patient's safety. The patient's presenting symptoms, physical exam findings, and initial radiographic and laboratory data in the context of their medical condition is felt to place them at decreased risk for further clinical deterioration. Furthermore, it is anticipated that the patient will be medically stable for discharge from the hospital within 2 midnights of admission.   This record has been created using Conservation officer, historic buildings. Errors have been sought and corrected,but may not always be located. Such creation errors do not reflect on the standard of care.   Author: Amaryllis Dare, MD 12/20/2023 5:09 PM  For on call review [email or web address].

## 2023-12-20 NOTE — Assessment & Plan Note (Signed)
-

## 2023-12-20 NOTE — Assessment & Plan Note (Signed)
Check A1c -SSI 

## 2023-12-20 NOTE — Assessment & Plan Note (Signed)
 Likely secondary to fall on Saturday.  His small stable subdural hematoma noted on CT head.  EDP discussed with neurosurgery and there was no follow-up needed at this time.

## 2023-12-21 DIAGNOSIS — B348 Other viral infections of unspecified site: Secondary | ICD-10-CM

## 2023-12-21 LAB — RESPIRATORY PANEL BY PCR

## 2023-12-21 LAB — CBG MONITORING, ED
Glucose-Capillary: 39 mg/dL — CL (ref 70–99)
Glucose-Capillary: 62 mg/dL — ABNORMAL LOW (ref 70–99)
Glucose-Capillary: 101 mg/dL — ABNORMAL HIGH (ref 70–99)

## 2023-12-21 LAB — BASIC METABOLIC PANEL
Anion gap: 14 (ref 5–15)
BUN: 64 mg/dL — ABNORMAL HIGH (ref 6–20)
CO2: 23 mmol/L (ref 22–32)
Calcium: 7.3 mg/dL — ABNORMAL LOW (ref 8.9–10.3)
Chloride: 99 mmol/L (ref 98–111)
Creatinine, Ser: 5.84 mg/dL — ABNORMAL HIGH (ref 0.44–1.00)
GFR, Estimated: 8 mL/min — ABNORMAL LOW (ref >60)
Glucose, Bld: 73 mg/dL (ref 70–99)
Potassium: 4.1 mmol/L (ref 3.5–5.1)
Sodium: 136 mmol/L (ref 135–145)

## 2023-12-21 LAB — CBC
HCT: 27.7 % — ABNORMAL LOW (ref 36.0–46.0)
Hemoglobin: 9 g/dL — ABNORMAL LOW (ref 12.0–15.0)
MCH: 30.8 pg (ref 26.0–34.0)
MCHC: 32.5 g/dL (ref 30.0–36.0)
MCV: 94.9 fL (ref 80.0–100.0)
Platelets: 284 10*3/uL (ref 150–400)
RBC: 2.92 MIL/uL — ABNORMAL LOW (ref 3.87–5.11)
RDW: 18.4 % — ABNORMAL HIGH (ref 11.5–15.5)
WBC: 8.7 10*3/uL (ref 4.0–10.5)
nRBC: 0.2 % (ref 0.0–0.2)

## 2023-12-21 LAB — GLUCOSE, CAPILLARY: Glucose-Capillary: 116 mg/dL — ABNORMAL HIGH (ref 70–99)

## 2023-12-21 LAB — HEPATITIS B SURFACE ANTIGEN: Hepatitis B Surface Ag: NONREACTIVE

## 2023-12-21 LAB — HIV ANTIBODY (ROUTINE TESTING W REFLEX): HIV Screen 4th Generation wRfx: NONREACTIVE

## 2023-12-21 MED ORDER — ALTEPLASE 2 MG IJ SOLR
2.0000 mg | Freq: Once | INTRAMUSCULAR | Status: DC | PRN
  Filled 2023-12-21: qty 2

## 2023-12-21 MED ORDER — IPRATROPIUM-ALBUTEROL 0.5-2.5 (3) MG/3ML IN SOLN
3.0000 mL | Freq: Once | RESPIRATORY_TRACT | Status: AC
  Administered 2023-12-21: 3 mL via RESPIRATORY_TRACT
  Filled 2023-12-21: qty 3

## 2023-12-21 MED ORDER — HEPARIN SODIUM (PORCINE) 1000 UNIT/ML DIALYSIS
1000.0000 [IU] | INTRAMUSCULAR | Status: DC | PRN
Start: 2023-12-21 — End: 2023-12-21
  Filled 2023-12-21: qty 1

## 2023-12-21 MED ORDER — ORAL CARE MOUTH RINSE
15.0000 mL | OROMUCOSAL | Status: DC | PRN
Start: 2023-12-21 — End: 2023-12-22

## 2023-12-21 MED ORDER — PENTAFLUOROPROP-TETRAFLUOROETH EX AERO: 1.0000 | INHALATION_SPRAY | CUTANEOUS | Status: DC | PRN

## 2023-12-21 MED ORDER — LIDOCAINE-PRILOCAINE 2.5-2.5 % EX CREA: 1.0000 | TOPICAL_CREAM | CUTANEOUS | Status: DC | PRN

## 2023-12-21 MED ORDER — LIDOCAINE HCL (PF) 1 % IJ SOLN
5.0000 mL | INTRAMUSCULAR | Status: DC | PRN
Start: 2023-12-21 — End: 2023-12-21

## 2023-12-21 NOTE — ED Notes (Signed)
 Pt woken from sleep to assess neuro status. Pt wakes easily, converses using intepreter via google.

## 2023-12-21 NOTE — ED Notes (Signed)
 Pt transported to dialysis

## 2023-12-21 NOTE — ED Notes (Signed)
 Report to dialysis rn

## 2023-12-21 NOTE — Progress Notes (Signed)
 PROGRESS NOTE  Ruth Walters    DOB: 1966-04-28, 58 y.o.  FMW:[id number]    Code Status: Full Code   DOA: 12/20/2023   LOS: 0   Brief hospital course  Ruth Walters is a 58 y.o. female with medical history significant of ESRD on TTS schedule, multiple myeloma undergoing treatment, hypertension and diabetes mellitus came to ED with complaint of generalized malaise, mild nausea and diarrhea for the past 3 days.   Patient had a fall on Saturday and since then she was having mild headache, generalized aches and pains, worsening cough with poor appetite and p.o. intake. Missed her last HD session on Tuesday.   ED course and data reviewed.  Hemodynamically stable with borderline low blood pressure, desaturating in mid 80s requiring up to 2 L of oxygen.  Labs pertinent for leukocytosis at 14.4, hemoglobin 10 which is around her baseline, BUN 57, creatinine 5.66.  COVID-19, influenza and RSV negative Chest x-ray concerning for pulmonary edema CT head and cervical spine with a small volume acute subdural hematoma along the anterior left falx.  No mass effect.  No other evidence of any acute injury.  Did show scattered lucent foci throughout the bones, compatible with known history of multiple myeloma. CT lumbar spine with no acute injury.   Nephrology was consulted for dialysis. Neurosurgery was consulted by EDP and reviewed the subdural hematoma as stable without further follow up at this time.   Assessment & Plan  Principal Problem:   Acute hypoxic respiratory failure (HCC) Active Problems:   Malaise and fatigue   Subdural hematoma (HCC)   Fall at home, initial encounter   ESRD on dialysis [facility])   Diabetes mellitus without complication (HCC)   Hypertension   Multiple myeloma (HCC)   Acute pulmonary edema (HCC)  Acute hypoxic respiratory failure (HCC) Patient currently needing up to 2 L of oxygen, no baseline oxygen use.  Chest x-ray with some pulmonary edema.  Patient missed  today's dialysis.  Also having upper respiratory symptoms with cough and generalized malaise. RVP positive for parainfluenza 4 -Continue supplemental oxygen-wean as tolerated - HD for volume - supportive care PRN   Subdural hematoma (HCC) Likely secondary to fall on Saturday.  His small stable subdural hematoma noted on CT head.  EDP discussed with neurosurgery and there was no follow-up needed at this time.   Fall at home, initial encounter -PT/OT evaluation   ESRD on dialysis [facility]) -Nephrology was consulted  - getting HD today - RFP am   Diabetes mellitus without complication (HCC) A1c pending but unreliable in HD patient -SSI   Hypertension Blood pressure currently soft. On low-dose lisinopril at home -Holding home lisinopril   Multiple myeloma [facility]) Patient is undergoing chemotherapy, last session was on 12/05/2023 She missed 1 session afterwards yesterday as she was not feeling well. -Outpatient follow-up  Body mass index is 28.27 kg/m.  VTE ppx: heparin  injection 5,000 Units Start: 12/20/23 2000  Diet:     Diet   Diet heart healthy/carb modified Room service appropriate? Yes; Fluid consistency: Thin   Consultants: Nephrology   Subjective 12/21/23    Pt reports not feeling well. Denies pain. Endorses some shortness of breath and no further description given.   Spanish interpreter utilized for entirety of visit   Objective   Vitals:   12/21/23 0310 12/21/23 0347 12/21/23 0533 12/21/23 0700  BP: 101/62  (!) 98/56 (!) 98/55  Pulse: 75   76  Resp: 20   20  Temp:  97.7 F (36.5 C)    TempSrc:  Axillary    SpO2: 98%   91%  Height:       No intake or output data in the 24 hours ending 12/21/23 0825 There were no vitals filed for this visit.   Physical Exam:  General: awake, alert, ill-appearing  HEENT: atraumatic, clear conjunctiva, anicteric sclera, MMM, hearing grossly normal Respiratory: normal respiratory effort. Bilateral crackles bases, poor  inhalation depth Cardiovascular: quick capillary refill, normal S1/S2, RRR, no JVD, murmurs Gastrointestinal: soft, NT, ND Nervous: A&O x3. no gross focal neurologic deficits, normal speech Extremities: moves all equally, no edema, normal tone Skin: dry, intact, normal temperature, normal color. No rashes, lesions or ulcers on exposed skin Psychiatry: normal mood, congruent affect  Labs   I have personally reviewed the following labs and imaging studies CBC    Component Value Date/Time   WBC 8.7 12/21/2023 0345   RBC 2.92 (L) 12/21/2023 0345   HGB 9.0 (L) 12/21/2023 0345   HGB 12.9 07/31/2013 1006   HCT 27.7 (L) 12/21/2023 0345   HCT 36.5 07/31/2013 1006   PLT 284 12/21/2023 0345   PLT 437 07/31/2013 1006   MCV 94.9 12/21/2023 0345   MCV 85 07/31/2013 1006   MCH 30.8 12/21/2023 0345   MCHC 32.5 12/21/2023 0345   RDW 18.4 (H) 12/21/2023 0345   RDW 13.5 07/31/2013 1006   LYMPHSABS 0.2 (L) 12/20/2023 1353   MONOABS 0.3 12/20/2023 1353   EOSABS 0.1 12/20/2023 1353   BASOSABS 0.1 12/20/2023 1353      Latest Ref Rng & Units 12/21/2023    3:45 AM 12/20/2023    1:53 PM 08/02/2021    5:13 AM  BMP  Glucose 70 - 99 mg/dL 73  893  747   BUN 6 - 20 mg/dL 64  57  33   Creatinine 0.44 - 1.00 mg/dL 4.15  4.33  9.29   Sodium 135 - 145 mmol/L 136  136  139   Potassium 3.5 - 5.1 mmol/L 4.1  3.6  4.1   Chloride 98 - 111 mmol/L 99  95  104   CO2 22 - 32 mmol/L 23  23  25    Calcium 8.9 - 10.3 mg/dL 7.3  7.8  8.6     CT Lumbar Spine Wo Contrast Result Date: 12/20/2023 CLINICAL DATA:  Back trauma, no prior imaging (Age >= 16y). Back pain. Recent fall. History of multiple myeloma and renal failure. EXAM: CT LUMBAR SPINE WITHOUT CONTRAST TECHNIQUE: Multidetector CT imaging of the lumbar spine was performed without intravenous contrast administration. Multiplanar CT image reconstructions were also generated. RADIATION DOSE REDUCTION: This exam was performed according to the departmental  dose-optimization program which includes automated exposure control, adjustment of the mA and/or kV according to patient size and/or use of iterative reconstruction technique. COMPARISON:  None Available. FINDINGS: Segmentation: 5 lumbar type vertebrae. Alignment: Mild straightening of the normal lumbar lordosis. At most trace retrolisthesis of L2 on L3 and L3 on L4. Vertebrae: No acute fracture or suspicious osseous lesion. Paraspinal and other soft tissues: Partially visualized bilateral pleural effusions. Mild abdominal aortic atherosclerosis without aneurysm. Disc levels: T12-L1: Negative. L1-2: Mild left facet arthrosis without evidence of significant stenosis. L2-3: Mild disc space narrowing. Mild disc bulging without evidence of significant stenosis. L3-4: Mild disc space narrowing. Mild disc bulging results in borderline to mild spinal stenosis. L4-5: Mild disc bulging and mild facet arthrosis result in mild spinal stenosis and mild right neural foraminal stenosis. L5-S1:  Disc bulging, a shallow calcified central to left subarticular disc protrusion, and mild facet arthrosis result in likely mild left lateral recess stenosis and mild right neural foraminal stenosis without spinal stenosis. IMPRESSION: 1. No lumbar spine fracture. 2. Mild lumbar disc and facet degeneration with mild spinal stenosis at L4-5. 3. Partially visualized bilateral pleural effusions. 4.  Aortic Atherosclerosis (ICD10-I70.0). Electronically Signed   By: Dasie Hamburg M.D.   On: 12/20/2023 16:26   DG Chest 2 View Result Date: 12/20/2023 CLINICAL DATA:  Chest pain.  Mechanical fall in bathroom 2 days ago. EXAM: CHEST - 2 VIEW COMPARISON:  07/29/2021 FINDINGS: Grossly unchanged enlarged cardiac silhouette and mediastinal contours given reduced lung volumes. Right jugular approach dialysis catheter tips terminating within the superior aspect of the right atrium. The pulmonary vasculature is less distinct than present examination with  cephalization of flow. Trace bilateral effusions with associated bibasilar opacities, likely atelectasis. No pneumothorax. No acute osseous abnormalities. IMPRESSION: Findings suggestive of pulmonary edema with trace bilateral effusions and associated bibasilar opacities, likely atelectasis. Electronically Signed   By: Norleen Roulette M.D.   On: 12/20/2023 16:03   CT Cervical Spine Wo Contrast Result Date: 12/20/2023 CLINICAL DATA:  Head trauma, moderate-severe; Neck trauma, dangerous injury mechanism (Age 43-64y) EXAM: CT HEAD WITHOUT CONTRAST CT CERVICAL SPINE WITHOUT CONTRAST TECHNIQUE: Multidetector CT imaging of the head and cervical spine was performed following the standard protocol without intravenous contrast. Multiplanar CT image reconstructions of the cervical spine were also generated. RADIATION DOSE REDUCTION: This exam was performed according to the departmental dose-optimization program which includes automated exposure control, adjustment of the mA and/or kV according to patient size and/or use of iterative reconstruction technique. COMPARISON:  None Available. FINDINGS: CT HEAD FINDINGS Brain: Approximately 5 mm thick acute subdural hematoma along the anterior left falx (series 4, image 11). No significant mass effect. No evidence of acute large vascular territory infarct, mass lesion or hydrocephalus. Vascular: No hyperdense vessel. Skull: No acute fracture. Sinuses/Orbits: Scattered paranasal sinus mucosal thickening. No acute orbital findings. Other: No mastoid effusions. CT CERVICAL SPINE FINDINGS Alignment: No substantial sagittal subluxation. Skull base and vertebrae: No acute fracture. Scattered lucent foci throughout the visualized bones, compatible with known history of multiple myeloma. Soft tissues and spinal canal: No prevertebral fluid or swelling. No visible canal hematoma. Disc levels:  Moderate multilevel degenerative change. Upper chest: Partially imaged layering pleural effusions.  Otherwise, visualized lung apices are clear. IMPRESSION: 1. Small volume (5 mm thick) acute subdural hematoma along the anterior left falx. No mass effect. 2. No evidence of acute fracture or traumatic malalignment in the cervical spine. 3. Scattered lucent foci throughout the visualized bones, compatible with known history of multiple myeloma. 4. Partially imaged layering pleural effusions. Findings discussed with Powell Perry via telephone at 3 p.m. Electronically Signed   By: Gilmore GORMAN Molt M.D.   On: 12/20/2023 15:03   CT Head Wo Contrast Result Date: 12/20/2023 CLINICAL DATA:  Head trauma, moderate-severe; Neck trauma, dangerous injury mechanism (Age 19-64y) EXAM: CT HEAD WITHOUT CONTRAST CT CERVICAL SPINE WITHOUT CONTRAST TECHNIQUE: Multidetector CT imaging of the head and cervical spine was performed following the standard protocol without intravenous contrast. Multiplanar CT image reconstructions of the cervical spine were also generated. RADIATION DOSE REDUCTION: This exam was performed according to the departmental dose-optimization program which includes automated exposure control, adjustment of the mA and/or kV according to patient size and/or use of iterative reconstruction technique. COMPARISON:  None Available. FINDINGS: CT HEAD FINDINGS Brain: Approximately  5 mm thick acute subdural hematoma along the anterior left falx (series 4, image 11). No significant mass effect. No evidence of acute large vascular territory infarct, mass lesion or hydrocephalus. Vascular: No hyperdense vessel. Skull: No acute fracture. Sinuses/Orbits: Scattered paranasal sinus mucosal thickening. No acute orbital findings. Other: No mastoid effusions. CT CERVICAL SPINE FINDINGS Alignment: No substantial sagittal subluxation. Skull base and vertebrae: No acute fracture. Scattered lucent foci throughout the visualized bones, compatible with known history of multiple myeloma. Soft tissues and spinal canal: No  prevertebral fluid or swelling. No visible canal hematoma. Disc levels:  Moderate multilevel degenerative change. Upper chest: Partially imaged layering pleural effusions. Otherwise, visualized lung apices are clear. IMPRESSION: 1. Small volume (5 mm thick) acute subdural hematoma along the anterior left falx. No mass effect. 2. No evidence of acute fracture or traumatic malalignment in the cervical spine. 3. Scattered lucent foci throughout the visualized bones, compatible with known history of multiple myeloma. 4. Partially imaged layering pleural effusions. Findings discussed with Powell Perry via telephone at 3 p.m. Electronically Signed   By: Gilmore GORMAN Molt M.D.   On: 12/20/2023 15:03    Disposition Plan & Communication  Patient status: Observation  Admitted From: Home Planned disposition location: Home Anticipated discharge date: 1/3 pending clinical stability  Family Communication: none at bedside    Author: Marien LITTIE Piety, DO [facility] 12/21/2023, 8:25 AM   Available by Epic secure chat 7AM-7PM. If 7PM-7AM, please contact night-coverage.  [facility] contact information found on [email or web address].

## 2023-12-21 NOTE — ED Notes (Signed)
 Pt arouses easily from sleep. Phone plugged in for pt at left side to charge per request.

## 2023-12-21 NOTE — ED Notes (Signed)
 First CBG 39 and repeat CBG 62. Pt given juice and graham crackers with peanut butter. Pt declining breakfast tray. Dr. Dareen Piano at bedside and made aware.

## 2023-12-21 NOTE — ED Notes (Signed)
 Report to kelly, rn

## 2023-12-21 NOTE — Consult Note (Signed)
 [facility]  CONSULT NOTE    Date: 12/21/2023                  Patient Name:  Ruth Walters  MRN: [id number]  DOB: December 11, 1966  Age / Sex: 58 y.o., female         PCP: Pcp, No                 Service Requesting Consult: Dr. Lenon                 Reason for Consult: End Stage Renal Disease            History of Present Illness: Ruth Walters was admitted to [facility] for upper respiratory symptoms, diarrhea and generalized malaise and fatigue. Patient's last hemodialysis treatment was Saturday, December 28. She missed her regularly scheduled dialysis treatment yesterday due to not feeling well.   Patient states she has been having productive cough with white sputum, poor appetite, nausea and vomiting.   Patient has a left forearm AVG which was cannulated for the first time last week. She still has her tunneled dialysis catheter.   History taken with assistance of a Research Officer, [employer].    Medications: Outpatient medications: Medications Prior to Admission  Medication Sig Dispense Refill Last Dose/Taking   aspirin  EC 81 MG tablet Take 81 mg by mouth daily. Swallow whole.   Taking   fluticasone (FLONASE) 50 MCG/ACT nasal spray Place 1 spray into both nostrils daily.   Taking   furosemide (LASIX) 80 MG tablet Take 80 mg by mouth as directed. Take 1 tablet by mouth twice daily on non dialysis days: Monday, Wednesday, Friday and Sunday.   Taking   lisinopril (ZESTRIL) 5 MG tablet Take 2.5 mg by mouth daily.   Taking   sevelamer carbonate (RENVELA) 800 MG tablet Take 800 mg by mouth 3 (three) times daily with meals.   Taking   valACYclovir (VALTREX) 500 MG tablet Take 1 tablet by mouth every other day.   Taking   venetoclax (VENCLEXTA) 100 MG tablet Take 400 mg by mouth daily.   Taking   brimonidine  (ALPHAGAN ) 0.2 % ophthalmic solution Place 1 drop into the right eye 3 (three) times daily.      insulin  aspart (NOVOLOG ) 100 UNIT/ML injection Inject 5  Units into the skin 3 (three) times daily before meals.      insulin  glargine (LANTUS) 100 UNIT/ML injection Inject 48 Units into the skin at bedtime.       Current medications: Current Facility-Administered Medications  Medication Dose Route Frequency Provider Last Rate Last Admin   acetaminophen  (TYLENOL ) tablet 650 mg  650 mg Oral Q6H PRN Amin, Sumayya, MD   650 mg at 12/21/23 9471   Or   acetaminophen  (TYLENOL ) suppository 650 mg  650 mg Rectal Q6H PRN Amin, Sumayya, MD       alteplase  (CATHFLO ACTIVASE ) injection 2 mg  2 mg Intracatheter Once PRN Lateef, Munsoor, MD       aspirin  EC tablet 81 mg  81 mg Oral Daily Amin, Sumayya, MD       brimonidine  (ALPHAGAN ) 0.2 % ophthalmic solution 1 drop  1 drop Right Eye TID Amin, Sumayya, MD       Chlorhexidine  Gluconate Cloth 2 % PADS 6 each  6 each Topical Q0600 Lateef, Munsoor, MD       dextromethorphan -guaiFENesin  (MUCINEX  DM) 30-600 MG per 12 hr tablet 1 tablet  1 tablet Oral BID Amin,  Sumayya, MD   1 tablet at 12/20/23 2212   heparin  injection 1,000 Units  1,000 Units Dialysis PRN Lateef, Munsoor, MD       heparin  injection 5,000 Units  5,000 Units Subcutaneous Q8H Amin, Sumayya, MD   5,000 Units at 12/21/23 9472   insulin  aspart (novoLOG ) injection 0-5 Units  0-5 Units Subcutaneous QHS Amin, Sumayya, MD       insulin  aspart (novoLOG ) injection 0-9 Units  0-9 Units Subcutaneous TID WC Amin, Sumayya, MD       lidocaine  (PF) (XYLOCAINE ) 1 % injection 5 mL  5 mL Intradermal PRN Lateef, Munsoor, MD       lidocaine -prilocaine  (EMLA ) cream 1 Application  1 Application Topical PRN Lateef, Munsoor, MD       multivitamin with minerals tablet 1 tablet  1 tablet Oral Daily Amin, Sumayya, MD   1 tablet at 12/20/23 1937   ondansetron  (ZOFRAN ) tablet 4 mg  4 mg Oral Q6H PRN Amin, Sumayya, MD       Or   ondansetron  (ZOFRAN ) injection 4 mg  4 mg Intravenous Q6H PRN Amin, Sumayya, MD       pentafluoroprop-tetrafluoroeth (GEBAUERS) aerosol 1 Application  1  Application Topical PRN Lateef, Munsoor, MD       traMADol  (ULTRAM ) tablet 50 mg  50 mg Oral Q6H PRN Amin, Sumayya, MD   50 mg at 12/20/23 2211      Allergies: No Known Allergies    Past Medical History: Past Medical History:  Diagnosis Date   Cancer (HCC)    Multiple Myleoma   Diabetes mellitus without complication (HCC)    Hypertension    Low blood pressure      Past Surgical History: Past Surgical History:  Procedure Laterality Date   ABDOMINAL HYSTERECTOMY       Family History: Family History  Problem Relation Age of Onset   Leukemia Sister    Hyperlipidemia Mother      Social History: Social History   Socioeconomic History   Marital status: Single    Spouse name: Not on file   Number of children: Not on file   Years of education: Not on file   Highest education level: Not on file  Occupational History   Occupation: home maker  Tobacco Use   Smoking status: Never   Smokeless tobacco: Never  Vaping Use   Vaping status: Never Used  Substance and Sexual Activity   Alcohol use: No    Alcohol/week: 0.0 standard drinks of alcohol   Drug use: No   Sexual activity: Not on file  Other Topics Concern   Not on file  Social History Narrative   Not on file   Social Drivers of Health   Financial Resource Strain: Low Risk  (09/27/2023)   Received from [facility]   Overall Financial Resource Strain (CARDIA)    Difficulty of Paying Living Expenses: Not very hard  Food Insecurity: Food Insecurity Present (09/27/2023)   Received from [facility]   Hunger Vital Sign    Worried About Running Out of Food in the Last Year: Sometimes true    Ran Out of Food in the Last Year: Sometimes true  Transportation Needs: Unmet Transportation Needs (05/23/2023)   Received from [facility]   PRAPARE - Transportation    Lack of Transportation (Medical): Yes    Lack of Transportation (Non-Medical): Yes  Physical Activity: Not on file  Stress: Not on file  Social  Connections: Not on file  Intimate Partner Violence: Not At Risk (08/19/2022)   Received from [facility]   Humiliation, Afraid, Rape, and Kick questionnaire    Fear of Current or Ex-Partner: No    Emotionally Abused: No    Physically Abused: No    Sexually Abused: No     Vital Signs: Blood pressure 105/62, pulse 72, temperature 97.8 F (36.6 C), temperature source Oral, resp. rate 19, height 4' 11 (1.499 m), SpO2 97%.  Weight trends: There were no vitals filed for this visit.  Physical Exam: General: Uncomfortable, laying in stretcher  Head: Normocephalic, atraumatic. Moist oral mucosal membranes  Eyes: Anicteric, PERRL  Neck: Supple, trachea midline  Lungs:  Clear to auscultation  Heart: Regular rate and rhythm  Abdomen:  Soft, nontender,   Extremities:  trace peripheral edema.  Neurologic: Nonfocal, moving all four extremities  Skin: No lesions  Access: Left forearm AVG +bruit, +thrill. RIJ permcath     Lab results: Basic Metabolic Panel: Recent Labs  Lab 12/20/23 1353 12/21/23 0345  NA 136 136  K 3.6 4.1  CL 95* 99  CO2 23 23  GLUCOSE 106* 73  BUN 57* 64*  CREATININE 5.66* 5.84*  CALCIUM 7.8* 7.3*    Liver Function Tests: No results for input(s): AST, ALT, ALKPHOS, BILITOT, PROT, ALBUMIN in the last 168 hours. No results for input(s): LIPASE, AMYLASE in the last 168 hours. No results for input(s): AMMONIA in the last 168 hours.  CBC: Recent Labs  Lab 12/20/23 1353 12/21/23 0345  WBC 14.4* 8.7  NEUTROABS 13.7*  --   HGB 10.0* 9.0*  HCT 30.3* 27.7*  MCV 95.0 94.9  PLT 314 284    Cardiac Enzymes: No results for input(s): CKTOTAL, CKMB, CKMBINDEX, TROPONINI in the last 168 hours.  BNP: Invalid input(s): POCBNP  CBG: Recent Labs  Lab 12/20/23 2210 12/21/23 0835 12/21/23 0836 12/21/23 0940  GLUCAP 85 39* 62* 101*    Microbiology: Results for orders placed or performed during the hospital encounter of  12/20/23  Resp panel by RT-PCR (RSV, Flu A&B, Covid) Anterior Nasal Swab     Status: None   Collection Time: 12/20/23  4:09 PM   Specimen: Anterior Nasal Swab  Result Value Ref Range Status   SARS Coronavirus 2 by RT PCR NEGATIVE NEGATIVE Final    Comment: (NOTE) SARS-CoV-2 target nucleic acids are NOT DETECTED.  The SARS-CoV-2 RNA is generally detectable in upper respiratory specimens during the acute phase of infection. The lowest concentration of SARS-CoV-2 viral copies this assay can detect is 138 copies/mL. A negative result does not preclude SARS-Cov-2 infection and should not be used as the sole basis for treatment or other patient management decisions. A negative result may occur with  improper specimen collection/handling, submission of specimen other than nasopharyngeal swab, presence of viral mutation(s) within the areas targeted by this assay, and inadequate number of viral copies(<138 copies/mL). A negative result must be combined with clinical observations, patient history, and epidemiological information. The expected result is Negative.  Fact Sheet for Patients:  bloggercourse.com  Fact Sheet for Healthcare Providers:  seriousbroker.it  This test is no t yet approved or cleared by the [place]  FDA and  has been authorized for detection and/or diagnosis of SARS-CoV-2 by FDA under an Emergency Use Authorization (EUA). This EUA will remain  in effect (meaning this test can be used) for the duration of the COVID-19 declaration under Section 564(b)(1) of the Act, 21 U.S.C.section 360bbb-3(b)(1), unless the authorization is terminated  or  revoked sooner.       Influenza A by PCR NEGATIVE NEGATIVE Final   Influenza B by PCR NEGATIVE NEGATIVE Final    Comment: (NOTE) The Xpert Xpress SARS-CoV-2/FLU/RSV plus assay is intended as an aid in the diagnosis of influenza from Nasopharyngeal swab specimens and should not  be used as a sole basis for treatment. Nasal washings and aspirates are unacceptable for Xpert Xpress SARS-CoV-2/FLU/RSV testing.  Fact Sheet for Patients: bloggercourse.com  Fact Sheet for Healthcare Providers: seriousbroker.it  This test is not yet approved or cleared by the [place]  FDA and has been authorized for detection and/or diagnosis of SARS-CoV-2 by FDA under an Emergency Use Authorization (EUA). This EUA will remain in effect (meaning this test can be used) for the duration of the COVID-19 declaration under Section 564(b)(1) of the Act, 21 U.S.C. section 360bbb-3(b)(1), unless the authorization is terminated or revoked.     Resp Syncytial Virus by PCR NEGATIVE NEGATIVE Final    Comment: (NOTE) Fact Sheet for Patients: bloggercourse.com  Fact Sheet for Healthcare Providers: seriousbroker.it  This test is not yet approved or cleared by the [place]  FDA and has been authorized for detection and/or diagnosis of SARS-CoV-2 by FDA under an Emergency Use Authorization (EUA). This EUA will remain in effect (meaning this test can be used) for the duration of the COVID-19 declaration under Section 564(b)(1) of the Act, 21 U.S.C. section 360bbb-3(b)(1), unless the authorization is terminated or revoked.  Performed at [facility], [address] [place], [place] [address]   Respiratory (~20 pathogens) panel by PCR     Status: Abnormal   Collection Time: 12/20/23  9:41 PM   Specimen: Nasopharyngeal Swab; Respiratory  Result Value Ref Range Status   Adenovirus NOT DETECTED NOT DETECTED Final   Coronavirus 229E NOT DETECTED NOT DETECTED Final    Comment: (NOTE) The Coronavirus on the Respiratory Panel, DOES NOT test for the novel  Coronavirus (2019 nCoV)    Coronavirus HKU1 NOT DETECTED NOT DETECTED Final   Coronavirus NL63 NOT DETECTED NOT DETECTED  Final   Coronavirus OC43 NOT DETECTED NOT DETECTED Final   Metapneumovirus NOT DETECTED NOT DETECTED Final   Rhinovirus / Enterovirus NOT DETECTED NOT DETECTED Final   Influenza A NOT DETECTED NOT DETECTED Final   Influenza B NOT DETECTED NOT DETECTED Final   Parainfluenza Virus 1 NOT DETECTED NOT DETECTED Final   Parainfluenza Virus 2 NOT DETECTED NOT DETECTED Final   Parainfluenza Virus 3 NOT DETECTED NOT DETECTED Final   Parainfluenza Virus 4 DETECTED (A) NOT DETECTED Final   Respiratory Syncytial Virus NOT DETECTED NOT DETECTED Final   Bordetella pertussis NOT DETECTED NOT DETECTED Final   Bordetella Parapertussis NOT DETECTED NOT DETECTED Final   Chlamydophila pneumoniae NOT DETECTED NOT DETECTED Final   Mycoplasma pneumoniae NOT DETECTED NOT DETECTED Final    Comment: Performed at [facility], [address] [place], [place] [address]    Coagulation Studies: No results for input(s): LABPROT, INR in the last 72 hours.  Urinalysis: No results for input(s): COLORURINE, LABSPEC, PHURINE, GLUCOSEU, HGBUR, BILIRUBINUR, KETONESUR, PROTEINUR, UROBILINOGEN, NITRITE, LEUKOCYTESUR in the last 72 hours.  Invalid input(s): APPERANCEUR    Imaging: CT Lumbar Spine Wo Contrast Result Date: 12/20/2023 CLINICAL DATA:  Back trauma, no prior imaging (Age >= 16y). Back pain. Recent fall. History of multiple myeloma and renal failure. EXAM: CT LUMBAR SPINE WITHOUT CONTRAST TECHNIQUE: Multidetector CT imaging of the lumbar spine was performed without intravenous contrast administration. Multiplanar  CT image reconstructions were also generated. RADIATION DOSE REDUCTION: This exam was performed according to the departmental dose-optimization program which includes automated exposure control, adjustment of the mA and/or kV according to patient size and/or use of iterative reconstruction technique. COMPARISON:  None Available. FINDINGS: Segmentation: 5 lumbar type  vertebrae. Alignment: Mild straightening of the normal lumbar lordosis. At most trace retrolisthesis of L2 on L3 and L3 on L4. Vertebrae: No acute fracture or suspicious osseous lesion. Paraspinal and other soft tissues: Partially visualized bilateral pleural effusions. Mild abdominal aortic atherosclerosis without aneurysm. Disc levels: T12-L1: Negative. L1-2: Mild left facet arthrosis without evidence of significant stenosis. L2-3: Mild disc space narrowing. Mild disc bulging without evidence of significant stenosis. L3-4: Mild disc space narrowing. Mild disc bulging results in borderline to mild spinal stenosis. L4-5: Mild disc bulging and mild facet arthrosis result in mild spinal stenosis and mild right neural foraminal stenosis. L5-S1: Disc bulging, a shallow calcified central to left subarticular disc protrusion, and mild facet arthrosis result in likely mild left lateral recess stenosis and mild right neural foraminal stenosis without spinal stenosis. IMPRESSION: 1. No lumbar spine fracture. 2. Mild lumbar disc and facet degeneration with mild spinal stenosis at L4-5. 3. Partially visualized bilateral pleural effusions. 4.  Aortic Atherosclerosis (ICD10-I70.0). Electronically Signed   By: Dasie Hamburg M.D.   On: 12/20/2023 16:26   DG Chest 2 View Result Date: 12/20/2023 CLINICAL DATA:  Chest pain.  Mechanical fall in bathroom 2 days ago. EXAM: CHEST - 2 VIEW COMPARISON:  07/29/2021 FINDINGS: Grossly unchanged enlarged cardiac silhouette and mediastinal contours given reduced lung volumes. Right jugular approach dialysis catheter tips terminating within the superior aspect of the right atrium. The pulmonary vasculature is less distinct than present examination with cephalization of flow. Trace bilateral effusions with associated bibasilar opacities, likely atelectasis. No pneumothorax. No acute osseous abnormalities. IMPRESSION: Findings suggestive of pulmonary edema with trace bilateral effusions and  associated bibasilar opacities, likely atelectasis. Electronically Signed   By: Norleen Roulette M.D.   On: 12/20/2023 16:03   CT Cervical Spine Wo Contrast Result Date: 12/20/2023 CLINICAL DATA:  Head trauma, moderate-severe; Neck trauma, dangerous injury mechanism (Age 9-64y) EXAM: CT HEAD WITHOUT CONTRAST CT CERVICAL SPINE WITHOUT CONTRAST TECHNIQUE: Multidetector CT imaging of the head and cervical spine was performed following the standard protocol without intravenous contrast. Multiplanar CT image reconstructions of the cervical spine were also generated. RADIATION DOSE REDUCTION: This exam was performed according to the departmental dose-optimization program which includes automated exposure control, adjustment of the mA and/or kV according to patient size and/or use of iterative reconstruction technique. COMPARISON:  None Available. FINDINGS: CT HEAD FINDINGS Brain: Approximately 5 mm thick acute subdural hematoma along the anterior left falx (series 4, image 11). No significant mass effect. No evidence of acute large vascular territory infarct, mass lesion or hydrocephalus. Vascular: No hyperdense vessel. Skull: No acute fracture. Sinuses/Orbits: Scattered paranasal sinus mucosal thickening. No acute orbital findings. Other: No mastoid effusions. CT CERVICAL SPINE FINDINGS Alignment: No substantial sagittal subluxation. Skull base and vertebrae: No acute fracture. Scattered lucent foci throughout the visualized bones, compatible with known history of multiple myeloma. Soft tissues and spinal canal: No prevertebral fluid or swelling. No visible canal hematoma. Disc levels:  Moderate multilevel degenerative change. Upper chest: Partially imaged layering pleural effusions. Otherwise, visualized lung apices are clear. IMPRESSION: 1. Small volume (5 mm thick) acute subdural hematoma along the anterior left falx. No mass effect. 2. No evidence of acute fracture or traumatic  malalignment in the cervical spine. 3.  Scattered lucent foci throughout the visualized bones, compatible with known history of multiple myeloma. 4. Partially imaged layering pleural effusions. Findings discussed with Powell Perry via telephone at 3 p.m. Electronically Signed   By: Gilmore GORMAN Molt M.D.   On: 12/20/2023 15:03   CT Head Wo Contrast Result Date: 12/20/2023 CLINICAL DATA:  Head trauma, moderate-severe; Neck trauma, dangerous injury mechanism (Age 58-64y) EXAM: CT HEAD WITHOUT CONTRAST CT CERVICAL SPINE WITHOUT CONTRAST TECHNIQUE: Multidetector CT imaging of the head and cervical spine was performed following the standard protocol without intravenous contrast. Multiplanar CT image reconstructions of the cervical spine were also generated. RADIATION DOSE REDUCTION: This exam was performed according to the departmental dose-optimization program which includes automated exposure control, adjustment of the mA and/or kV according to patient size and/or use of iterative reconstruction technique. COMPARISON:  None Available. FINDINGS: CT HEAD FINDINGS Brain: Approximately 5 mm thick acute subdural hematoma along the anterior left falx (series 4, image 11). No significant mass effect. No evidence of acute large vascular territory infarct, mass lesion or hydrocephalus. Vascular: No hyperdense vessel. Skull: No acute fracture. Sinuses/Orbits: Scattered paranasal sinus mucosal thickening. No acute orbital findings. Other: No mastoid effusions. CT CERVICAL SPINE FINDINGS Alignment: No substantial sagittal subluxation. Skull base and vertebrae: No acute fracture. Scattered lucent foci throughout the visualized bones, compatible with known history of multiple myeloma. Soft tissues and spinal canal: No prevertebral fluid or swelling. No visible canal hematoma. Disc levels:  Moderate multilevel degenerative change. Upper chest: Partially imaged layering pleural effusions. Otherwise, visualized lung apices are clear. IMPRESSION: 1. Small volume (5 mm  thick) acute subdural hematoma along the anterior left falx. No mass effect. 2. No evidence of acute fracture or traumatic malalignment in the cervical spine. 3. Scattered lucent foci throughout the visualized bones, compatible with known history of multiple myeloma. 4. Partially imaged layering pleural effusions. Findings discussed with Powell Perry via telephone at 3 p.m. Electronically Signed   By: Gilmore GORMAN Molt M.D.   On: 12/20/2023 15:03     Assessment & Plan: Ruth Walters is a 58 y.o.  female with end stage renal disease, multiple myeloma, hypertension, diabetes mellitus type II insulin  dependent, who was admitted to [facility] on 12/20/2023 for Acute pulmonary edema (HCC) [J81.0] Subdural hematoma (HCC) [S06.5XAA] Acute hypoxic respiratory failure (HCC) [J96.01]  [facility] TTS   End Stage Renal Disease: with fluid overload and pulmonary edema on examination.  - emergent hemodialysis treatment this morning.  - Will plan on dialysis again tomorrow to stay with her schedule.  - Use tunneled catheter while inpatient  Hypotension with history of Hypertension with chronic kidney disease: 90/54 - hypotensive.  - holding home lisinopril and furosemide  Anemia with chronic kidney disease and multiple myeloma: hemoglobin of 9, normocytic. Patient getting carfilzomib infusions at [facility].   Diabetes mellitus type II with chronic kidney disease: insulin  dependent.  - continue glucose control  Secondary Hyperparathyroidism: not currently on a phosphate binder.      LOS: 0 Lyrica Mcclarty 1/1/202511:48 AM

## 2023-12-21 NOTE — ED Notes (Signed)
Report from lexie, rn.  

## 2023-12-21 NOTE — Progress Notes (Signed)
 OT Cancellation Note  Patient Details Name: Ruth Walters MRN: [id number] DOB: 1966/10/13   Cancelled Treatment:    Reason Eval/Treat Not Completed: Other (comment) (pt is OTF for HD, OT will reattempt as able)  Therisa Sheffield, OTD OTR/L  12/21/23, 10:32 AM

## 2023-12-21 NOTE — ED Notes (Signed)
 Pt resting, head of bed at 45 degrees, no urine noted in suction can. Call bell at left side, pt has purse at left side.

## 2023-12-21 NOTE — Progress Notes (Addendum)
 1020: Assessment was done with the help of an interpreter. Per patient she was having on and off chest pain upon admission. Dr. Douglas was notified.  Patient's set up clotted. Awaiting new set up.  1340: HD restarted  1400: Patient is complaining of dizziness, no complaints of pain and BP: 99/63. UF was paused. Will monitor closely. Assessment was done with the help of an interpreter.

## 2023-12-21 NOTE — ED Notes (Signed)
 Jawo, np notified of blood pressures, states via secure chat if map less than 65 to wake pt up and obtain manual blood pressure.

## 2023-12-21 NOTE — ED Notes (Signed)
 Jawo, np notified of manual blood pressure, no new orders received.

## 2023-12-21 NOTE — ED Notes (Signed)
 Interpreter service ipad utilized to provide care. Pt complains of headache 4/10 asking for t ylenol. Tylenol  provided. Pt states she does not make urine every day, sometimes goes days without urinating. Pt states last urination yesterday in ED. Pt declines offer to repostion bed and place gown over body. Pt denies other needs.

## 2023-12-21 NOTE — Progress Notes (Signed)
 PT Cancellation Note  Patient Details Name: Ruth Walters MRN: [id number] DOB: 05-17-1966   Cancelled Treatment:    Reason Eval/Treat Not Completed: Patient at procedure or test/unavailable Orders received, chart reviewed. Patient off unit at HD. Will follow up for PT evaluation at later date/time.   Maryanne Finder, PT, DPT Physical Therapist - [place]  [facility]    Lowen Barringer A Myran Arcia 12/21/2023, 10:03 AM

## 2023-12-21 NOTE — ED Notes (Signed)
 Pt awakes easily for am lab work. Sips of water provided, pt consumed approx 30mL. Pt is currently dry, no urine noted. 3mm pupils

## 2023-12-21 NOTE — Progress Notes (Signed)
  Received patient in bed to unit.   Informed consent signed and in chart.    TX duration: 3:25 Rinsed back 5 min early d/t c/o dizziness, chest pain and difficulty breathing.  MD made aware.  Pt will go back to ED for breathing tx.       Transported back to ED  Hand-off given to patient's nurse. RN reported to primary nurse    Access used: R HD Catheter Access issues:    Total UF removed: 0.2L Medication(s) given: none Post HD VS:       Ruth Walters  [facility]

## 2023-12-21 NOTE — ED Notes (Signed)
 Pt has purewick in place, no urine noted at this time.

## 2023-12-21 DEATH — deceased

## 2023-12-22 LAB — HEPATITIS B SURFACE ANTIBODY, QUANTITATIVE: Hep B S AB Quant (Post): <3.5 m[IU]/mL — ABNORMAL LOW

## 2023-12-22 LAB — HEMOGLOBIN A1C
Hgb A1c MFr Bld: 5.8 % — ABNORMAL HIGH (ref 4.8–5.6)
Mean Plasma Glucose: 120 mg/dL

## 2023-12-22 LAB — GLUCOSE, CAPILLARY: Glucose-Capillary: 52 mg/dL — ABNORMAL LOW (ref 70–99)

## 2024-01-21 NOTE — ED Provider Notes (Signed)
 [facility] Department of Emergency Medicine   Code Blue CONSULT NOTE  Chief Complaint: Cardiac arrest/unresponsive   Level V Caveat: Unresponsive  History of present illness: I was contacted by the hospital for a CODE BLUE cardiac arrest upstairs and presented to the patient's bedside.   CPR in progress and first epi being provided as I enter the room.  Hemodialysis patient admitted for hypoxic respiratory failure, parainfluenza.  Normal  potassium 24 hours ago.  Dialyzed 12 hours ago.  Bedside nurse reports no significant events otherwise.  Patient was found pulseless on a routine vital check.   We continue ACLS protocols for about 20 minutes with continued asystolic rhythm and no ROSC.  I intubate the patient.  In addition to epinephrine every 3 minutes, we provide 2 A of bicarb, 2 g of calcium.  Fingerstick glucose of 52 so we provide D50.   Time of death called at 4:40 AM  ROS: Unable to obtain, Level V caveat  Scheduled Meds:  aspirin  EC  81 mg Oral Daily   brimonidine   1 drop Right Eye TID   Chlorhexidine  Gluconate Cloth  6 each Topical Q0600   dextromethorphan -guaiFENesin   1 tablet Oral BID   heparin   5,000 Units Subcutaneous Q8H   insulin  aspart  0-9 Units Subcutaneous TID WC   multivitamin with minerals  1 tablet Oral Daily   Continuous Infusions: PRN Meds:.acetaminophen  **OR** acetaminophen , ondansetron  **OR** ondansetron  (ZOFRAN ) IV, mouth rinse, traMADol  Past Medical History:  Diagnosis Date   Cancer (HCC)    Multiple Myleoma   Diabetes mellitus without complication (HCC)    Hypertension    Low blood pressure    Past Surgical History:  Procedure Laterality Date   ABDOMINAL HYSTERECTOMY     Social History   Socioeconomic History   Marital status: Single    Spouse name: Not on file   Number of children: Not on file   Years of education: Not on file   Highest education level: Not on file  Occupational History   Occupation: home maker   Tobacco Use   Smoking status: Never   Smokeless tobacco: Never  Vaping Use   Vaping status: Never Used  Substance and Sexual Activity   Alcohol use: No    Alcohol/week: 0.0 standard drinks of alcohol   Drug use: No   Sexual activity: Not on file  Other Topics Concern   Not on file  Social History Narrative   Not on file   Social Drivers of Health   Financial Resource Strain: Low Risk  (09/27/2023)   [facility]   Overall Financial Resource Strain (CARDIA)    Difficulty of Paying Living Expenses: Not very hard  Food Insecurity: No Food Insecurity (12/21/2023)   Hunger Vital Sign    Worried About Running Out of Food in the Last Year: Never true    Ran Out of Food in the Last Year: Never true  Recent Concern: Food Insecurity - Food Insecurity Present (09/27/2023)   Received from [facility]   Hunger Vital Sign    Worried About Running Out of Food in the Last Year: Sometimes true    Ran Out of Food in the Last Year: Sometimes true  Transportation Needs: No Transportation Needs (12/21/2023)   PRAPARE - Administrator, Civil Service (Medical): No    Lack of Transportation (Non-Medical): No  Physical Activity: Not on file  Stress: Not on file  Social Connections: Not on file  Intimate Partner Violence: Not At Risk (12/21/2023)   Humiliation, Afraid, Rape, and Kick questionnaire    Fear of Current or Ex-Partner: No    Emotionally Abused: No    Physically Abused: No    Sexually Abused: No   No Known Allergies  Last set of Vital Signs (not current) Vitals:   12/21/23 2047 12/21/23 2056  BP: (!) 90/53   Pulse: 81 82  Resp: 16   Temp: (!) 97.5 F (36.4 C)   SpO2: (!) 89% 91%      Physical Exam Gen: unresponsive Cardiovascular: pulseless  Resp: apneic. Breath sounds equal bilaterally with bagging  Abd: nondistended  Neuro: GCS 3, unresponsive to pain  HEENT: No blood in posterior pharynx, gag reflex absent  Neck: No crepitus   Musculoskeletal: No deformity  Skin: warm  Procedures (when applicable, including Critical Care time): .Critical Care  Performed by: Claudene Rover, MD Authorized by: Claudene Rover, MD   Critical care provider statement:    Critical care time (minutes):  30   Critical care time was exclusive of:  Separately billable procedures and treating other patients   Critical care was necessary to treat or prevent imminent or life-threatening deterioration of the following conditions:  Cardiac failure, circulatory failure and respiratory failure   Critical care was time spent personally by me on the following activities:  Development of treatment plan with patient or surrogate, discussions with consultants, evaluation of patient's response to treatment, examination of patient, ordering and review of laboratory studies, ordering and review of radiographic studies, ordering and performing treatments and interventions, pulse oximetry, re-evaluation of patient's condition and review of old charts .1-3 Lead EKG Interpretation  Performed by: Claudene Rover, MD Authorized by: Claudene Rover, MD     Interpretation: abnormal     ECG rate:  0   Rhythm: asystole   Procedure Name: Intubation Date/Time: 12-31-2023 4:54 AM  Performed by: Claudene Rover, MDPre-anesthesia Checklist: Patient identified, Patient being monitored, Emergency Drugs available, Timeout performed and Suction available Oxygen Delivery Method: Non-rebreather mask Preoxygenation: Pre-oxygenation with 100% oxygen Induction Type: Rapid sequence Ventilation: Mask ventilation without difficulty Laryngoscope Size: Glidescope and 4 Tube size: 7.5 mm Number of attempts: 1 Airway Equipment and Method: Rigid stylet Placement Confirmation: ETT inserted through vocal cords under direct vision, CO2 detector and Breath sounds checked- equal and bilateral Secured at: 23 cm Tube secured with: ETT holder       MDM / Assessment and Plan As above.  ACLS  protocols continued, intubated.  Addressed hypoglycemia, empiric calcium and bicarb but no improvement and I am concerned about futile effort.  Time of death 4:40 AM.  Repeatedly called family but no response.  Message left.      Claudene Rover, MD December 31, 2023 [phone]

## 2024-01-21 NOTE — Death Summary Note (Signed)
 DEATH SUMMARY   Patient Details  Name: Ruth Walters MRN: [id number] DOB: 1966/01/16 PCP:Pcp, No Admission/Discharge Information   Admit Date:  2024/01/12  Date of Death: Date of Death: 01-14-2024  Time of Death: Time of Death: 0440  Length of Stay: 1   Principle Cause of death: cardiac arrest  Hospital Diagnoses: Principal Problem:   Acute hypoxic respiratory failure (HCC) Active Problems:   Malaise and fatigue   Subdural hematoma (HCC)   Fall at home, initial encounter   ESRD on dialysis [facility])   Diabetes mellitus without complication (HCC)   Hypertension   Multiple myeloma (HCC)   Acute pulmonary edema [facility])   Hospital Course: Ruth Walters is a 58 y.o. female with medical history significant of ESRD on TTS schedule, multiple myeloma undergoing treatment, hypertension and diabetes mellitus came to ED with complaint of generalized malaise, mild nausea and diarrhea for the past 3 days.   Patient had a fall on Saturday and since then she was having mild headache, generalized aches and pains, worsening cough with poor appetite and p.o. intake. Missed her last HD session on Tuesday.   ED course and data reviewed.  Hemodynamically stable with borderline low blood pressure, desaturating in mid 80s requiring up to 2 L of oxygen.  Labs pertinent for leukocytosis at 14.4, hemoglobin 10 which is around her baseline, BUN 57, creatinine 5.66.  COVID-19, influenza and RSV negative. Parainfluenza was positive.  Chest x-ray concerning for pulmonary edema CT head and cervical spine with a small volume acute subdural hematoma along the anterior left falx.  No mass effect.  No other evidence of any acute injury.  Did show scattered lucent foci throughout the bones, compatible with known history of multiple myeloma. CT lumbar spine with no acute injury.   Nephrology was consulted for dialysis. She underwent emergent HD to address her hypervolemia.  She underwent supportive care for  her parainfluenza infection and required progressively increasing amounts of oxygen support up to 6L.  Neurosurgery was consulted by EDP and reviewed the subdural hematoma as stable without further follow up at this time.   Overnight, patient was stable and when she was checked at routine vital check this morning, she was found to be unresponsive. Code blue was called and CPR conducted as described in separate code documentation.  She did not survive. Her family was notified and came to the hospital to see her.   Procedures: HD, code blue  Consultations: nephrology  The results of significant diagnostics from this hospitalization (including imaging, microbiology, ancillary and laboratory) are listed below for reference.   Significant Diagnostic Studies: CT Lumbar Spine Wo Contrast Result Date: 2024-01-12 CLINICAL DATA:  Back trauma, no prior imaging (Age >= 16y). Back pain. Recent fall. History of multiple myeloma and renal failure. EXAM: CT LUMBAR SPINE WITHOUT CONTRAST TECHNIQUE: Multidetector CT imaging of the lumbar spine was performed without intravenous contrast administration. Multiplanar CT image reconstructions were also generated. RADIATION DOSE REDUCTION: This exam was performed according to the departmental dose-optimization program which includes automated exposure control, adjustment of the mA and/or kV according to patient size and/or use of iterative reconstruction technique. COMPARISON:  None Available. FINDINGS: Segmentation: 5 lumbar type vertebrae. Alignment: Mild straightening of the normal lumbar lordosis. At most trace retrolisthesis of L2 on L3 and L3 on L4. Vertebrae: No acute fracture or suspicious osseous lesion. Paraspinal and other soft tissues: Partially visualized bilateral pleural effusions. Mild abdominal aortic atherosclerosis without aneurysm. Disc levels: T12-L1: Negative. L1-2: Mild  left facet arthrosis without evidence of significant stenosis. L2-3: Mild disc  space narrowing. Mild disc bulging without evidence of significant stenosis. L3-4: Mild disc space narrowing. Mild disc bulging results in borderline to mild spinal stenosis. L4-5: Mild disc bulging and mild facet arthrosis result in mild spinal stenosis and mild right neural foraminal stenosis. L5-S1: Disc bulging, a shallow calcified central to left subarticular disc protrusion, and mild facet arthrosis result in likely mild left lateral recess stenosis and mild right neural foraminal stenosis without spinal stenosis. IMPRESSION: 1. No lumbar spine fracture. 2. Mild lumbar disc and facet degeneration with mild spinal stenosis at L4-5. 3. Partially visualized bilateral pleural effusions. 4.  Aortic Atherosclerosis (ICD10-I70.0). Electronically Signed   By: Dasie Hamburg M.D.   On: 12/20/2023 16:26   DG Chest 2 View Result Date: 12/20/2023 CLINICAL DATA:  Chest pain.  Mechanical fall in bathroom 2 days ago. EXAM: CHEST - 2 VIEW COMPARISON:  07/29/2021 FINDINGS: Grossly unchanged enlarged cardiac silhouette and mediastinal contours given reduced lung volumes. Right jugular approach dialysis catheter tips terminating within the superior aspect of the right atrium. The pulmonary vasculature is less distinct than present examination with cephalization of flow. Trace bilateral effusions with associated bibasilar opacities, likely atelectasis. No pneumothorax. No acute osseous abnormalities. IMPRESSION: Findings suggestive of pulmonary edema with trace bilateral effusions and associated bibasilar opacities, likely atelectasis. Electronically Signed   By: Norleen Roulette M.D.   On: 12/20/2023 16:03   CT Cervical Spine Wo Contrast Result Date: 12/20/2023 CLINICAL DATA:  Head trauma, moderate-severe; Neck trauma, dangerous injury mechanism (Age 64-64y) EXAM: CT HEAD WITHOUT CONTRAST CT CERVICAL SPINE WITHOUT CONTRAST TECHNIQUE: Multidetector CT imaging of the head and cervical spine was performed following the standard  protocol without intravenous contrast. Multiplanar CT image reconstructions of the cervical spine were also generated. RADIATION DOSE REDUCTION: This exam was performed according to the departmental dose-optimization program which includes automated exposure control, adjustment of the mA and/or kV according to patient size and/or use of iterative reconstruction technique. COMPARISON:  None Available. FINDINGS: CT HEAD FINDINGS Brain: Approximately 5 mm thick acute subdural hematoma along the anterior left falx (series 4, image 11). No significant mass effect. No evidence of acute large vascular territory infarct, mass lesion or hydrocephalus. Vascular: No hyperdense vessel. Skull: No acute fracture. Sinuses/Orbits: Scattered paranasal sinus mucosal thickening. No acute orbital findings. Other: No mastoid effusions. CT CERVICAL SPINE FINDINGS Alignment: No substantial sagittal subluxation. Skull base and vertebrae: No acute fracture. Scattered lucent foci throughout the visualized bones, compatible with known history of multiple myeloma. Soft tissues and spinal canal: No prevertebral fluid or swelling. No visible canal hematoma. Disc levels:  Moderate multilevel degenerative change. Upper chest: Partially imaged layering pleural effusions. Otherwise, visualized lung apices are clear. IMPRESSION: 1. Small volume (5 mm thick) acute subdural hematoma along the anterior left falx. No mass effect. 2. No evidence of acute fracture or traumatic malalignment in the cervical spine. 3. Scattered lucent foci throughout the visualized bones, compatible with known history of multiple myeloma. 4. Partially imaged layering pleural effusions. Findings discussed with Powell Perry via telephone at 3 p.m. Electronically Signed   By: Gilmore GORMAN Molt M.D.   On: 12/20/2023 15:03   CT Head Wo Contrast Result Date: 12/20/2023 CLINICAL DATA:  Head trauma, moderate-severe; Neck trauma, dangerous injury mechanism (Age 48-64y) EXAM: CT  HEAD WITHOUT CONTRAST CT CERVICAL SPINE WITHOUT CONTRAST TECHNIQUE: Multidetector CT imaging of the head and cervical spine was performed following the standard protocol without  intravenous contrast. Multiplanar CT image reconstructions of the cervical spine were also generated. RADIATION DOSE REDUCTION: This exam was performed according to the departmental dose-optimization program which includes automated exposure control, adjustment of the mA and/or kV according to patient size and/or use of iterative reconstruction technique. COMPARISON:  None Available. FINDINGS: CT HEAD FINDINGS Brain: Approximately 5 mm thick acute subdural hematoma along the anterior left falx (series 4, image 11). No significant mass effect. No evidence of acute large vascular territory infarct, mass lesion or hydrocephalus. Vascular: No hyperdense vessel. Skull: No acute fracture. Sinuses/Orbits: Scattered paranasal sinus mucosal thickening. No acute orbital findings. Other: No mastoid effusions. CT CERVICAL SPINE FINDINGS Alignment: No substantial sagittal subluxation. Skull base and vertebrae: No acute fracture. Scattered lucent foci throughout the visualized bones, compatible with known history of multiple myeloma. Soft tissues and spinal canal: No prevertebral fluid or swelling. No visible canal hematoma. Disc levels:  Moderate multilevel degenerative change. Upper chest: Partially imaged layering pleural effusions. Otherwise, visualized lung apices are clear. IMPRESSION: 1. Small volume (5 mm thick) acute subdural hematoma along the anterior left falx. No mass effect. 2. No evidence of acute fracture or traumatic malalignment in the cervical spine. 3. Scattered lucent foci throughout the visualized bones, compatible with known history of multiple myeloma. 4. Partially imaged layering pleural effusions. Findings discussed with Powell Perry via telephone at 3 p.m. Electronically Signed   By: Gilmore GORMAN Molt M.D.   On: 12/20/2023  15:03    Microbiology: Recent Results (from the past 240 hours)  Resp panel by RT-PCR (RSV, Flu A&B, Covid) Anterior Nasal Swab     Status: None   Collection Time: 12/20/23  4:09 PM   Specimen: Anterior Nasal Swab  Result Value Ref Range Status   SARS Coronavirus 2 by RT PCR NEGATIVE NEGATIVE Final    Comment: (NOTE) SARS-CoV-2 target nucleic acids are NOT DETECTED.  The SARS-CoV-2 RNA is generally detectable in upper respiratory specimens during the acute phase of infection. The lowest concentration of SARS-CoV-2 viral copies this assay can detect is 138 copies/mL. A negative result does not preclude SARS-Cov-2 infection and should not be used as the sole basis for treatment or other patient management decisions. A negative result may occur with  improper specimen collection/handling, submission of specimen other than nasopharyngeal swab, presence of viral mutation(s) within the areas targeted by this assay, and inadequate number of viral copies(<138 copies/mL). A negative result must be combined with clinical observations, patient history, and epidemiological information. The expected result is Negative.  Fact Sheet for Patients:  bloggercourse.com  Fact Sheet for Healthcare Providers:  seriousbroker.it  This test is no t yet approved or cleared by the [place]  FDA and  has been authorized for detection and/or diagnosis of SARS-CoV-2 by FDA under an Emergency Use Authorization (EUA). This EUA will remain  in effect (meaning this test can be used) for the duration of the COVID-19 declaration under Section 564(b)(1) of the Act, 21 U.S.C.section 360bbb-3(b)(1), unless the authorization is terminated  or revoked sooner.       Influenza A by PCR NEGATIVE NEGATIVE Final   Influenza B by PCR NEGATIVE NEGATIVE Final    Comment: (NOTE) The Xpert Xpress SARS-CoV-2/FLU/RSV plus assay is intended as an aid in the diagnosis of  influenza from Nasopharyngeal swab specimens and should not be used as a sole basis for treatment. Nasal washings and aspirates are unacceptable for Xpert Xpress SARS-CoV-2/FLU/RSV testing.  Fact Sheet for Patients: bloggercourse.com  Fact Sheet for Healthcare  Providers: seriousbroker.it  This test is not yet approved or cleared by the [place]  FDA and has been authorized for detection and/or diagnosis of SARS-CoV-2 by FDA under an Emergency Use Authorization (EUA). This EUA will remain in effect (meaning this test can be used) for the duration of the COVID-19 declaration under Section 564(b)(1) of the Act, 21 U.S.C. section 360bbb-3(b)(1), unless the authorization is terminated or revoked.     Resp Syncytial Virus by PCR NEGATIVE NEGATIVE Final    Comment: (NOTE) Fact Sheet for Patients: bloggercourse.com  Fact Sheet for Healthcare Providers: seriousbroker.it  This test is not yet approved or cleared by the [place]  FDA and has been authorized for detection and/or diagnosis of SARS-CoV-2 by FDA under an Emergency Use Authorization (EUA). This EUA will remain in effect (meaning this test can be used) for the duration of the COVID-19 declaration under Section 564(b)(1) of the Act, 21 U.S.C. section 360bbb-3(b)(1), unless the authorization is terminated or revoked.  Performed at [facility], [address] [place], [place] [address]   Respiratory (~20 pathogens) panel by PCR     Status: Abnormal   Collection Time: 12/20/23  9:41 PM   Specimen: Nasopharyngeal Swab; Respiratory  Result Value Ref Range Status   Adenovirus NOT DETECTED NOT DETECTED Final   Coronavirus 229E NOT DETECTED NOT DETECTED Final    Comment: (NOTE) The Coronavirus on the Respiratory Panel, DOES NOT test for the novel  Coronavirus (2019 nCoV)    Coronavirus HKU1 NOT DETECTED NOT  DETECTED Final   Coronavirus NL63 NOT DETECTED NOT DETECTED Final   Coronavirus OC43 NOT DETECTED NOT DETECTED Final   Metapneumovirus NOT DETECTED NOT DETECTED Final   Rhinovirus / Enterovirus NOT DETECTED NOT DETECTED Final   Influenza A NOT DETECTED NOT DETECTED Final   Influenza B NOT DETECTED NOT DETECTED Final   Parainfluenza Virus 1 NOT DETECTED NOT DETECTED Final   Parainfluenza Virus 2 NOT DETECTED NOT DETECTED Final   Parainfluenza Virus 3 NOT DETECTED NOT DETECTED Final   Parainfluenza Virus 4 DETECTED (A) NOT DETECTED Final   Respiratory Syncytial Virus NOT DETECTED NOT DETECTED Final   Bordetella pertussis NOT DETECTED NOT DETECTED Final   Bordetella Parapertussis NOT DETECTED NOT DETECTED Final   Chlamydophila pneumoniae NOT DETECTED NOT DETECTED Final   Mycoplasma pneumoniae NOT DETECTED NOT DETECTED Final    Comment: Performed at [facility], [address], [place], [place] [address]    Time spent: 35 minutes  Signed: Marien LITTIE Piety, MD 01-10-2024

## 2024-01-21 NOTE — Plan of Care (Signed)
 Pt is alert and oriented x 4. Oxygen at 6 L to keep sats to 90%. Vitals stable. Pain in chest and left shoulder. Old lidocaine  patch noted to shoulder. Tramadol  given for pain and effective. UA is ordered and pt oliguric and had dialysis 1/1 evening. Pt achieving 250 on incentive spirometer.  Problem: Education: Goal: Ability to describe self-care measures that may prevent or decrease complications (Diabetes Survival Skills Education) will improve Outcome: Progressing Goal: Individualized Educational Video(s) Outcome: Progressing   Problem: Coping: Goal: Ability to adjust to condition or change in health will improve Outcome: Progressing   Problem: Fluid Volume: Goal: Ability to maintain a balanced intake and output will improve Outcome: Progressing   Problem: Health Behavior/Discharge Planning: Goal: Ability to identify and utilize available resources and services will improve Outcome: Progressing Goal: Ability to manage health-related needs will improve Outcome: Progressing   Problem: Metabolic: Goal: Ability to maintain appropriate glucose levels will improve Outcome: Progressing   Problem: Nutritional: Goal: Maintenance of adequate nutrition will improve Outcome: Progressing Goal: Progress toward achieving an optimal weight will improve Outcome: Progressing   Problem: Skin Integrity: Goal: Risk for impaired skin integrity will decrease Outcome: Progressing   Problem: Tissue Perfusion: Goal: Adequacy of tissue perfusion will improve Outcome: Progressing   Problem: Education: Goal: Knowledge of General Education information will improve Description: Including pain rating scale, medication(s)/side effects and non-pharmacologic comfort measures Outcome: Progressing   Problem: Health Behavior/Discharge Planning: Goal: Ability to manage health-related needs will improve Outcome: Progressing   Problem: Clinical Measurements: Goal: Ability to maintain clinical  measurements within normal limits will improve Outcome: Progressing Goal: Will remain free from infection Outcome: Progressing Goal: Diagnostic test results will improve Outcome: Progressing Goal: Respiratory complications will improve Outcome: Progressing Goal: Cardiovascular complication will be avoided Outcome: Progressing   Problem: Activity: Goal: Risk for activity intolerance will decrease Outcome: Progressing   Problem: Nutrition: Goal: Adequate nutrition will be maintained Outcome: Progressing   Problem: Coping: Goal: Level of anxiety will decrease Outcome: Progressing   Problem: Elimination: Goal: Will not experience complications related to bowel motility Outcome: Progressing Goal: Will not experience complications related to urinary retention Outcome: Progressing   Problem: Pain Management: Goal: General experience of comfort will improve Outcome: Progressing   Problem: Safety: Goal: Ability to remain free from injury will improve Outcome: Progressing   Problem: Skin Integrity: Goal: Risk for impaired skin integrity will decrease Outcome: Progressing

## 2024-01-21 NOTE — Progress Notes (Signed)
   2024/01/02 0400  Spiritual Encounters  Type of Visit Initial  Care provided to: Patient  Referral source Code page  Reason for visit Code  OnCall Visit Yes   Chaplain responded to Medical Alert Code Blue received at 0422. Chaplain remained present until time of death called at 48. Family repeatedly called but could not be reached. Chaplain services available.

## 2024-01-21 NOTE — Progress Notes (Signed)
 Received call back from Daughter Rosey Bath who was informed to come to hospital. Daughter updated on mother status and code blue. Daughter stated would  come to hospital at this time.

## 2024-01-21 NOTE — Progress Notes (Signed)
   01-16-2024 1200  Spiritual Encounters  Type of Visit Initial  Care provided to: Pt and family  Referral source Code page  Reason for visit Patient death  OnCall Visit Yes   Chaplain met with family in the [place] offering compassionate presence, empathy, prayer and  blessed patient with prayer post transition. Chaplain spiritual support services remain available as the need arises.

## 2024-01-21 NOTE — Plan of Care (Signed)
Ruth Walters

## 2024-01-21 NOTE — Progress Notes (Signed)
       CROSS COVER NOTE  NAME: DEA BITTING MRN: [id number] DOB : July 07, 1966 ATTENDING PHYSICIAN: Lenon Marien CROME, MD    Date of Service   2024-01-05   HPI/Events of Note   Patient with code blue called 0420. Responded to patietn who was found non responsive. CPR in progress. Instructed Jodie RN to manage meds, start fluids and admin 1 amp epinephrine. Dr Claudene from ER who arrived and took over code. Informed time of death 440 AM. Staff message sent to attending  Interventions          Erminio CROME [facility] NP [facility] Cross Cover 7pm-7am - check amion for availability Pager [phone]

## 2024-01-21 NOTE — Progress Notes (Signed)
 Called to bedside at 0420 CNA was completing routine vitals. Pt found unresponsive without pulse. Immediately Chest compressions started and code blue called. ED provider at bedside and directing code. Reported to provider. No significant changes during overnight. Pt alert and talking to staff. Had been drinking without complaints and taking medication whole. No distress noted. Pt instructed on how to use incentive spirometer and Had achieved 250 with incentive spirometer.

## 2024-01-21 DEATH — deceased
